# Patient Record
Sex: Female | Born: 1940
Health system: Southern US, Community
[De-identification: ages and names within clinical notes are randomized; demographics above are authoritative.]

## PROBLEM LIST (undated history)

## (undated) DIAGNOSIS — E119 Type 2 diabetes mellitus without complications: Secondary | ICD-10-CM

## (undated) DIAGNOSIS — I10 Essential (primary) hypertension: Secondary | ICD-10-CM

## (undated) HISTORY — PX: CATARACT EXTRACTION, BILATERAL: SHX1313

---

## 1999-07-09 ENCOUNTER — Ambulatory Visit: Admission: RE | Admit: 1999-07-09 | Discharge: 1999-07-09 | Payer: Self-pay | Admitting: Occupational Medicine

## 1999-07-09 ENCOUNTER — Encounter: Payer: Self-pay | Admitting: Occupational Medicine

## 2000-03-05 ENCOUNTER — Other Ambulatory Visit: Admission: RE | Admit: 2000-03-05 | Discharge: 2000-03-05 | Payer: Self-pay | Admitting: Obstetrics & Gynecology

## 2001-04-01 ENCOUNTER — Other Ambulatory Visit: Admission: RE | Admit: 2001-04-01 | Discharge: 2001-04-01 | Payer: Self-pay | Admitting: Obstetrics & Gynecology

## 2004-04-18 ENCOUNTER — Other Ambulatory Visit: Admission: RE | Admit: 2004-04-18 | Discharge: 2004-04-18 | Payer: Self-pay | Admitting: Obstetrics & Gynecology

## 2008-12-14 ENCOUNTER — Encounter: Admission: RE | Admit: 2008-12-14 | Discharge: 2008-12-14 | Payer: Self-pay | Admitting: Orthopedic Surgery

## 2009-01-11 ENCOUNTER — Ambulatory Visit (HOSPITAL_COMMUNITY): Admission: RE | Admit: 2009-01-11 | Discharge: 2009-01-11 | Payer: Self-pay | Admitting: Obstetrics and Gynecology

## 2009-01-11 ENCOUNTER — Encounter (INDEPENDENT_AMBULATORY_CARE_PROVIDER_SITE_OTHER): Payer: Self-pay | Admitting: Obstetrics and Gynecology

## 2010-04-19 ENCOUNTER — Encounter (INDEPENDENT_AMBULATORY_CARE_PROVIDER_SITE_OTHER): Payer: Self-pay | Admitting: *Deleted

## 2010-04-23 ENCOUNTER — Ambulatory Visit: Payer: Self-pay | Admitting: Internal Medicine

## 2010-04-23 ENCOUNTER — Encounter (INDEPENDENT_AMBULATORY_CARE_PROVIDER_SITE_OTHER): Payer: Self-pay | Admitting: *Deleted

## 2010-05-07 ENCOUNTER — Ambulatory Visit: Payer: Self-pay | Admitting: Internal Medicine

## 2010-05-08 ENCOUNTER — Encounter: Payer: Self-pay | Admitting: Internal Medicine

## 2010-08-20 NOTE — Letter (Signed)
Summary: Diabetic Instructions  [facility]  [address] [place], [place] [address]   Phone: [phone]  Fax: [phone]    Anna Cuevas 20-Feb-1941 MRN: [id number]   _  _   ORAL DIABETIC MEDICATION INSTRUCTIONS  The day before your procedure:   Take your diabetic pill as you do normally  The day of your procedure:   Do not take your diabetic pill    We will check your blood sugar levels during the admission process and again in Recovery before discharging you home  ________________________________________________________________________

## 2010-08-20 NOTE — Procedures (Signed)
Summary: Colonoscopy  Patient: [name] Note: All result statuses are Final unless otherwise noted.  Tests: (1) Colonoscopy (COL)   COL Colonoscopy           DONE     [facility]     [address]     [place], [place]  [address]           COLONOSCOPY PROCEDURE REPORT           PATIENT:  Anna, Cuevas  MR#:  [id number]     BIRTHDATE:  1941/01/19, 68 yrs. old  GENDER:  female     ENDOSCOPIST:  Hedwig Morton. Juanda Chance, MD     REF. BY:  Varney Baas, M.D.     PROCEDURE DATE:  05/07/2010     PROCEDURE:  Colonoscopy 29562     ASA CLASS:  Class I     INDICATIONS:  Routine Risk Screening     MEDICATIONS:   Versed 10 mg, Fentanyl 100 mcg           DESCRIPTION OF PROCEDURE:   After the risks benefits and     alternatives of the procedure were thoroughly explained, informed     consent was obtained.  Digital rectal exam was performed and     revealed no rectal masses.   The LB PCF-H180AL [id number] endoscope     was introduced through the anus and advanced to the cecum, which     was identified by both the appendix and ileocecal valve, without     limitations.  The quality of the prep was good, using MiraLax.     The instrument was then slowly withdrawn as the colon was fully     examined.     <<PROCEDUREIMAGES>>           FINDINGS:  A sessile polyp was found. 8 mm polyp at 30 cm Polyp     was snared without cautery. Retrieval was successful (see image3).     snare polyp  This was otherwise a normal examination of the colon     (see image4, image2, and image1).   Retroflexed views in the     rectum revealed no abnormalities.    The scope was then withdrawn     from the patient and the procedure completed.           COMPLICATIONS:  None     ENDOSCOPIC IMPRESSION:     1) Sessile polyp     2) Otherwise normal examination     s/p cold snare polypectomy     RECOMMENDATIONS:     1) Await pathology results     2) High fiber diet.     REPEAT EXAM:  No        ______________________________     Hedwig Morton. Juanda Chance, MD           CC:           n.     eSIGNED:   Hedwig Morton. Brodie at 05/07/2010 11:00 AM           [place], [id number]  Note: An exclamation mark (!) indicates a result that was not dispersed into the flowsheet. Document Creation Date: 05/07/2010 11:00 AM _______________________________________________________________________  (1) Order result status: Final Collection or observation date-time: 05/07/2010 10:51 Requested date-time:  Receipt date-time:  Reported date-time:  Referring Physician:   Ordering Physician: Lina Sar [phone]) Specimen Source:  Source: [name] Order Number: [id number] Lab site:   Appended Document: Colonoscopy  Correction: Recall colonoscopy in 5 years based on findings  of the adenomatous polyp.  Appended Document: Colonoscopy     Procedures Next Due Date:    Colonoscopy: 04/2015

## 2010-08-20 NOTE — Miscellaneous (Signed)
Summary: [facility]  Clinical Lists Changes  Medications: Added new medication of MIRALAX   POWD (POLYETHYLENE GLYCOL 3350) As per prep  instructions. - Signed Added new medication of DULCOLAX 5 MG  TBEC (BISACODYL) Day before procedure take 2 at 3pm and 2 at 8pm. - Signed Added new medication of REGLAN 10 MG  TABS (METOCLOPRAMIDE HCL) As per prep instructions. - Signed Rx of MIRALAX   POWD (POLYETHYLENE GLYCOL 3350) As per prep  instructions.;  #255gm x 0;  Signed;  Entered by: Ezra Sites RN;  Authorized by: Hart Carwin MD;  Method used: Electronically to [facility] [phone] [place], [place], [place]  [address], Ph: [phone], Fax: [phone] Rx of DULCOLAX 5 MG  TBEC (BISACODYL) Day before procedure take 2 at 3pm and 2 at 8pm.;  #4 x 0;  Signed;  Entered by: Ezra Sites RN;  Authorized by: Hart Carwin MD;  Method used: Electronically to [facility] [phone] [place], [place], [place]  [address], Ph: [phone], Fax: [phone] Rx of REGLAN 10 MG  TABS (METOCLOPRAMIDE HCL) As per prep instructions.;  #2 x 0;  Signed;  Entered by: Ezra Sites RN;  Authorized by: Hart Carwin MD;  Method used: Electronically to [facility] [phone] [place], [place], [place]  [address], Ph: [phone], Fax: [phone] Observations: Added new observation of NKA: T (04/23/2010 8:41)    Prescriptions: REGLAN 10 MG  TABS (METOCLOPRAMIDE HCL) As per prep instructions.  #2 x 0   Entered by:   Ezra Sites RN   Authorized by:   Hart Carwin MD   Signed by:   Ezra Sites RN on 04/23/2010   Method used:   Electronically to        [facility] [address] (retail)       [phone]       [place], [place]  [address]       Ph: [phone]       Fax: [phone]   RxID:   [id number] DULCOLAX 5 MG  TBEC (BISACODYL) Day before procedure take 2 at 3pm and 2 at 8pm.   #4 x 0   Entered by:   Ezra Sites RN   Authorized by:   Hart Carwin MD   Signed by:   Ezra Sites RN on 04/23/2010   Method used:   Electronically to        [facility] [address] (retail)       [phone]       [place], [place]  [address]       Ph: [phone]       Fax: [phone]   RxID:   [id number] MIRALAX   POWD (POLYETHYLENE GLYCOL 3350) As per prep  instructions.  #255gm x 0   Entered by:   Ezra Sites RN   Authorized by:   Hart Carwin MD   Signed by:   Ezra Sites RN on 04/23/2010   Method used:   Electronically to        [facility] [address] (retail)       [phone]       [place], [place]  [address]       Ph: [phone]       Fax: [phone]  RxID:   [id number]

## 2010-08-20 NOTE — Letter (Signed)
Summary: [facility] Instructions  [facility]  [address] [place], [place] [address]   Phone: [phone]  Fax: [phone]       Reannah HACKER    11-10-1940    MRN: [id number]       Procedure Day /Date:  Tuesday 05/07/2010     Arrival Time: 9:00 am     Procedure Time: 10:00 am     Location of Procedure:                    _x _  [facility] (4th Floor)    PREPARATION FOR COLONOSCOPY WITH MIRALAX  Starting 5 days prior to your procedure Thursday 10/13 do not eat nuts, seeds, popcorn, corn, beans, peas,  salads, or any raw vegetables.  Do not take any fiber supplements (e.g. Metamucil, Citrucel, and Benefiber). ____________________________________________________________________________________________________   THE DAY BEFORE YOUR PROCEDURE         DATE: Monday 10/17  1   Drink clear liquids the entire day-NO SOLID FOOD  2   Do not drink anything colored red or purple.  Avoid juices with pulp.  No orange juice.  3   Drink at least 64 oz. (8 glasses) of fluid/clear liquids during the day to prevent dehydration and help the prep work efficiently.  CLEAR LIQUIDS INCLUDE: Water Jello Ice Popsicles Tea (sugar ok, no milk/cream) Powdered fruit flavored drinks Coffee (sugar ok, no milk/cream) Gatorade Juice: apple, white grape, white cranberry  Lemonade Clear bullion, consomm, broth Carbonated beverages (any kind) Strained chicken noodle soup Hard Candy  4   Mix the entire bottle of Miralax with 64 oz. of Gatorade/Powerade in the morning and put in the refrigerator to chill.  5   At 3:00 pm take 2 Dulcolax/Bisacodyl tablets.  6   At 4:30 pm take one Reglan/Metoclopramide tablet.  7  Starting at 5:00 pm drink one 8 oz glass of the Miralax mixture every 15-20 minutes until you have finished drinking the entire 64 oz.  You should finish drinking prep around 7:30 or 8:00 pm.  8   If you are nauseated, you may take the 2nd Reglan/Metoclopramide  tablet at 6:30 pm.        9    At 8:00 pm take 2 more DULCOLAX/Bisacodyl tablets.     THE DAY OF YOUR PROCEDURE      DATE:  Tuesday 10/18  You may drink clear liquids until 8:00 am   (2 HOURS BEFORE PROCEDURE).   MEDICATION INSTRUCTIONS  Unless otherwise instructed, you should take regular prescription medications with a small sip of water as early as possible the morning of your procedure.  Diabetic patients - see separate instructions.   Additional medication instructions: Do not take Quinoretic/HCT day of procedure.         OTHER INSTRUCTIONS  You will need a responsible adult at least 71 years of age to accompany you and drive you home.   This person must remain in the waiting room during your procedure.  Wear loose fitting clothing that is easily removed.  Leave jewelry and other valuables at home.  However, you may wish to bring a book to read or an iPod/MP3 player to listen to music as you wait for your procedure to start.  Remove all body piercing jewelry and leave at home.  Total time from sign-in until discharge is approximately 2-3 hours.  You should go home directly after your procedure and rest.  You can resume normal activities  the day after your procedure.  The day of your procedure you should not:   Drive   Make legal decisions   Operate machinery   Drink alcohol   Return to work  You will receive specific instructions about eating, activities and medications before you leave.   The above instructions have been reviewed and explained to me by   Ezra Sites RN  April 23, 2010 9:31 AM    I fully understand and can verbalize these instructions _____________________________ Date _______

## 2010-08-20 NOTE — Letter (Signed)
Summary: Patient Notice- Polyp Results  [facility]  [address] [place], [place] [address]   Phone: [phone]  Fax: [phone]        May 08, 2010 MRN: [id number]    Anna Cuevas [facility] [address] [place], [place]  [address]    Dear [name],  I am pleased to inform you that the colon polyp(s) removed during your recent colonoscopy was (were) found to be benign (no cancer detected) upon pathologic examination.Your polyps was adenomatous ( precancerous).  I recommend you have a repeat colonoscopy examination in 5_ years to look for recurrent polyps, as having colon polyps increases your risk for having recurrent polyps or even colon cancer in the future.  Should you develop new or worsening symptoms of abdominal pain, bowel habit changes or bleeding from the rectum or bowels, please schedule an evaluation with either your primary care physician or with me.  Additional information/recommendations:  _x_ No further action with gastroenterology is needed at this time. Please      follow-up with your primary care physician for your other healthcare      needs.  __ Please call [phone] to schedule a return visit to review your      situation.  __ Please keep your follow-up visit as already scheduled.  __ Continue treatment plan as outlined the day of your exam.  Please call us if you are having persistent problems or have questions about your condition that have not been fully answered at this time.  Sincerely,  Hart Carwin MD  This letter has been electronically signed by your physician.  Appended Document: Patient Notice- Polyp Results letter mailed

## 2010-10-02 LAB — GLUCOSE, CAPILLARY
Glucose-Capillary: 123 mg/dL — ABNORMAL HIGH (ref 70–99)
Glucose-Capillary: 149 mg/dL — ABNORMAL HIGH (ref 70–99)

## 2014-03-03 ENCOUNTER — Encounter: Payer: Self-pay | Admitting: Internal Medicine

## 2015-04-23 ENCOUNTER — Other Ambulatory Visit: Payer: Self-pay | Admitting: Obstetrics and Gynecology

## 2015-04-24 LAB — CYTOLOGY - PAP

## 2015-05-09 ENCOUNTER — Encounter: Payer: Self-pay | Admitting: Gastroenterology

## 2015-08-17 DIAGNOSIS — M859 Disorder of bone density and structure, unspecified: Secondary | ICD-10-CM | POA: Diagnosis not present

## 2015-08-17 DIAGNOSIS — N183 Chronic kidney disease, stage 3 (moderate): Secondary | ICD-10-CM | POA: Diagnosis not present

## 2015-08-17 DIAGNOSIS — Z7984 Long term (current) use of oral hypoglycemic drugs: Secondary | ICD-10-CM | POA: Diagnosis not present

## 2015-08-17 DIAGNOSIS — E782 Mixed hyperlipidemia: Secondary | ICD-10-CM | POA: Diagnosis not present

## 2015-08-17 DIAGNOSIS — I10 Essential (primary) hypertension: Secondary | ICD-10-CM | POA: Diagnosis not present

## 2015-08-17 DIAGNOSIS — E6609 Other obesity due to excess calories: Secondary | ICD-10-CM | POA: Diagnosis not present

## 2015-08-17 DIAGNOSIS — E119 Type 2 diabetes mellitus without complications: Secondary | ICD-10-CM | POA: Diagnosis not present

## 2015-08-17 DIAGNOSIS — F3342 Major depressive disorder, recurrent, in full remission: Secondary | ICD-10-CM | POA: Diagnosis not present

## 2016-02-18 DIAGNOSIS — E782 Mixed hyperlipidemia: Secondary | ICD-10-CM | POA: Diagnosis not present

## 2016-02-18 DIAGNOSIS — N183 Chronic kidney disease, stage 3 (moderate): Secondary | ICD-10-CM | POA: Diagnosis not present

## 2016-02-18 DIAGNOSIS — Z86010 Personal history of colonic polyps: Secondary | ICD-10-CM | POA: Diagnosis not present

## 2016-02-18 DIAGNOSIS — Z6836 Body mass index (BMI) 36.0-36.9, adult: Secondary | ICD-10-CM | POA: Diagnosis not present

## 2016-02-18 DIAGNOSIS — F3342 Major depressive disorder, recurrent, in full remission: Secondary | ICD-10-CM | POA: Diagnosis not present

## 2016-02-18 DIAGNOSIS — I10 Essential (primary) hypertension: Secondary | ICD-10-CM | POA: Diagnosis not present

## 2016-02-18 DIAGNOSIS — E119 Type 2 diabetes mellitus without complications: Secondary | ICD-10-CM | POA: Diagnosis not present

## 2016-02-18 DIAGNOSIS — E6609 Other obesity due to excess calories: Secondary | ICD-10-CM | POA: Diagnosis not present

## 2016-02-18 DIAGNOSIS — Z7984 Long term (current) use of oral hypoglycemic drugs: Secondary | ICD-10-CM | POA: Diagnosis not present

## 2016-05-09 DIAGNOSIS — R531 Weakness: Secondary | ICD-10-CM | POA: Diagnosis not present

## 2016-05-09 DIAGNOSIS — R42 Dizziness and giddiness: Secondary | ICD-10-CM | POA: Diagnosis not present

## 2016-05-09 DIAGNOSIS — J329 Chronic sinusitis, unspecified: Secondary | ICD-10-CM | POA: Diagnosis not present

## 2016-05-12 DIAGNOSIS — R899 Unspecified abnormal finding in specimens from other organs, systems and tissues: Secondary | ICD-10-CM | POA: Diagnosis not present

## 2016-06-19 DIAGNOSIS — M5441 Lumbago with sciatica, right side: Secondary | ICD-10-CM | POA: Diagnosis not present

## 2016-06-27 DIAGNOSIS — M5441 Lumbago with sciatica, right side: Secondary | ICD-10-CM | POA: Diagnosis not present

## 2016-07-09 DIAGNOSIS — M5441 Lumbago with sciatica, right side: Secondary | ICD-10-CM | POA: Diagnosis not present

## 2016-07-29 DIAGNOSIS — F4320 Adjustment disorder, unspecified: Secondary | ICD-10-CM | POA: Diagnosis not present

## 2016-07-29 DIAGNOSIS — F3342 Major depressive disorder, recurrent, in full remission: Secondary | ICD-10-CM | POA: Diagnosis not present

## 2016-07-30 DIAGNOSIS — E119 Type 2 diabetes mellitus without complications: Secondary | ICD-10-CM | POA: Diagnosis not present

## 2016-07-30 DIAGNOSIS — H26492 Other secondary cataract, left eye: Secondary | ICD-10-CM | POA: Diagnosis not present

## 2016-07-30 DIAGNOSIS — H02831 Dermatochalasis of right upper eyelid: Secondary | ICD-10-CM | POA: Diagnosis not present

## 2016-07-30 DIAGNOSIS — H1851 Endothelial corneal dystrophy: Secondary | ICD-10-CM | POA: Diagnosis not present

## 2016-08-22 DIAGNOSIS — M5441 Lumbago with sciatica, right side: Secondary | ICD-10-CM | POA: Diagnosis not present

## 2016-09-01 DIAGNOSIS — M47816 Spondylosis without myelopathy or radiculopathy, lumbar region: Secondary | ICD-10-CM | POA: Diagnosis not present

## 2016-09-01 DIAGNOSIS — M533 Sacrococcygeal disorders, not elsewhere classified: Secondary | ICD-10-CM | POA: Diagnosis not present

## 2016-09-16 DIAGNOSIS — M533 Sacrococcygeal disorders, not elsewhere classified: Secondary | ICD-10-CM | POA: Diagnosis not present

## 2016-09-16 DIAGNOSIS — M47816 Spondylosis without myelopathy or radiculopathy, lumbar region: Secondary | ICD-10-CM | POA: Diagnosis not present

## 2016-10-06 DIAGNOSIS — Z79899 Other long term (current) drug therapy: Secondary | ICD-10-CM | POA: Diagnosis not present

## 2016-10-06 DIAGNOSIS — E119 Type 2 diabetes mellitus without complications: Secondary | ICD-10-CM | POA: Diagnosis not present

## 2016-10-06 DIAGNOSIS — R899 Unspecified abnormal finding in specimens from other organs, systems and tissues: Secondary | ICD-10-CM | POA: Diagnosis not present

## 2016-10-06 DIAGNOSIS — Z7984 Long term (current) use of oral hypoglycemic drugs: Secondary | ICD-10-CM | POA: Diagnosis not present

## 2016-10-06 DIAGNOSIS — N183 Chronic kidney disease, stage 3 (moderate): Secondary | ICD-10-CM | POA: Diagnosis not present

## 2016-10-06 DIAGNOSIS — E782 Mixed hyperlipidemia: Secondary | ICD-10-CM | POA: Diagnosis not present

## 2016-10-07 DIAGNOSIS — Z79899 Other long term (current) drug therapy: Secondary | ICD-10-CM | POA: Diagnosis not present

## 2016-10-07 DIAGNOSIS — F3342 Major depressive disorder, recurrent, in full remission: Secondary | ICD-10-CM | POA: Diagnosis not present

## 2016-10-07 DIAGNOSIS — E6609 Other obesity due to excess calories: Secondary | ICD-10-CM | POA: Diagnosis not present

## 2016-10-07 DIAGNOSIS — E119 Type 2 diabetes mellitus without complications: Secondary | ICD-10-CM | POA: Diagnosis not present

## 2016-10-07 DIAGNOSIS — N183 Chronic kidney disease, stage 3 (moderate): Secondary | ICD-10-CM | POA: Diagnosis not present

## 2016-10-07 DIAGNOSIS — Z7984 Long term (current) use of oral hypoglycemic drugs: Secondary | ICD-10-CM | POA: Diagnosis not present

## 2016-10-07 DIAGNOSIS — E782 Mixed hyperlipidemia: Secondary | ICD-10-CM | POA: Diagnosis not present

## 2016-10-07 DIAGNOSIS — Z6837 Body mass index (BMI) 37.0-37.9, adult: Secondary | ICD-10-CM | POA: Diagnosis not present

## 2016-10-14 DIAGNOSIS — M533 Sacrococcygeal disorders, not elsewhere classified: Secondary | ICD-10-CM | POA: Diagnosis not present

## 2016-10-14 DIAGNOSIS — M47816 Spondylosis without myelopathy or radiculopathy, lumbar region: Secondary | ICD-10-CM | POA: Diagnosis not present

## 2016-10-29 DIAGNOSIS — M47816 Spondylosis without myelopathy or radiculopathy, lumbar region: Secondary | ICD-10-CM | POA: Diagnosis not present

## 2016-11-11 DIAGNOSIS — M47816 Spondylosis without myelopathy or radiculopathy, lumbar region: Secondary | ICD-10-CM | POA: Diagnosis not present

## 2016-11-11 DIAGNOSIS — M47818 Spondylosis without myelopathy or radiculopathy, sacral and sacrococcygeal region: Secondary | ICD-10-CM | POA: Diagnosis not present

## 2016-12-05 DIAGNOSIS — M47816 Spondylosis without myelopathy or radiculopathy, lumbar region: Secondary | ICD-10-CM | POA: Diagnosis not present

## 2016-12-05 DIAGNOSIS — M47818 Spondylosis without myelopathy or radiculopathy, sacral and sacrococcygeal region: Secondary | ICD-10-CM | POA: Diagnosis not present

## 2017-01-02 DIAGNOSIS — Z7984 Long term (current) use of oral hypoglycemic drugs: Secondary | ICD-10-CM | POA: Diagnosis not present

## 2017-01-02 DIAGNOSIS — H6122 Impacted cerumen, left ear: Secondary | ICD-10-CM | POA: Diagnosis not present

## 2017-01-02 DIAGNOSIS — N183 Chronic kidney disease, stage 3 (moderate): Secondary | ICD-10-CM | POA: Diagnosis not present

## 2017-01-02 DIAGNOSIS — E119 Type 2 diabetes mellitus without complications: Secondary | ICD-10-CM | POA: Diagnosis not present

## 2017-01-02 DIAGNOSIS — H6120 Impacted cerumen, unspecified ear: Secondary | ICD-10-CM | POA: Diagnosis not present

## 2017-01-02 DIAGNOSIS — F3342 Major depressive disorder, recurrent, in full remission: Secondary | ICD-10-CM | POA: Diagnosis not present

## 2017-01-02 DIAGNOSIS — E6609 Other obesity due to excess calories: Secondary | ICD-10-CM | POA: Diagnosis not present

## 2017-01-02 DIAGNOSIS — E782 Mixed hyperlipidemia: Secondary | ICD-10-CM | POA: Diagnosis not present

## 2017-01-02 DIAGNOSIS — Z79899 Other long term (current) drug therapy: Secondary | ICD-10-CM | POA: Diagnosis not present

## 2017-01-12 DIAGNOSIS — R29898 Other symptoms and signs involving the musculoskeletal system: Secondary | ICD-10-CM | POA: Diagnosis not present

## 2017-01-16 DIAGNOSIS — R29898 Other symptoms and signs involving the musculoskeletal system: Secondary | ICD-10-CM | POA: Diagnosis not present

## 2017-01-20 DIAGNOSIS — R29898 Other symptoms and signs involving the musculoskeletal system: Secondary | ICD-10-CM | POA: Diagnosis not present

## 2017-01-23 DIAGNOSIS — R29898 Other symptoms and signs involving the musculoskeletal system: Secondary | ICD-10-CM | POA: Diagnosis not present

## 2017-01-27 DIAGNOSIS — R29898 Other symptoms and signs involving the musculoskeletal system: Secondary | ICD-10-CM | POA: Diagnosis not present

## 2017-01-29 DIAGNOSIS — R29898 Other symptoms and signs involving the musculoskeletal system: Secondary | ICD-10-CM | POA: Diagnosis not present

## 2017-02-10 DIAGNOSIS — R29898 Other symptoms and signs involving the musculoskeletal system: Secondary | ICD-10-CM | POA: Diagnosis not present

## 2017-02-13 DIAGNOSIS — R29898 Other symptoms and signs involving the musculoskeletal system: Secondary | ICD-10-CM | POA: Diagnosis not present

## 2017-02-17 DIAGNOSIS — R29898 Other symptoms and signs involving the musculoskeletal system: Secondary | ICD-10-CM | POA: Diagnosis not present

## 2017-02-20 DIAGNOSIS — R29898 Other symptoms and signs involving the musculoskeletal system: Secondary | ICD-10-CM | POA: Diagnosis not present

## 2017-02-25 DIAGNOSIS — R29898 Other symptoms and signs involving the musculoskeletal system: Secondary | ICD-10-CM | POA: Diagnosis not present

## 2017-02-27 DIAGNOSIS — R29898 Other symptoms and signs involving the musculoskeletal system: Secondary | ICD-10-CM | POA: Diagnosis not present

## 2017-03-02 DIAGNOSIS — R29898 Other symptoms and signs involving the musculoskeletal system: Secondary | ICD-10-CM | POA: Diagnosis not present

## 2017-03-04 DIAGNOSIS — R29898 Other symptoms and signs involving the musculoskeletal system: Secondary | ICD-10-CM | POA: Diagnosis not present

## 2017-04-14 DIAGNOSIS — E6609 Other obesity due to excess calories: Secondary | ICD-10-CM | POA: Diagnosis not present

## 2017-04-14 DIAGNOSIS — N183 Chronic kidney disease, stage 3 (moderate): Secondary | ICD-10-CM | POA: Diagnosis not present

## 2017-04-14 DIAGNOSIS — Z6836 Body mass index (BMI) 36.0-36.9, adult: Secondary | ICD-10-CM | POA: Diagnosis not present

## 2017-04-14 DIAGNOSIS — I10 Essential (primary) hypertension: Secondary | ICD-10-CM | POA: Diagnosis not present

## 2017-04-14 DIAGNOSIS — E119 Type 2 diabetes mellitus without complications: Secondary | ICD-10-CM | POA: Diagnosis not present

## 2017-04-14 DIAGNOSIS — Z7984 Long term (current) use of oral hypoglycemic drugs: Secondary | ICD-10-CM | POA: Diagnosis not present

## 2017-04-14 DIAGNOSIS — F3342 Major depressive disorder, recurrent, in full remission: Secondary | ICD-10-CM | POA: Diagnosis not present

## 2017-04-14 DIAGNOSIS — E782 Mixed hyperlipidemia: Secondary | ICD-10-CM | POA: Diagnosis not present

## 2017-04-14 DIAGNOSIS — Z23 Encounter for immunization: Secondary | ICD-10-CM | POA: Diagnosis not present

## 2017-08-03 DIAGNOSIS — M47816 Spondylosis without myelopathy or radiculopathy, lumbar region: Secondary | ICD-10-CM | POA: Diagnosis not present

## 2017-08-18 DIAGNOSIS — M47818 Spondylosis without myelopathy or radiculopathy, sacral and sacrococcygeal region: Secondary | ICD-10-CM | POA: Diagnosis not present

## 2017-08-18 DIAGNOSIS — M47816 Spondylosis without myelopathy or radiculopathy, lumbar region: Secondary | ICD-10-CM | POA: Diagnosis not present

## 2017-09-15 DIAGNOSIS — M47818 Spondylosis without myelopathy or radiculopathy, sacral and sacrococcygeal region: Secondary | ICD-10-CM | POA: Diagnosis not present

## 2017-09-15 DIAGNOSIS — M47816 Spondylosis without myelopathy or radiculopathy, lumbar region: Secondary | ICD-10-CM | POA: Diagnosis not present

## 2017-10-15 ENCOUNTER — Other Ambulatory Visit: Payer: Self-pay

## 2017-10-15 ENCOUNTER — Inpatient Hospital Stay (HOSPITAL_COMMUNITY): Payer: PPO

## 2017-10-15 ENCOUNTER — Emergency Department (HOSPITAL_COMMUNITY): Payer: PPO

## 2017-10-15 ENCOUNTER — Inpatient Hospital Stay (HOSPITAL_COMMUNITY)
Admission: EM | Admit: 2017-10-15 | Discharge: 2017-10-20 | DRG: 309 | Disposition: A | Discharge status: Home Health | Payer: PPO | Attending: Family Medicine | Admitting: Family Medicine

## 2017-10-15 ENCOUNTER — Other Ambulatory Visit (HOSPITAL_COMMUNITY): Payer: PPO

## 2017-10-15 ENCOUNTER — Encounter (HOSPITAL_COMMUNITY): Payer: Self-pay | Admitting: Emergency Medicine

## 2017-10-15 DIAGNOSIS — R4182 Altered mental status, unspecified: Secondary | ICD-10-CM | POA: Diagnosis not present

## 2017-10-15 DIAGNOSIS — L89151 Pressure ulcer of sacral region, stage 1: Secondary | ICD-10-CM | POA: Diagnosis not present

## 2017-10-15 DIAGNOSIS — R112 Nausea with vomiting, unspecified: Secondary | ICD-10-CM | POA: Diagnosis not present

## 2017-10-15 DIAGNOSIS — Z7982 Long term (current) use of aspirin: Secondary | ICD-10-CM

## 2017-10-15 DIAGNOSIS — R339 Retention of urine, unspecified: Secondary | ICD-10-CM | POA: Diagnosis not present

## 2017-10-15 DIAGNOSIS — L8990 Pressure ulcer of unspecified site, unspecified stage: Secondary | ICD-10-CM

## 2017-10-15 DIAGNOSIS — R531 Weakness: Secondary | ICD-10-CM | POA: Diagnosis not present

## 2017-10-15 DIAGNOSIS — I4891 Unspecified atrial fibrillation: Principal | ICD-10-CM | POA: Diagnosis present

## 2017-10-15 DIAGNOSIS — E119 Type 2 diabetes mellitus without complications: Secondary | ICD-10-CM

## 2017-10-15 DIAGNOSIS — L89322 Pressure ulcer of left buttock, stage 2: Secondary | ICD-10-CM | POA: Diagnosis not present

## 2017-10-15 DIAGNOSIS — E860 Dehydration: Secondary | ICD-10-CM | POA: Diagnosis not present

## 2017-10-15 DIAGNOSIS — N179 Acute kidney failure, unspecified: Secondary | ICD-10-CM | POA: Diagnosis not present

## 2017-10-15 DIAGNOSIS — D649 Anemia, unspecified: Secondary | ICD-10-CM | POA: Diagnosis not present

## 2017-10-15 DIAGNOSIS — I361 Nonrheumatic tricuspid (valve) insufficiency: Secondary | ICD-10-CM | POA: Diagnosis not present

## 2017-10-15 DIAGNOSIS — F329 Major depressive disorder, single episode, unspecified: Secondary | ICD-10-CM | POA: Diagnosis not present

## 2017-10-15 DIAGNOSIS — Z7984 Long term (current) use of oral hypoglycemic drugs: Secondary | ICD-10-CM | POA: Diagnosis not present

## 2017-10-15 DIAGNOSIS — R404 Transient alteration of awareness: Secondary | ICD-10-CM | POA: Diagnosis not present

## 2017-10-15 DIAGNOSIS — E871 Hypo-osmolality and hyponatremia: Secondary | ICD-10-CM | POA: Diagnosis present

## 2017-10-15 DIAGNOSIS — Z79899 Other long term (current) drug therapy: Secondary | ICD-10-CM | POA: Diagnosis not present

## 2017-10-15 DIAGNOSIS — R197 Diarrhea, unspecified: Secondary | ICD-10-CM | POA: Diagnosis not present

## 2017-10-15 DIAGNOSIS — I10 Essential (primary) hypertension: Secondary | ICD-10-CM | POA: Diagnosis not present

## 2017-10-15 HISTORY — DX: Type 2 diabetes mellitus without complications: E11.9

## 2017-10-15 HISTORY — DX: Essential (primary) hypertension: I10

## 2017-10-15 LAB — CBC WITH DIFFERENTIAL/PLATELET
BASOS PCT: 0 %
Basophils Absolute: 0 10*3/uL (ref 0.0–0.1)
Eosinophils Absolute: 0.1 10*3/uL (ref 0.0–0.7)
Eosinophils Relative: 0 %
HEMATOCRIT: 26.6 % — AB (ref 36.0–46.0)
Hemoglobin: 9 g/dL — ABNORMAL LOW (ref 12.0–15.0)
Lymphocytes Relative: 7 %
Lymphs Abs: 1.1 10*3/uL (ref 0.7–4.0)
MCH: 28 pg (ref 26.0–34.0)
MCHC: 33.8 g/dL (ref 30.0–36.0)
MCV: 82.6 fL (ref 78.0–100.0)
MONO ABS: 0.8 10*3/uL (ref 0.1–1.0)
MONOS PCT: 4 %
NEUTROS ABS: 15.4 10*3/uL — AB (ref 1.7–7.7)
Neutrophils Relative %: 89 %
Platelets: 488 10*3/uL — ABNORMAL HIGH (ref 150–400)
RBC: 3.22 MIL/uL — ABNORMAL LOW (ref 3.87–5.11)
RDW: 13.9 % (ref 11.5–15.5)
WBC: 17.3 10*3/uL — ABNORMAL HIGH (ref 4.0–10.5)

## 2017-10-15 LAB — VITAMIN B12: VITAMIN B 12: 997 pg/mL — AB (ref 180–914)

## 2017-10-15 LAB — RETICULOCYTES
RBC.: 3.1 MIL/uL — AB (ref 3.87–5.11)
RETIC COUNT ABSOLUTE: 52.7 10*3/uL (ref 19.0–186.0)
Retic Ct Pct: 1.7 % (ref 0.4–3.1)

## 2017-10-15 LAB — COMPREHENSIVE METABOLIC PANEL
ALK PHOS: 133 U/L — AB (ref 38–126)
ALT: 38 U/L (ref 14–54)
AST: 32 U/L (ref 15–41)
Albumin: 2.9 g/dL — ABNORMAL LOW (ref 3.5–5.0)
Anion gap: 13 (ref 5–15)
BILIRUBIN TOTAL: 0.5 mg/dL (ref 0.3–1.2)
BUN: 92 mg/dL — AB (ref 6–20)
CO2: 27 mmol/L (ref 22–32)
Calcium: 8.3 mg/dL — ABNORMAL LOW (ref 8.9–10.3)
Chloride: 84 mmol/L — ABNORMAL LOW (ref 101–111)
Creatinine, Ser: 1.43 mg/dL — ABNORMAL HIGH (ref 0.44–1.00)
GFR calc Af Amer: 40 mL/min — ABNORMAL LOW (ref >60)
GFR, EST NON AFRICAN AMERICAN: 34 mL/min — AB (ref >60)
Glucose, Bld: 314 mg/dL — ABNORMAL HIGH (ref 65–99)
Potassium: 3.6 mmol/L (ref 3.5–5.1)
Sodium: 124 mmol/L — ABNORMAL LOW (ref 135–145)
Total Protein: 6.1 g/dL — ABNORMAL LOW (ref 6.5–8.1)

## 2017-10-15 LAB — PHOSPHORUS: PHOSPHORUS: 2.9 mg/dL (ref 2.5–4.6)

## 2017-10-15 LAB — BRAIN NATRIURETIC PEPTIDE: B Natriuretic Peptide: 155.3 pg/mL — ABNORMAL HIGH (ref 0.0–100.0)

## 2017-10-15 LAB — URINALYSIS, ROUTINE W REFLEX MICROSCOPIC
Bilirubin Urine: NEGATIVE
GLUCOSE, UA: NEGATIVE mg/dL
HGB URINE DIPSTICK: NEGATIVE
Ketones, ur: NEGATIVE mg/dL
NITRITE: NEGATIVE
PH: 5 (ref 5.0–8.0)
Protein, ur: NEGATIVE mg/dL
Specific Gravity, Urine: 1.015 (ref 1.005–1.030)

## 2017-10-15 LAB — HEMOGLOBIN AND HEMATOCRIT, BLOOD
HEMATOCRIT: 23.7 % — AB (ref 36.0–46.0)
HEMOGLOBIN: 7.9 g/dL — AB (ref 12.0–15.0)

## 2017-10-15 LAB — I-STAT TROPONIN, ED: Troponin i, poc: 0.02 ng/mL (ref 0.00–0.08)

## 2017-10-15 LAB — I-STAT CHEM 8, ED
BUN: 92 mg/dL — ABNORMAL HIGH (ref 6–20)
CALCIUM ION: 1.07 mmol/L — AB (ref 1.15–1.40)
CHLORIDE: 82 mmol/L — AB (ref 101–111)
Creatinine, Ser: 1.4 mg/dL — ABNORMAL HIGH (ref 0.44–1.00)
Glucose, Bld: 306 mg/dL — ABNORMAL HIGH (ref 65–99)
HCT: 27 % — ABNORMAL LOW (ref 36.0–46.0)
Hemoglobin: 9.2 g/dL — ABNORMAL LOW (ref 12.0–15.0)
POTASSIUM: 3.6 mmol/L (ref 3.5–5.1)
SODIUM: 124 mmol/L — AB (ref 135–145)
TCO2: 29 mmol/L (ref 22–32)

## 2017-10-15 LAB — BASIC METABOLIC PANEL
ANION GAP: 13 (ref 5–15)
BUN: 78 mg/dL — ABNORMAL HIGH (ref 6–20)
CALCIUM: 8.5 mg/dL — AB (ref 8.9–10.3)
CO2: 25 mmol/L (ref 22–32)
Chloride: 90 mmol/L — ABNORMAL LOW (ref 101–111)
Creatinine, Ser: 1.12 mg/dL — ABNORMAL HIGH (ref 0.44–1.00)
GFR, EST AFRICAN AMERICAN: 53 mL/min — AB (ref >60)
GFR, EST NON AFRICAN AMERICAN: 46 mL/min — AB (ref >60)
GLUCOSE: 209 mg/dL — AB (ref 65–99)
POTASSIUM: 3.4 mmol/L — AB (ref 3.5–5.1)
Sodium: 128 mmol/L — ABNORMAL LOW (ref 135–145)

## 2017-10-15 LAB — FERRITIN: Ferritin: 216 ng/mL (ref 11–307)

## 2017-10-15 LAB — IRON AND TIBC
IRON: 35 ug/dL (ref 28–170)
Saturation Ratios: 13 % (ref 10.4–31.8)
TIBC: 263 ug/dL (ref 250–450)
UIBC: 228 ug/dL

## 2017-10-15 LAB — FOLATE: Folate: 29 ng/mL (ref >5.9)

## 2017-10-15 LAB — CBG MONITORING, ED
GLUCOSE-CAPILLARY: 152 mg/dL — AB (ref 65–99)
GLUCOSE-CAPILLARY: 200 mg/dL — AB (ref 65–99)

## 2017-10-15 LAB — TYPE AND SCREEN
ABO/RH(D): O POS
Antibody Screen: NEGATIVE

## 2017-10-15 LAB — POC OCCULT BLOOD, ED: FECAL OCCULT BLD: NEGATIVE

## 2017-10-15 LAB — LIPASE, BLOOD: LIPASE: 61 U/L — AB (ref 11–51)

## 2017-10-15 LAB — TROPONIN I: Troponin I: <0.03 ng/mL (ref <0.03)

## 2017-10-15 LAB — I-STAT CG4 LACTIC ACID, ED: Lactic Acid, Venous: 1.18 mmol/L (ref 0.5–1.9)

## 2017-10-15 LAB — MAGNESIUM: Magnesium: 1.7 mg/dL (ref 1.7–2.4)

## 2017-10-15 LAB — ABO/RH: ABO/RH(D): O POS

## 2017-10-15 LAB — PROTIME-INR
INR: 1.04
Prothrombin Time: 13.5 seconds (ref 11.4–15.2)

## 2017-10-15 MED ORDER — SODIUM CHLORIDE 0.9 % IV SOLN
1.0000 g | Freq: Once | INTRAVENOUS | Status: AC
  Administered 2017-10-15: 1 g via INTRAVENOUS
  Filled 2017-10-15: qty 10

## 2017-10-15 MED ORDER — SODIUM CHLORIDE 0.9 % IV SOLN
Freq: Once | INTRAVENOUS | Status: AC
  Administered 2017-10-15: 1000 mL via INTRAVENOUS

## 2017-10-15 MED ORDER — SERTRALINE HCL 100 MG PO TABS
200.0000 mg | ORAL_TABLET | Freq: Every day | ORAL | Status: DC
  Administered 2017-10-16 – 2017-10-20 (×5): 200 mg via ORAL
  Filled 2017-10-15 (×5): qty 2

## 2017-10-15 MED ORDER — ONDANSETRON HCL 4 MG PO TABS: 4.0000 mg | ORAL_TABLET | Freq: Four times a day (QID) | ORAL | Status: DC | PRN

## 2017-10-15 MED ORDER — HYDRALAZINE HCL 20 MG/ML IJ SOLN: 10.0000 mg | Freq: Three times a day (TID) | INTRAMUSCULAR | Status: DC | PRN

## 2017-10-15 MED ORDER — DILTIAZEM HCL 100 MG IV SOLR
5.0000 mg/h | INTRAVENOUS | Status: DC
  Administered 2017-10-15 – 2017-10-16 (×2): 5 mg/h via INTRAVENOUS
  Administered 2017-10-17: 15 mg/h via INTRAVENOUS
  Administered 2017-10-17: 14 mg/h via INTRAVENOUS
  Administered 2017-10-17: 15 mg/h via INTRAVENOUS
  Filled 2017-10-15 (×8): qty 100

## 2017-10-15 MED ORDER — ONDANSETRON HCL 4 MG/2ML IJ SOLN: 4.0000 mg | Freq: Four times a day (QID) | INTRAMUSCULAR | Status: DC | PRN

## 2017-10-15 MED ORDER — INSULIN ASPART 100 UNIT/ML ~~LOC~~ SOLN
0.0000 [IU] | Freq: Three times a day (TID) | SUBCUTANEOUS | Status: DC
  Administered 2017-10-15 – 2017-10-16 (×2): 3 [IU] via SUBCUTANEOUS
  Administered 2017-10-16: 8 [IU] via SUBCUTANEOUS
  Administered 2017-10-17: 3 [IU] via SUBCUTANEOUS
  Administered 2017-10-17 – 2017-10-18 (×3): 5 [IU] via SUBCUTANEOUS
  Administered 2017-10-18: 3 [IU] via SUBCUTANEOUS
  Administered 2017-10-18: 8 [IU] via SUBCUTANEOUS
  Administered 2017-10-19 – 2017-10-20 (×4): 5 [IU] via SUBCUTANEOUS
  Administered 2017-10-20: 8 [IU] via SUBCUTANEOUS
  Administered 2017-10-20: 5 [IU] via SUBCUTANEOUS
  Filled 2017-10-15 (×2): qty 1

## 2017-10-15 MED ORDER — AMLODIPINE BESYLATE 10 MG PO TABS: 10.0000 mg | ORAL_TABLET | Freq: Every day | ORAL | Status: DC

## 2017-10-15 MED ORDER — ACETAMINOPHEN 325 MG PO TABS
650.0000 mg | ORAL_TABLET | Freq: Four times a day (QID) | ORAL | Status: DC | PRN
  Administered 2017-10-19: 650 mg via ORAL
  Filled 2017-10-15: qty 2

## 2017-10-15 MED ORDER — SODIUM CHLORIDE 0.9 % IV BOLUS
500.0000 mL | Freq: Once | INTRAVENOUS | Status: AC
  Administered 2017-10-15: 1000 mL via INTRAVENOUS

## 2017-10-15 MED ORDER — ACETAMINOPHEN 650 MG RE SUPP: 650.0000 mg | Freq: Four times a day (QID) | RECTAL | Status: DC | PRN

## 2017-10-15 MED ORDER — SALINE SPRAY 0.65 % NA SOLN
2.0000 | Freq: Three times a day (TID) | NASAL | Status: DC
  Administered 2017-10-19 – 2017-10-20 (×3): 2 via NASAL
  Filled 2017-10-15 (×2): qty 44

## 2017-10-15 NOTE — ED Triage Notes (Signed)
To ED via [facility] from home, with family stating that pt has been vomiting and having diarrhea since last week- now is very weak- too weak to walk with walker. -  On ems arrival to home, pt was found to be in [place] with rate from 120-160-- IV started- received 1000cc bolus, metoprolol 15mg  IV -- rate on arrival to ED is still 10's-120's-- remains in AFib.

## 2017-10-15 NOTE — ED Notes (Signed)
Patient transported to CT 

## 2017-10-15 NOTE — ED Notes (Signed)
Meal tray ordered 

## 2017-10-15 NOTE — ED Notes (Signed)
Pt given gingerale and crackers 

## 2017-10-15 NOTE — H&P (Signed)
[facility] History and Physical  Anna Cuevas PIR:[id number] DOB: 05-06-1941 DOA: 10/15/2017  Referring physician:  PCP: [name], MD   Chief Complaint: "I was weak this morning."  HPI: Anna Cuevas is a 77 y.o. female history significant for diabetes and hypertension.  Per patient and her daughter she is been feeling off for the last 2 weeks.  Worse over the last week.  Anna Cuevas was sick with a GI bug.  Patient has had at least 2 days worth of nausea vomiting diarrhea.  Patient states that she mainly vomits when she lays on her right side and vomiting is posttussive.  Nonbloody nonbilious.  States she is compliant with all her medications.  States this morning she was weaker than other days.  She may try to keep up with fluids.  She states she felt so weak she had trouble moving her legs.  Daughter helped her to the bathroom.  Patient states she does remember a whole lot after that.  Daughter says the patient remained awake but her responses were not normal.  [facility] no brought patient to the emergency room for evaluation.  ED course: Patient found to be in A. fib with RVR.  In route she received a total of 15 mg of Lopressor and 1 L bolus of normal saline.  In the emergency room she received another 500 cc bolus.  Troponin negative.  Chest x-ray negative for abnormality.  Hospitalist consulted for admission.   Review of Systems:  As per HPI otherwise 10 point review of systems negative.    Past Medical History:  Diagnosis Date  . Diabetes mellitus without complication ([place])   . Hypertension    History reviewed. No pertinent surgical history. Social History:  reports that she has never smoked. She has never used smokeless tobacco. She reports that she does not drink alcohol or use drugs.  Not on File  No family history on file.   Prior to Admission medications   Medication Sig Start Date End Date Taking? Authorizing Provider  amLODipine (NORVASC) 10 MG tablet Take 10  mg by mouth daily. 08/09/17  Yes [provider]  aspirin EC 81 MG tablet Take 81 mg by mouth daily.   Yes [provider]  BISACODYL LAXATIVE PO Take 1 tablet by mouth as needed (constipation).   Yes [provider]  docusate sodium (COLACE) 100 MG capsule Take 200 mg by mouth as needed for mild constipation.   Yes [provider]  lisinopril-hydrochlorothiazide (PRINZIDE,ZESTORETIC) 20-12.5 MG tablet Take 2 tablets by mouth daily. 10/07/17  Yes [provider]  Menthol-Methyl Salicylate (ICY HOT) 63-01 % STCK Apply 1 application topically as needed (Back and Neck pain).   Yes [provider]  metFORMIN (GLUCOPHAGE) 500 MG tablet Take 500 mg by mouth daily. 10/14/17  Yes [provider]  sertraline (ZOLOFT) 100 MG tablet Take 200 mg by mouth daily. 08/10/17  Yes [provider]   Physical Exam: Vitals:   10/15/17 1130 10/15/17 1140 10/15/17 1230 10/15/17 1245  BP: (!) 82/56 100/77 (!) 88/77 (!) 95/55  Pulse: (!) 114 (!) 141 (!) 123 98  Resp: (!) 24 20 10 17   Temp:      TempSrc:      SpO2: 98% 100% 97% 98%  Weight:      Height:        Wt Readings from Last 3 Encounters:  10/15/17 88.5 kg (195 lb)    General:  Appears calm and comfortable; A&Ox3  Eyes:  PERRL, EOMI, normal lids, iris ENT:  grossly normal hearing, lips & tongue Neck:  no LAD, masses or thyromegaly Cardiovascular:  IRR IRR, no m/r/g. No LE edema.  Respiratory:  CTA bilaterally, no w/r/r. Normal respiratory effort. Abdomen:  soft, ntnd Skin:  no rash or induration seen on limited exam Musculoskeletal:  grossly normal tone BUE/BLE Psychiatric:  grossly normal mood and affect, speech fluent and appropriate Neurologic:  CN 2-12 grossly intact, moves all extremities in coordinated fashion.          Labs on Admission:  Basic Metabolic Panel: Recent Labs  Lab 10/15/17 1110 10/15/17 1140  NA 124* 124*  K 3.6 3.6  CL 84* 82*  CO2 27  --   GLUCOSE  314* 306*  BUN 92* 92*  CREATININE 1.43* 1.40*  CALCIUM 8.3*  --    Liver Function Tests: Recent Labs  Lab 10/15/17 1110  AST 32  ALT 38  ALKPHOS 133*  BILITOT 0.5  PROT 6.1*  ALBUMIN 2.9*   Recent Labs  Lab 10/15/17 1110  LIPASE 61*   No results for input(s): AMMONIA in the last 168 hours. CBC: Recent Labs  Lab 10/15/17 1110 10/15/17 1140  WBC 17.3*  --   NEUTROABS 15.4*  --   HGB 9.0* 9.2*  HCT 26.6* 27.0*  MCV 82.6  --   PLT 488*  --    Cardiac Enzymes: No results for input(s): CKTOTAL, CKMB, CKMBINDEX, TROPONINI in the last 168 hours.  BNP (last 3 results) Recent Labs    10/15/17 1110  BNP 155.3*    ProBNP (last 3 results) No results for input(s): PROBNP in the last 8760 hours.   Serum creatinine: 1.4 mg/dL (H) 10/15/17 1140 Estimated creatinine clearance: 35.5 mL/min (A)  CBG: No results for input(s): GLUCAP in the last 168 hours.  Radiological Exams on Admission: Dg Chest Port 1 View  Result Date: 10/15/2017 CLINICAL DATA:  Weakness and atrial fibrillation EXAM: PORTABLE CHEST 1 VIEW COMPARISON:  07/08/2007 FINDINGS: Cardiac shadow is within normal limits. The lungs are well aerated bilaterally. Mild mid lung interstitial changes are noted similar to that seen on prior CT examination. No focal infiltrate or sizable effusion is noted. No bony abnormality is noted. IMPRESSION: No acute abnormality seen. Electronically Signed   By: Inez Catalina M.D.   On: 10/15/2017 10:50    EKG: Independently reviewed. Afib with RVR, no stemi.  Assessment/Plan Principal Problem:   Atrial fibrillation with rapid ventricular response (HCC) Active Problems:   Hyponatremia   N&V (nausea and vomiting)   Diabetes mellitus without complication (HCC)  Afib w/ RVR w/ syncope Dilt infusion if needed Initial trop neg, will recheck Will address dehydration  Hyponatremia No neuro sx, likely due to dehydration from GI bug and low PO intake Serial Na Neuro  checks Checking Mag and Phos  N/V Occur when pt lays on R side Check abd XR Prn zofran  Low Ca Replace and recheck   DM SSI AC Hold metformin  Hypertension When necessary hydralazine 10 mg IV as needed for severe blood pressure Hold norvasc, prinzide  Anemia Fecal occult neg Anemia labs sent Will recheck All type and crossed Denies sx of bleeding Hold asa  Allergies [place] nasal spray for now Start nasonex in AM  Depression Cont zoloft No SI/HI   Code Status: FC  DVT Prophylaxis: SCDs Family Communication: dgtr at bedside Disposition Plan: Pending Improvement  Status: inpt tele  Elwin Mocha, MD Family Medicine [facility]  [email or web address] Password [email or web address]

## 2017-10-15 NOTE — ED Notes (Signed)
Notified pharmacy for missing medications

## 2017-10-15 NOTE — ED Provider Notes (Signed)
[facility] EMERGENCY DEPARTMENT Provider Note   CSN: [id number] Arrival date & time: 10/15/17  0941     History   Chief Complaint Chief Complaint  Patient presents with  . Atrial Fibrillation  . Emesis    HPI Anna Cuevas is a 77 y.o. female.  HPI Patient reports she has been sick for about 2 weeks.  She has been getting increasingly weak.  The first week she reports she had some vomiting.  This last week she reports she has not been able to eat much.  She reports her stool has been dark in appearance.  No fever that she is aware of.  Patient denies palpitations, chest pain or dyspnea.  She reports she has gotten so weak so that she cannot get up to move.  Patient reports medical history of type 2 diabetes, hypertension and hypercholesterolemia.  She reports she is compliant with her medications and continue to take them this week.  Patient is not aware of any prior diagnosis of atrial fibrillation.  EMS administered 15 mg of metoprolol prior to arrival for atrial fibrillation rates of 120s-160s.  Documented blood pressure by EMS was 110s/ 70s. Past Medical History:  Diagnosis Date  . Diabetes mellitus without complication ([place])   . Hypertension     Patient Active Problem List   Diagnosis Date Noted  . Atrial fibrillation with rapid ventricular response ([place]) 10/15/2017    Past Surgical History:  Procedure Laterality Date  . CATARACT EXTRACTION, BILATERAL Bilateral      OB History   None      Home Medications    Prior to Admission medications   Medication Sig Start Date End Date Taking? Authorizing Provider  amLODipine (NORVASC) 10 MG tablet Take 10 mg by mouth daily. 08/09/17  Yes [provider]  aspirin EC 81 MG tablet Take 81 mg by mouth daily.   Yes [provider]  BISACODYL LAXATIVE PO Take 1 tablet by mouth as needed (constipation).   Yes [provider]  docusate sodium (COLACE) 100 MG capsule Take 200 mg by  mouth as needed for mild constipation.   Yes [provider]  lisinopril-hydrochlorothiazide (PRINZIDE,ZESTORETIC) 20-12.5 MG tablet Take 2 tablets by mouth daily. 10/07/17  Yes [provider]  Menthol-Methyl Salicylate (ICY HOT) 73-71 % STCK Apply 1 application topically as needed (Back and Neck pain).   Yes [provider]  metFORMIN (GLUCOPHAGE) 500 MG tablet Take 500 mg by mouth daily. 10/14/17  Yes [provider]  sertraline (ZOLOFT) 100 MG tablet Take 200 mg by mouth daily. 08/10/17  Yes [provider]    Family History Family History  Problem Relation Age of Onset  . Stroke Neg Hx     Social History Social History   Tobacco Use  . Smoking status: Never Smoker  . Smokeless tobacco: Never Used  Substance Use Topics  . Alcohol use: Never    Frequency: Never  . Drug use: Never     Allergies   Patient has no allergy information on record.   Review of Systems Review of Systems 10 Systems reviewed and are negative for acute change except as noted in the HPI.  Physical Exam Updated Vital Signs BP (!) 95/55   Pulse 98   Temp (!) 97.2 F (36.2 C) (Oral)   Resp 17   Ht 5\' 3"  (1.6 m)   Wt 88.5 kg (195 lb)   SpO2 98%   BMI 34.54 kg/m   Physical  Exam  Constitutional: She is oriented to person, place, and time.  Patient is alert with normal mental status.  No respiratory distress at rest.  She does appear pale and fatigued.  HENT:  Left Ear: External ear normal.  Nose: Nose normal.  Mouth/Throat: Oropharynx is clear and moist.  Because membranes pale in appearance.  Eyes: Pupils are equal, round, and reactive to light. EOM are normal.  Neck: Neck supple.  Cardiovascular:  Tachycardia, irregularly irregular.  Pulmonary/Chest:  No respiratory distress at rest.  Lungs are clear.  No gross wheeze rhonchi or rale.  Abdominal: Soft. She exhibits no distension. There is no tenderness. There is no guarding.  Patient has a old  tissue between her skin fold in the groin on the left.  There is a somewhat malodorous.  There is however minimal breakdown.  Patient reports she had put that there because she had a lot of sweating and it was uncomfortable.  She reports she got too weak to continue maintaining the area.  Genitourinary:  Genitourinary Comments: Rectal digital examination trace stool in the vault.  Dark brownish green.  Musculoskeletal:  No peripheral edema.  Skin condition of the lower extremities is very good.  Patient does have minor amount of skin breakdown on the sacral area and the buttock.  The superficial abrasion appearing area approximately 1 cm.  Neurological: She is alert and oriented to person, place, and time. No cranial nerve deficit. She exhibits normal muscle tone. Coordination normal.  No focal neurologic dysfunction.  The patient is generally weak and finds it difficult to roll herself in the stretcher.  Skin: Skin is warm and dry. There is pallor.  Psychiatric: She has a normal mood and affect.     ED Treatments / Results  Labs (all labs ordered are listed, but only abnormal results are displayed) Labs Reviewed  COMPREHENSIVE METABOLIC PANEL - Abnormal; Notable for the following components:      Result Value   Sodium 124 (*)    Chloride 84 (*)    Glucose, Bld 314 (*)    BUN 92 (*)    Creatinine, Ser 1.43 (*)    Calcium 8.3 (*)    Total Protein 6.1 (*)    Albumin 2.9 (*)    Alkaline Phosphatase 133 (*)    GFR calc non Af Amer 34 (*)    GFR calc Af Amer 40 (*)    All other components within normal limits  LIPASE, BLOOD - Abnormal; Notable for the following components:   Lipase 61 (*)    All other components within normal limits  BRAIN NATRIURETIC PEPTIDE - Abnormal; Notable for the following components:   B Natriuretic Peptide 155.3 (*)    All other components within normal limits  CBC WITH DIFFERENTIAL/PLATELET - Abnormal; Notable for the following components:   WBC 17.3 (*)     RBC 3.22 (*)    Hemoglobin 9.0 (*)    HCT 26.6 (*)    Platelets 488 (*)    Neutro Abs 15.4 (*)    All other components within normal limits  I-STAT CHEM 8, ED - Abnormal; Notable for the following components:   Sodium 124 (*)    Chloride 82 (*)    BUN 92 (*)    Creatinine, Ser 1.40 (*)    Glucose, Bld 306 (*)    Calcium, Ion 1.07 (*)    Hemoglobin 9.2 (*)    HCT 27.0 (*)    All other components within normal limits  URINE CULTURE  CULTURE, BLOOD (ROUTINE X 2)  CULTURE, BLOOD (ROUTINE X 2)  PROTIME-INR  URINALYSIS, ROUTINE W REFLEX MICROSCOPIC  MAGNESIUM  PHOSPHORUS  BASIC METABOLIC PANEL  BASIC METABOLIC PANEL  BASIC METABOLIC PANEL  TROPONIN I  I-STAT CG4 LACTIC ACID, ED  POC OCCULT BLOOD, ED  I-STAT TROPONIN, ED  TYPE AND SCREEN  ABO/RH    EKG EKG Interpretation  Date/Time:  Thursday October 15 2017 09:49:35 EDT Ventricular Rate:  114 PR Interval:    QRS Duration: 96 QT Interval:  345 QTC Calculation: 459 R Axis:   49 Text Interpretation:  Atrial fibrillation agree. no acute ischemic changes Confirmed by Charlesetta Shanks [phone]) on 10/15/2017 10:12:48 AM   Radiology Dg Chest Port 1 View  Result Date: 10/15/2017 CLINICAL DATA:  Weakness and atrial fibrillation EXAM: PORTABLE CHEST 1 VIEW COMPARISON:  07/08/2007 FINDINGS: Cardiac shadow is within normal limits. The lungs are well aerated bilaterally. Mild mid lung interstitial changes are noted similar to that seen on prior CT examination. No focal infiltrate or sizable effusion is noted. No bony abnormality is noted. IMPRESSION: No acute abnormality seen. Electronically Signed   By: Inez Catalina M.D.   On: 10/15/2017 10:50    Procedures Procedures (including critical care time) CRITICAL CARE Performed by: Si Gaul   Total critical care time: 30  minutes  Critical care time was exclusive of separately billable procedures and treating other patients.  Critical care was necessary to treat or prevent  imminent or life-threatening deterioration.  Critical care was time spent personally by me on the following activities: development of treatment plan with patient and/or surrogate as well as nursing, discussions with consultants, evaluation of patient's response to treatment, examination of patient, obtaining history from patient or surrogate, ordering and performing treatments and interventions, ordering and review of laboratory studies, ordering and review of radiographic studies, pulse oximetry and re-evaluation of patient's condition. Medications Ordered in ED Medications  0.9 %  sodium chloride infusion (has no administration in time range)  calcium gluconate 1 g in sodium chloride 0.9 % 100 mL IVPB (has no administration in time range)  sodium chloride 0.9 % bolus 500 mL (0 mLs Intravenous Stopped 10/15/17 1230)     Initial Impression / Assessment and Plan / ED Course  I have reviewed the triage vital signs and the nursing notes.  Pertinent labs & imaging results that were available during my care of the patient were reviewed by me and considered in my medical decision making (see chart for details).    : Dr. Aggie Moats for admission.  Patient remains alert and appropriate.  She has improved appearance since rehydration.  No respiratory distress.  Mental status clear.  Heart rate 115.  Blood pressure 114/73.  Final Clinical Impressions(s) / ED Diagnoses   Final diagnoses:  Atrial fibrillation with RVR (HCC)  Nausea vomiting and diarrhea  Hyponatremia   Patient presents as outlined above with approximately a week of vomiting and subsequent week of poor oral intake.  He denies abdominal pain or chest pain.  Patient has become dehydrated and I suspect the combination of dehydration and electrolyte imbalance has triggered atrial fibrillation.  Patient denies prior history of atrial fibrillation. ED Discharge Orders    None       Charlesetta Shanks, MD 10/24/17 1515

## 2017-10-15 NOTE — ED Notes (Signed)
Pt stable, alert and oriented x4. Skin warm and dry. Respirations equal and unlabored. Pt ate 30% of meal. Family sitting with patient at bedside. VSS. HR 110, BP WDL, pt tolerating Cardizem drip (see MAR) well.

## 2017-10-16 ENCOUNTER — Encounter (HOSPITAL_COMMUNITY): Payer: Self-pay

## 2017-10-16 ENCOUNTER — Other Ambulatory Visit (HOSPITAL_COMMUNITY): Payer: PPO

## 2017-10-16 LAB — CBC
HEMATOCRIT: 24.4 % — AB (ref 36.0–46.0)
HEMOGLOBIN: 8.2 g/dL — AB (ref 12.0–15.0)
MCH: 27.9 pg (ref 26.0–34.0)
MCHC: 33.6 g/dL (ref 30.0–36.0)
MCV: 83 fL (ref 78.0–100.0)
Platelets: 382 10*3/uL (ref 150–400)
RBC: 2.94 MIL/uL — ABNORMAL LOW (ref 3.87–5.11)
RDW: 14.1 % (ref 11.5–15.5)
WBC: 10.4 10*3/uL (ref 4.0–10.5)

## 2017-10-16 LAB — MRSA PCR SCREENING: MRSA by PCR: NEGATIVE

## 2017-10-16 LAB — BASIC METABOLIC PANEL
ANION GAP: 12 (ref 5–15)
Anion gap: 13 (ref 5–15)
BUN: 72 mg/dL — ABNORMAL HIGH (ref 6–20)
BUN: 66 mg/dL — ABNORMAL HIGH (ref 6–20)
CALCIUM: 8.1 mg/dL — AB (ref 8.9–10.3)
CHLORIDE: 94 mmol/L — AB (ref 101–111)
CO2: 23 mmol/L (ref 22–32)
CO2: 24 mmol/L (ref 22–32)
CREATININE: 0.99 mg/dL (ref 0.44–1.00)
CREATININE: 0.95 mg/dL (ref 0.44–1.00)
Calcium: 8.4 mg/dL — ABNORMAL LOW (ref 8.9–10.3)
Chloride: 92 mmol/L — ABNORMAL LOW (ref 101–111)
GFR calc Af Amer: >60 mL/min (ref >60)
GFR calc non Af Amer: 56 mL/min — ABNORMAL LOW (ref >60)
GFR, EST NON AFRICAN AMERICAN: 54 mL/min — AB (ref >60)
Glucose, Bld: 167 mg/dL — ABNORMAL HIGH (ref 65–99)
Glucose, Bld: 188 mg/dL — ABNORMAL HIGH (ref 65–99)
POTASSIUM: 3.4 mmol/L — AB (ref 3.5–5.1)
POTASSIUM: 3.3 mmol/L — AB (ref 3.5–5.1)
Sodium: 128 mmol/L — ABNORMAL LOW (ref 135–145)
Sodium: 130 mmol/L — ABNORMAL LOW (ref 135–145)

## 2017-10-16 LAB — GLUCOSE, CAPILLARY
GLUCOSE-CAPILLARY: 196 mg/dL — AB (ref 65–99)
Glucose-Capillary: 252 mg/dL — ABNORMAL HIGH (ref 65–99)
Glucose-Capillary: 234 mg/dL — ABNORMAL HIGH (ref 65–99)

## 2017-10-16 LAB — MAGNESIUM: Magnesium: 1.7 mg/dL (ref 1.7–2.4)

## 2017-10-16 LAB — CBG MONITORING, ED: GLUCOSE-CAPILLARY: 187 mg/dL — AB (ref 65–99)

## 2017-10-16 LAB — PHOSPHORUS: PHOSPHORUS: 2.7 mg/dL (ref 2.5–4.6)

## 2017-10-16 LAB — URINE CULTURE: CULTURE: NO GROWTH

## 2017-10-16 MED ORDER — DILTIAZEM HCL 60 MG PO TABS
60.0000 mg | ORAL_TABLET | Freq: Three times a day (TID) | ORAL | Status: DC
  Administered 2017-10-16: 60 mg via ORAL
  Filled 2017-10-16: qty 1

## 2017-10-16 NOTE — Consult Note (Signed)
[facility] Nurse wound consult note Reason for Consult: sacrum and buttocks Patient with diarrhea and vomiting, less mobile at home, sleeping on couch. Using incontinence brief at home.   Wound type: Stage 2 left buttock; partial thickness;MASD (moisture associated skin damage) central sacrum/with Stage 1 pressure injury; non blanchable Pressure Injury POA: Yes Measurement: Left buttock: 1.0cm x 1.5cm x 0.1cm Sacrum: 3.0cm x 4.0cm x0cm Wound bed: pink, moist, non blanchable redness Drainage (amount, consistency, odor) none Periwound: intact  Dressing procedure/placement/frequency: Silicone foam to the affected area. Change every 3 days and PRN soilage.   Discussed POC with patient and bedside nurse.  Re consult if needed, will not follow at this time. Thanks  Willia Lampert [employer], RN,CWOCN, CNS, [place] [phone])

## 2017-10-16 NOTE — Care Management Note (Signed)
Case Management Note  Patient Details  Name: Anna Cuevas MRN: [id number] Date of Birth: 06/23/1941  Subjective/Objective:    Pt admitted with SOB                Action/Plan:  PTA from home.  SNF recommended however pt and family refusing.  Pts family state they will provide 24/7 supervision at discharge.  CM left HH list at bedside.  CM requested HH/face to face order from attending.   Expected Discharge Date:                  Expected Discharge Plan:  [place]  In-House Referral:     Discharge planning Services  CM Consult  Post Acute Care Choice:    Choice offered to:     DME Arranged:    DME Agency:     HH Arranged:    HH Agency:     Status of Service:     If discussed at [employer], dates discussed:    Additional Comments:  Maryclare Labrador, RN 10/16/2017, 3:45 PM

## 2017-10-16 NOTE — Evaluation (Addendum)
Physical Therapy Evaluation Patient Details Name: Anna Cuevas MRN: [id number] DOB: 1941/05/15 Today's Date: 10/16/2017   History of Present Illness  Pt is a 77 y/o female admitted secondary to nausea/vomiting and progressive wewakness. Upon arrival found to have a fib with RVR. CT of head, chest imaging, and abdominal imaging negative for acute abnormality. PMH includes DM and HTN.   Clinical Impression  Pt admitted secondary to problem above with deficits below. Pt very limited by fatigue this session. Pt presenting with weakness in BLE and elevated HR. HR ranged from upper 110s to mid 140s with brief second up to mid 150s during mobility. Mobility limited to standing and required mod A +2. Feel pt would benefit from short term SNF prior to d/c home, however, pt and family very adamant about going home with HHPT services. Educated about importance of having 24/7 assist at home, and daughter agreeable. Will continue to follow acutely to maximize functional mobility independence and safety.     Follow Up Recommendations Home health PT;Supervision/Assistance - 24 hour(pt and family adamantly refusing SNF)    Equipment Recommendations  None recommended by PT    Recommendations for Other Services OT consult     Precautions / Restrictions Precautions Precautions: Fall Restrictions Weight Bearing Restrictions: No      Mobility  Bed Mobility Overal bed mobility: Needs Assistance Bed Mobility: Supine to Sit;Sit to Supine     Supine to sit: Mod assist Sit to supine: Max assist;+2 for physical assistance   General bed mobility comments: Mod A for LE management and trunk elevation to come to sitting. Also required assist with scooting hips to EOB. Required max A +2 for return to supine for assist with trunk descent and LE lift assist.   Transfers Overall transfer level: Needs assistance Equipment used: 2 person hand held assist Transfers: Sit to/from Stand Sit to Stand: Mod  assist;+2 physical assistance         General transfer comment: Mod A +2 to stand. Pt's daughter asissting with transfer. Pt very weak and unable to tolerate longer periods of standing.   Ambulation/Gait             General Gait Details: Unable   Stairs            Wheelchair Mobility    Modified Rankin (Stroke Patients Only)       Balance Overall balance assessment: Needs assistance Sitting-balance support: Bilateral upper extremity supported;Feet supported Sitting balance-Leahy Scale: Fair     Standing balance support: Bilateral upper extremity supported Standing balance-Leahy Scale: Poor Standing balance comment: Reliant on BUE support and external assist to stand.                              Pertinent Vitals/Pain Pain Assessment: Faces Faces Pain Scale: Hurts even more Pain Location: L hip  Pain Descriptors / Indicators: Aching Pain Intervention(s): Limited activity within patient's tolerance;Repositioned;Monitored during session    Home Living Family/patient expects to be discharged to:: Private residence Living Arrangements: Alone Available Help at Discharge: Family;Available 24 hours/day Type of Home: House Home Access: Stairs to enter Entrance Stairs-Rails: None Entrance Stairs-Number of Steps: 1(threshold step ) Home Layout: One level Home Equipment: Walker - 2 wheels;Bedside commode;Shower seat Additional Comments: Pt's daughter reports they check on her multiple times a day.     Prior Function Level of Independence: Needs assistance   Gait / Transfers Assistance Needed: Reports for the past two  weeks has been having to use a RW secondary to weakness. Initially requires assist to stand in the mornings. Prior was independent.   ADL's / Homemaking Assistance Needed: Reports needing assist with bathing and dressing the past couple of weeks. Prior to sickness, was independent.         Hand Dominance        Extremity/Trunk  Assessment   Upper Extremity Assessment Upper Extremity Assessment: Defer to OT evaluation    Lower Extremity Assessment Lower Extremity Assessment: Generalized weakness;LLE deficits/detail LLE Deficits / Details: L hip bursitis at baseline.     Cervical / Trunk Assessment Cervical / Trunk Assessment: Kyphotic  Communication   Communication: No difficulties  Cognition Arousal/Alertness: Awake/alert Behavior During Therapy: WFL for tasks assessed/performed Overall Cognitive Status: Within Functional Limits for tasks assessed                                        General Comments General comments (skin integrity, edema, etc.): HR ranged from upper 110s to low 140s with brief second of HR up to mid 150s. Discussed recommendations for short term SNF, however, pt's daughter and pt very adamant about pt d/c'ing home and did not want to consider short term SNF at this time. Discussed need for 24/7 assist at home if pt is to d/c home.     Exercises General Exercises - Lower Extremity Ankle Circles/Pumps: AROM;20 reps;Both Quad Sets: AROM;Both;10 reps Heel Slides: AROM;Both;10 reps   Assessment/Plan    PT Assessment Patient needs continued PT services  PT Problem List Decreased strength;Decreased activity tolerance;Decreased balance;Decreased mobility;Decreased knowledge of use of DME;Decreased knowledge of precautions;Decreased safety awareness;Cardiopulmonary status limiting activity       PT Treatment Interventions DME instruction;Therapeutic activities;Gait training;Therapeutic exercise;Stair training;Balance training;Neuromuscular re-education;Functional mobility training;Patient/family education    PT Goals (Current goals can be found in the Care Plan section)  Acute Rehab PT Goals Patient Stated Goal: to go home  PT Goal Formulation: With patient/family Time For Goal Achievement: 10/30/17 Potential to Achieve Goals: Fair    Frequency Min 3X/week    Barriers to discharge        Co-evaluation               AM-PAC PT "6 Clicks" Daily Activity  Outcome Measure Difficulty turning over in bed (including adjusting bedclothes, sheets and blankets)?: Unable Difficulty moving from lying on back to sitting on the side of the bed? : Unable Difficulty sitting down on and standing up from a chair with arms (e.g., wheelchair, bedside commode, etc,.)?: Unable Help needed moving to and from a bed to chair (including a wheelchair)?: Total Help needed walking in hospital room?: Total Help needed climbing 3-5 steps with a railing? : Total 6 Click Score: 6    End of Session Equipment Utilized During Treatment: Gait belt Activity Tolerance: Patient limited by fatigue Patient left: in bed;with call bell/phone within reach;with family/visitor present   PT Visit Diagnosis: Unsteadiness on feet (R26.81);Muscle weakness (generalized) (M62.81)    Time: 1194-1740 PT Time Calculation (min) (ACUTE ONLY): 37 min   Charges:   PT Evaluation $PT Eval Moderate Complexity: 1 Mod PT Treatments $Therapeutic Activity: 8-22 mins   PT G Codes:        Leighton Ruff, PT, DPT  Acute Rehabilitation Services  Pager: [phone]   Rudean Hitt 10/16/2017, 12:09 PM

## 2017-10-16 NOTE — Progress Notes (Signed)
12:00 sugar not checked/insulin coverage not given before arrival to unit so checked sugar on arrival. BG covered at 1606 as pt requested a snack and BG noted in 250s. BG now 234 and dinner has just arrived on floor. Contacted MD about BG remaining at 234 & whether to provide additional coverage. Will continue to monitor.   Anna  Daymeon Fischman, RN

## 2017-10-16 NOTE — Progress Notes (Signed)
PROGRESS NOTE    Anna Cuevas  YCX:[id number] DOB: Jan 28, 1941 DOA: 10/15/2017 PCP: [name], MD  Brief Narrative: 77 year old female admitted with generalized weakness and found to be in A. fib with RVR.  Has been on diltiazem drip since yesterday.  Assessment & Plan:   Principal Problem:   Atrial fibrillation with rapid ventricular response (HCC)-orally load with Cardizem right now.  Wean drip as heart rate tolerates to keep below 100.  Active Problems:   Hyponatremia-improving up to 130.  Continue normal saline IV fluids   N&V (nausea and vomiting)-resolved   Diabetes mellitus without complication (HCC)-continue sliding scale insulin Metformin on hold  Hypertension-Norvasc and Prinzide on hold   DVT prophylaxis: SCDs  code Status: Full  family Communication: Daughter  disposition Plan: When heart rate is better controlled on oral medications   Subjective: Denies shortness of breath chest pain.  Nausea vomiting resolved.  Feels fine.  Still on diltiazem drip with heart rate bouncing from 90-120.  Objective: Vitals:   10/16/17 0630 10/16/17 0645 10/16/17 0927 10/16/17 0952  BP: 113/60  (!) 124/58 137/75  Pulse: 63 83 82 (!) 59  Resp: (!) 24 (!) 25 (!) 25 (!) 28  Temp:      TempSrc:      SpO2: 97% 95% 98% 97%  Weight:      Height:        Intake/Output Summary (Last 24 hours) at 10/16/2017 1036 Last data filed at 10/15/2017 1821 Gross per 24 hour  Intake 1675 ml  Output -  Net 1675 ml   Filed Weights   10/15/17 1003  Weight: 88.5 kg (195 lb)    Examination:  General exam: Appears calm and comfortable  Respiratory system: Clear to auscultation. Respiratory effort normal. Cardiovascular system: S1 & S2 heard, iRRiR. No JVD, murmurs, rubs, gallops or clicks. No pedal edema. Gastrointestinal system: Abdomen is nondistended, soft and nontender. No organomegaly or masses felt. Normal bowel sounds heard. Central nervous system: Alert and oriented. No focal  neurological deficits. Extremities: Symmetric 5 x 5 power. Skin: No rashes, lesions or ulcers Psychiatry: Judgement and insight appear normal. Mood & affect appropriate.     Data Reviewed: I have personally reviewed following labs and imaging studies  CBC: Recent Labs  Lab 10/15/17 1110 10/15/17 1140 10/15/17 2137 10/16/17 0701  WBC 17.3*  --   --  10.4  NEUTROABS 15.4*  --   --   --   HGB 9.0* 9.2* 7.9* 8.2*  HCT 26.6* 27.0* 23.7* 24.4*  MCV 82.6  --   --  83.0  PLT 488*  --   --  497   Basic Metabolic Panel: Recent Labs  Lab 10/15/17 1110 10/15/17 1140 10/15/17 1814 10/15/17 2250 10/16/17 0701  NA 124* 124* 128* 128* 130*  K 3.6 3.6 3.4* 3.4* 3.3*  CL 84* 82* 90* 92* 94*  CO2 27  --  25 23 24   GLUCOSE 314* 306* 209* 167* 188*  BUN 92* 92* 78* 72* 66*  CREATININE 1.43* 1.40* 1.12* 0.99 0.95  CALCIUM 8.3*  --  8.5* 8.1* 8.4*  MG 1.7  --   --   --  1.7  PHOS 2.9  --   --   --  2.7   GFR: Estimated Creatinine Clearance: 52.3 mL/min (by C-G formula based on SCr of 0.95 mg/dL). Liver Function Tests: Recent Labs  Lab 10/15/17 1110  AST 32  ALT 38  ALKPHOS 133*  BILITOT 0.5  PROT 6.1*  ALBUMIN 2.9*   Recent Labs  Lab 10/15/17 1110  LIPASE 61*   No results for input(s): AMMONIA in the last 168 hours. Coagulation Profile: Recent Labs  Lab 10/15/17 1110  INR 1.04   Cardiac Enzymes: Recent Labs  Lab 10/15/17 1814  TROPONINI <0.03   BNP (last 3 results) No results for input(s): PROBNP in the last 8760 hours. HbA1C: No results for input(s): HGBA1C in the last 72 hours. CBG: Recent Labs  Lab 10/15/17 1833 10/15/17 2340 10/16/17 0851  GLUCAP 200* 152* 187*   Lipid Profile: No results for input(s): CHOL, HDL, LDLCALC, TRIG, CHOLHDL, LDLDIRECT in the last 72 hours. Thyroid Function Tests: No results for input(s): TSH, T4TOTAL, FREET4, T3FREE, THYROIDAB in the last 72 hours. Anemia Panel: Recent Labs    10/15/17 1814  VITAMINB12 997*    FOLATE 29.0  FERRITIN 216  TIBC 263  IRON 35  RETICCTPCT 1.7   Sepsis Labs: Recent Labs  Lab 10/15/17 1140  LATICACIDVEN 1.18    No results found for this or any previous visit (from the past 240 hour(s)).       Radiology Studies: Ct Head Wo Contrast  Result Date: 10/15/2017 CLINICAL DATA:  Altered mental status EXAM: CT HEAD WITHOUT CONTRAST TECHNIQUE: Contiguous axial images were obtained from the base of the skull through the vertex without intravenous contrast. COMPARISON:  None. FINDINGS: Brain: No mass lesion, intraparenchymal hemorrhage or extra-axial collection. No evidence of acute cortical infarct. Normal appearance of the brain parenchyma and extra axial spaces for age. Vascular: No hyperdense vessel or unexpected vascular calcification. Skull: Normal visualized skull base, calvarium and extracranial soft tissues. Sinuses/Orbits: No sinus fluid levels or advanced mucosal thickening. No mastoid effusion. Normal orbits. IMPRESSION: Normal brain. Electronically Signed   By: Ulyses Jarred M.D.   On: 10/15/2017 16:55   Dg Chest Port 1 View  Result Date: 10/15/2017 CLINICAL DATA:  Weakness and atrial fibrillation EXAM: PORTABLE CHEST 1 VIEW COMPARISON:  07/08/2007 FINDINGS: Cardiac shadow is within normal limits. The lungs are well aerated bilaterally. Mild mid lung interstitial changes are noted similar to that seen on prior CT examination. No focal infiltrate or sizable effusion is noted. No bony abnormality is noted. IMPRESSION: No acute abnormality seen. Electronically Signed   By: Inez Catalina M.D.   On: 10/15/2017 10:50   Dg Abd Portable 1v  Result Date: 10/15/2017 CLINICAL DATA:  Nausea vomiting.  No abdominal pain. EXAM: PORTABLE ABDOMEN - 1 VIEW COMPARISON:  None. FINDINGS: The bowel gas pattern is normal. 3 mm calcification projecting over the right renal shadow may represent a small calculus. 4.7 cm calcified uterine fibroid in the pelvis. No acute osseous abnormality.  Degenerative changes of the lumbar spine. IMPRESSION: 1. No evidence of bowel obstruction. 2. Possible 3 mm right renal calculus. Electronically Signed   By: Titus Dubin M.D.   On: 10/15/2017 14:25        Scheduled Meds: . insulin aspart  0-15 Units Subcutaneous TID WC  . sertraline  200 mg Oral Daily  . sodium chloride  2 spray Each Nare TID   Continuous Infusions: . diltiazem (CARDIZEM) infusion 5 mg/hr (10/16/17 0948)     LOS: 1 day       Collene Massimino A, MD [facility] Pager [phone]  If 7PM-7AM, please contact night-coverage [email or web address] Password [facility] 10/16/2017, 10:36 AM

## 2017-10-17 ENCOUNTER — Inpatient Hospital Stay (HOSPITAL_COMMUNITY): Payer: PPO

## 2017-10-17 DIAGNOSIS — I361 Nonrheumatic tricuspid (valve) insufficiency: Secondary | ICD-10-CM

## 2017-10-17 DIAGNOSIS — L8990 Pressure ulcer of unspecified site, unspecified stage: Secondary | ICD-10-CM

## 2017-10-17 DIAGNOSIS — I4891 Unspecified atrial fibrillation: Principal | ICD-10-CM

## 2017-10-17 LAB — ECHOCARDIOGRAM COMPLETE
Height: 63 in
Weight: 3061.75 oz

## 2017-10-17 LAB — GLUCOSE, CAPILLARY
GLUCOSE-CAPILLARY: 164 mg/dL — AB (ref 65–99)
GLUCOSE-CAPILLARY: 180 mg/dL — AB (ref 65–99)
Glucose-Capillary: 203 mg/dL — ABNORMAL HIGH (ref 65–99)
Glucose-Capillary: 209 mg/dL — ABNORMAL HIGH (ref 65–99)

## 2017-10-17 LAB — CALCIUM, IONIZED: Calcium, Ionized, Serum: 4.9 mg/dL (ref 4.5–5.6)

## 2017-10-17 LAB — TSH: TSH: 0.778 u[IU]/mL (ref 0.350–4.500)

## 2017-10-17 MED ORDER — DILTIAZEM HCL 60 MG PO TABS: 60.0000 mg | ORAL_TABLET | Freq: Four times a day (QID) | ORAL | Status: DC

## 2017-10-17 MED ORDER — PANTOPRAZOLE SODIUM 40 MG PO TBEC
40.0000 mg | DELAYED_RELEASE_TABLET | Freq: Every day | ORAL | Status: DC
  Administered 2017-10-17 – 2017-10-20 (×4): 40 mg via ORAL
  Filled 2017-10-17 (×4): qty 1

## 2017-10-17 MED ORDER — DILTIAZEM HCL 60 MG PO TABS
60.0000 mg | ORAL_TABLET | Freq: Four times a day (QID) | ORAL | Status: DC
  Administered 2017-10-17 – 2017-10-19 (×8): 60 mg via ORAL
  Filled 2017-10-17 (×8): qty 1

## 2017-10-17 MED ORDER — ASPIRIN 81 MG PO CHEW
81.0000 mg | CHEWABLE_TABLET | Freq: Every day | ORAL | Status: DC
  Administered 2017-10-17 – 2017-10-18 (×2): 81 mg via ORAL
  Filled 2017-10-17 (×2): qty 1

## 2017-10-17 MED ORDER — TAMSULOSIN HCL 0.4 MG PO CAPS
0.4000 mg | ORAL_CAPSULE | Freq: Every day | ORAL | Status: DC
  Administered 2017-10-17 – 2017-10-20 (×4): 0.4 mg via ORAL
  Filled 2017-10-17 (×4): qty 1

## 2017-10-17 NOTE — Progress Notes (Signed)
Md notified. Pt on Cardizem drip and getting po as well.  Will try to wean off Cardizem drip. Will continue to monitor. Anna Cuevas

## 2017-10-17 NOTE — Progress Notes (Signed)
  Echocardiogram 2D Echocardiogram has been performed.  Anna Cuevas F 10/17/2017, 11:15 AM

## 2017-10-17 NOTE — Progress Notes (Signed)
Physical Therapy Treatment Patient Details Name: Anna Cuevas MRN: [id number] DOB: 11-20-1940 Today's Date: 10/17/2017    History of Present Illness Pt is a 77 y/o female admitted secondary to nausea/vomiting and progressive wewakness. Upon arrival found to have a fib with RVR. CT of head, chest imaging, and abdominal imaging negative for acute abnormality. PMH includes DM and HTN.     PT Comments    Pt very fatigued with all activity but was able to SPT to chair with +2 mod A and small pivot steps. HR fluctuated from 100 bpm to 138 bpm. Daughter present and supportive through session. PT will continue to follow.    Follow Up Recommendations  Home health PT;Supervision/Assistance - 24 hour     Equipment Recommendations  Wheelchair (measurements PT)    Recommendations for Other Services OT consult     Precautions / Restrictions Precautions Precautions: Fall Restrictions Weight Bearing Restrictions: No    Mobility  Bed Mobility Overal bed mobility: Needs Assistance Bed Mobility: Supine to Sit     Supine to sit: Mod assist     General bed mobility comments: pt able to bridge knees and initiate rolling, min A to achieve full SL position. Mod A for LE's off bed and elevation of trunk to sitting. Pt's LUE weaker than R and had difficulty pushing up on bed rail  Transfers Overall transfer level: Needs assistance Equipment used: Rolling walker (2 wheeled);2 person hand held assist Transfers: Stand Pivot Transfers;Sit to/from Stand Sit to Stand: Mod assist;+2 physical assistance Stand pivot transfers: Mod assist;+2 physical assistance       General transfer comment: pt stood to RW and maintained standing 30 secs before needing to sit back down. Both feet blocked for stand and cues for fwd wt shift and trunk extension. Mod A +2 for SPT with HHA. Pt able to take small pivot steps with frequent inctructional cues.   Ambulation/Gait             General Gait Details:  unable   Stairs            Wheelchair Mobility    Modified Rankin (Stroke Patients Only)       Balance Overall balance assessment: Needs assistance Sitting-balance support: Bilateral upper extremity supported;Feet supported Sitting balance-Leahy Scale: Fair     Standing balance support: Bilateral upper extremity supported Standing balance-Leahy Scale: Poor Standing balance comment: Reliant on BUE support and external assist to stand.                             Cognition Arousal/Alertness: Awake/alert Behavior During Therapy: WFL for tasks assessed/performed Overall Cognitive Status: Within Functional Limits for tasks assessed                                 General Comments: slightly slow to respond, better once up in chair      Exercises General Exercises - Lower Extremity Ankle Circles/Pumps: AROM;Both;10 reps;Seated Long Arc Quad: AROM;Both;10 reps;Seated    General Comments General comments (skin integrity, edema, etc.): pt limited by extreme fatigue with mobility      Pertinent Vitals/Pain Pain Assessment: Faces Faces Pain Scale: Hurts little more Pain Location: L ribs Pain Descriptors / Indicators: Sore Pain Intervention(s): Limited activity within patient's tolerance;Monitored during session    Home Living  Prior Function            PT Goals (current goals can now be found in the care plan section) Acute Rehab PT Goals Patient Stated Goal: to go home  PT Goal Formulation: With patient/family Time For Goal Achievement: 10/30/17 Potential to Achieve Goals: Fair Progress towards PT goals: Progressing toward goals    Frequency    Min 3X/week      PT Plan Equipment recommendations need to be updated    Co-evaluation              AM-PAC PT "6 Clicks" Daily Activity  Outcome Measure  Difficulty turning over in bed (including adjusting bedclothes, sheets and blankets)?:  Unable Difficulty moving from lying on back to sitting on the side of the bed? : Unable Difficulty sitting down on and standing up from a chair with arms (e.g., wheelchair, bedside commode, etc,.)?: Unable Help needed moving to and from a bed to chair (including a wheelchair)?: A Lot Help needed walking in hospital room?: Total Help needed climbing 3-5 steps with a railing? : Total 6 Click Score: 7    End of Session Equipment Utilized During Treatment: Gait belt Activity Tolerance: Patient limited by fatigue Patient left: in chair;with call bell/phone within reach;with chair alarm set;with family/visitor present Nurse Communication: Mobility status PT Visit Diagnosis: Unsteadiness on feet (R26.81);Muscle weakness (generalized) (M62.81)     Time: 6295-2841 PT Time Calculation (min) (ACUTE ONLY): 37 min  Charges:  $Therapeutic Activity: 23-37 mins                    G Codes:       Leighton Roach, PT  Acute Rehab Services  [place] 10/17/2017, 10:07 AM

## 2017-10-17 NOTE — Progress Notes (Signed)
Patient Demographics:    Anna Cuevas, is a 77 y.o. female, DOB - February 23, 1941, MVH:[id number]  Admit date - 10/15/2017   Admitting Physician Elwin Mocha, MD  Outpatient Primary MD for the patient is [name], MD  LOS - 2   Chief Complaint  Patient presents with  . Atrial Fibrillation  . Emesis        Subjective:    [name] today has no fevers, no emesis,  No chest pain, patient is very weak, having difficulty with transfers, becomes tachycardic with transfers,  Assessment  & Plan :    Principal Problem:   Atrial fibrillation with rapid ventricular response (HCC) Active Problems:   Hyponatremia   N&V (nausea and vomiting)   Diabetes mellitus without complication (HCC)   Pressure injury of skin  Brief summary 77 year old female with past medical history relevant for diabetes, possible CKD stage III, hypertension and anemia admitted on 10/15/2017 with nausea vomiting/diarrhea and resulting dehydration and hyponatremia as well as A. fib with RVR started on IV Cardizem drip,    Assessment & Plan:      1)Atrial Fibrillation with Rapid Ventricular response (HCC)-rate control is better, will try to wean off IV Cardizem drip and transition to p.o. Cardizem, echo with EF over 65%  , magnesium and potassium are normal, TSH is pending. CHA2DS2-Vasc score-is = 5  Which is  equal to = 7.2% risk of stroke or 10% risk of stroke/TIA/systemic embolism, HASBLED score = 1 with risk of major bleeding of 1 in 100 patient years, patient and family members to decide on accepting anticoagulation.   2)Anemia- ??? Baseline, suspect chronic anemia of CKD exacerbated by hemodilution secondary to IV fluids, stool occult blood is negative, iron studies are low normal, B12 and folate are not low, hemoglobin is down to 8.2 from 9.2, no obvious bleeding  3)CKD III Vs AKI-baseline renal function not available,  creatinine is down to 0.9 after hydration from 1.43, hold lisinopril HCTZ due to kidney concerns at this time, avoid nephrotoxic agents  4)DM-hold metformin, allow some permissive Hyperglycemia rather than risk life-threatening hypoglycemia in a patient with unreliable oral intake. Use Novolog/Humalog Sliding scale insulin with Accu-Cheks/Fingersticks as ordered  5)HTN-prior to admission patient was on lisinopril HCT and amlodipine,  may use IV Hydralazine 10 mg  Every 4 hours Prn for systolic blood pressure over 160 mmhg   6)Disposition-patient is very weak, requiring assist of 2 to transfer,, son-in-law apparently is an occupational therapist, physical therapist suggested skilled nursing facility placement but patient and family are leaning to was discharged home with home health  7) buttock and sacral decubiti-wound care consult appreciated   DVT prophylaxis: SCDs , patient and family to decide on anticoagulation for A. fib code Status: Full  family Communication: Daughter  disposition Plan: When heart rate is better controlled on oral medications (Home with home health versus skilled nursing facility)   Lab Results  Component Value Date   PLT 382 10/16/2017    Inpatient Medications  Scheduled Meds: . diltiazem  60 mg Oral Q6H  . insulin aspart  0-15 Units Subcutaneous TID WC  . sertraline  200 mg Oral Daily  . sodium chloride  2 spray Each Nare TID   Continuous  Infusions: . diltiazem (CARDIZEM) infusion 15 mg/hr (10/17/17 1103)   PRN Meds:.acetaminophen **OR** acetaminophen, hydrALAZINE, ondansetron **OR** ondansetron (ZOFRAN) IV  Anti-infectives (From admission, onward)   None        Objective:   Vitals:   10/17/17 0346 10/17/17 0816 10/17/17 1107 10/17/17 1614  BP: 106/62 (!) 110/54 122/64 118/74  Pulse: 85 86 86 75  Resp: 20 19 (!) 26 19  Temp: 98.2 F (36.8 C) 98.5 F (36.9 C) 97.6 F (36.4 C) 98.7 F (37.1 C)  TempSrc: Oral Oral Oral Oral  SpO2: 98% 99%  98% 98%  Weight: 86.8 kg (191 lb 5.8 oz)     Height:        Wt Readings from Last 3 Encounters:  10/17/17 86.8 kg (191 lb 5.8 oz)     Intake/Output Summary (Last 24 hours) at 10/17/2017 1632 Last data filed at 10/17/2017 1200 Gross per 24 hour  Intake 700.84 ml  Output 850 ml  Net -149.16 ml     Physical Exam  Gen:- Awake Alert,  In no apparent distress  HEENT:- [place].AT, No sclera icterus Neck-Supple Neck,No JVD,.  Lungs-  CTAB , fair air movement CV- S1, S2 normal, irregularly irregular heart rate up to the 120s with activity Abd-  +ve B.Sounds, Abd Soft, No tenderness,    Extremity/Skin:- No  edema,   good pulses Psych-affect is appropriate, oriented x3 Neuro-no new focal deficits, no tremors   Data Review:   Micro Results Recent Results (from the past 240 hour(s))  Culture, blood (routine x 2)     Status: None (Preliminary result)   Collection Time: 10/15/17 11:10 AM  Result Value Ref Range Status   Specimen Description BLOOD RIGHT ANTECUBITAL  Final   Special Requests   Final    BOTTLES DRAWN AEROBIC AND ANAEROBIC Blood Culture adequate volume   Culture   Final    NO GROWTH 2 DAYS Performed at [facility], [place] [address], [place], [place] [address]    Report Status PENDING  Incomplete  Culture, blood (routine x 2)     Status: None (Preliminary result)   Collection Time: 10/15/17 11:25 AM  Result Value Ref Range Status   Specimen Description BLOOD RIGHT HAND  Final   Special Requests   Final    BOTTLES DRAWN AEROBIC AND ANAEROBIC Blood Culture adequate volume   Culture   Final    NO GROWTH 2 DAYS Performed at [facility], [place] [address], [place], [place] [address]    Report Status PENDING  Incomplete  Urine culture     Status: None   Collection Time: 10/15/17  2:51 PM  Result Value Ref Range Status   Specimen Description URINE, CATHETERIZED  Final   Special Requests   Final    NONE Performed at [facility], [place] [address],  [place], [place] [address]    Culture NO GROWTH  Final   Report Status 10/16/2017 FINAL  Final  MRSA PCR Screening     Status: None   Collection Time: 10/16/17  2:53 PM  Result Value Ref Range Status   MRSA by PCR NEGATIVE NEGATIVE Final    Comment:        The GeneXpert MRSA Assay (FDA approved for NASAL specimens only), is one component of a comprehensive MRSA colonization surveillance program. It is not intended to diagnose MRSA infection nor to guide or monitor treatment for MRSA infections. Performed at [facility], [place] [address]  [place], [place] [address]     Radiology Reports Ct Head Wo Contrast  Result Date: 10/15/2017 CLINICAL DATA:  Altered mental status EXAM: CT HEAD WITHOUT CONTRAST TECHNIQUE: Contiguous axial images were obtained from the base of the skull through the vertex without intravenous contrast. COMPARISON:  None. FINDINGS: Brain: No mass lesion, intraparenchymal hemorrhage or extra-axial collection. No evidence of acute cortical infarct. Normal appearance of the brain parenchyma and extra axial spaces for age. Vascular: No hyperdense vessel or unexpected vascular calcification. Skull: Normal visualized skull base, calvarium and extracranial soft tissues. Sinuses/Orbits: No sinus fluid levels or advanced mucosal thickening. No mastoid effusion. Normal orbits. IMPRESSION: Normal brain. Electronically Signed   By: Ulyses Jarred M.D.   On: 10/15/2017 16:55   Dg Chest Port 1 View  Result Date: 10/15/2017 CLINICAL DATA:  Weakness and atrial fibrillation EXAM: PORTABLE CHEST 1 VIEW COMPARISON:  07/08/2007 FINDINGS: Cardiac shadow is within normal limits. The lungs are well aerated bilaterally. Mild mid lung interstitial changes are noted similar to that seen on prior CT examination. No focal infiltrate or sizable effusion is noted. No bony abnormality is noted. IMPRESSION: No acute abnormality seen. Electronically Signed   By: Inez Catalina M.D.   On: 10/15/2017 10:50    Dg Abd Portable 1v  Result Date: 10/15/2017 CLINICAL DATA:  Nausea vomiting.  No abdominal pain. EXAM: PORTABLE ABDOMEN - 1 VIEW COMPARISON:  None. FINDINGS: The bowel gas pattern is normal. 3 mm calcification projecting over the right renal shadow may represent a small calculus. 4.7 cm calcified uterine fibroid in the pelvis. No acute osseous abnormality. Degenerative changes of the lumbar spine. IMPRESSION: 1. No evidence of bowel obstruction. 2. Possible 3 mm right renal calculus. Electronically Signed   By: Titus Dubin M.D.   On: 10/15/2017 14:25     CBC Recent Labs  Lab 10/15/17 1110 10/15/17 1140 10/15/17 2137 10/16/17 0701  WBC 17.3*  --   --  10.4  HGB 9.0* 9.2* 7.9* 8.2*  HCT 26.6* 27.0* 23.7* 24.4*  PLT 488*  --   --  382  MCV 82.6  --   --  83.0  MCH 28.0  --   --  27.9  MCHC 33.8  --   --  33.6  RDW 13.9  --   --  14.1  LYMPHSABS 1.1  --   --   --   MONOABS 0.8  --   --   --   EOSABS 0.1  --   --   --   BASOSABS 0.0  --   --   --     Chemistries  Recent Labs  Lab 10/15/17 1110 10/15/17 1140 10/15/17 1814 10/15/17 2250 10/16/17 0701  NA 124* 124* 128* 128* 130*  K 3.6 3.6 3.4* 3.4* 3.3*  CL 84* 82* 90* 92* 94*  CO2 27  --  25 23 24   GLUCOSE 314* 306* 209* 167* 188*  BUN 92* 92* 78* 72* 66*  CREATININE 1.43* 1.40* 1.12* 0.99 0.95  CALCIUM 8.3*  --  8.5* 8.1* 8.4*  MG 1.7  --   --   --  1.7  AST 32  --   --   --   --   ALT 38  --   --   --   --   ALKPHOS 133*  --   --   --   --   BILITOT 0.5  --   --   --   --    ------------------------------------------------------------------------------------------------------------------  No results for input(s): CHOL, HDL, LDLCALC, TRIG, CHOLHDL, LDLDIRECT in the last 72 hours.  No results found for: HGBA1C ------------------------------------------------------------------------------------------------------------------ No results for input(s): TSH, T4TOTAL, T3FREE, THYROIDAB in the last 72 hours.  Invalid  input(s): FREET3 ------------------------------------------------------------------------------------------------------------------ Recent Labs    10/15/17 1814  VITAMINB12 997*  FOLATE 29.0  FERRITIN 216  TIBC 263  IRON 35  RETICCTPCT 1.7    Coagulation profile Recent Labs  Lab 10/15/17 1110  INR 1.04    No results for input(s): DDIMER in the last 72 hours.  Cardiac Enzymes Recent Labs  Lab 10/15/17 1814  TROPONINI <0.03   ------------------------------------------------------------------------------------------------------------------    Component Value Date/Time   BNP 155.3 (H) 10/15/2017 1110     Markisha Meding M.D on 10/17/2017 at 4:32 PM  Between 7am to 7pm - Pager - [phone]  After 7pm go to [email or web address] - password [email or web address]  [facility] -  Office  [phone]   Voice Recognition Viviann Spare dictation system was used to create this note, attempts have been made to correct errors. Please contact the author with questions and/or clarifications.

## 2017-10-18 LAB — BASIC METABOLIC PANEL
Anion gap: 10 (ref 5–15)
BUN: 34 mg/dL — ABNORMAL HIGH (ref 6–20)
CO2: 26 mmol/L (ref 22–32)
CREATININE: 0.79 mg/dL (ref 0.44–1.00)
Calcium: 8.2 mg/dL — ABNORMAL LOW (ref 8.9–10.3)
Chloride: 92 mmol/L — ABNORMAL LOW (ref 101–111)
GFR calc Af Amer: >60 mL/min (ref >60)
GLUCOSE: 224 mg/dL — AB (ref 65–99)
Potassium: 3.8 mmol/L (ref 3.5–5.1)
SODIUM: 128 mmol/L — AB (ref 135–145)

## 2017-10-18 LAB — GLUCOSE, CAPILLARY
GLUCOSE-CAPILLARY: 298 mg/dL — AB (ref 65–99)
GLUCOSE-CAPILLARY: 169 mg/dL — AB (ref 65–99)
Glucose-Capillary: 211 mg/dL — ABNORMAL HIGH (ref 65–99)
Glucose-Capillary: 198 mg/dL — ABNORMAL HIGH (ref 65–99)

## 2017-10-18 LAB — CBC
HEMATOCRIT: 25.3 % — AB (ref 36.0–46.0)
Hemoglobin: 8.1 g/dL — ABNORMAL LOW (ref 12.0–15.0)
MCH: 26.8 pg (ref 26.0–34.0)
MCHC: 32 g/dL (ref 30.0–36.0)
MCV: 83.8 fL (ref 78.0–100.0)
PLATELETS: 352 10*3/uL (ref 150–400)
RBC: 3.02 MIL/uL — ABNORMAL LOW (ref 3.87–5.11)
RDW: 14 % (ref 11.5–15.5)
WBC: 11.1 10*3/uL — AB (ref 4.0–10.5)

## 2017-10-18 MED ORDER — APIXABAN 5 MG PO TABS
5.0000 mg | ORAL_TABLET | Freq: Two times a day (BID) | ORAL | Status: DC
  Administered 2017-10-18 – 2017-10-20 (×5): 5 mg via ORAL
  Filled 2017-10-18 (×5): qty 1

## 2017-10-18 MED ORDER — METOPROLOL TARTRATE 12.5 MG HALF TABLET
12.5000 mg | ORAL_TABLET | Freq: Three times a day (TID) | ORAL | Status: DC
  Administered 2017-10-18 – 2017-10-19 (×3): 12.5 mg via ORAL
  Filled 2017-10-18 (×3): qty 1

## 2017-10-18 NOTE — Progress Notes (Signed)
Notified Md. Pt had no labs ordered today. Last hgb 8.2 and potassium 3.3 on 3/29.  Will continue to monitor. Saunders Revel T

## 2017-10-18 NOTE — Progress Notes (Signed)
Cardizem drip off  HR. 90.  will continue to monitor. Saunders Revel T

## 2017-10-18 NOTE — Progress Notes (Signed)
I&O cath pt with Erlene Quan RN.  Sterile technique used.   1000 urine output.  Will continue to monitor. Saunders Revel T

## 2017-10-18 NOTE — Progress Notes (Signed)
HR- 150's a-fib  after giving the dose of cardizem po,asypmtomatic, HR fluctuating to  110's-150   for few min.. Family at bedside.MD made  aware. Gave order to just monitor pt. for now. Latest hr- 111 , dozing off.

## 2017-10-18 NOTE — Progress Notes (Signed)
ANTICOAGULATION CONSULT NOTE - Initial Consult  Pharmacy Consult for apixaban Indication: atrial fibrillation  Not on File  Patient Measurements: Height: 5\' 3"  (160 cm) Weight: 187 lb 9.8 oz (85.1 kg) IBW/kg (Calculated) : 52.4  Vital Signs: Temp: 98.2 F (36.8 C) (03/31 1134) Temp Source: Oral (03/31 1134) BP: 97/49 (03/31 1400) Pulse Rate: 113 (03/31 1430)  Labs: Recent Labs    10/15/17 1814  10/15/17 2137 10/15/17 2250 10/16/17 0701 10/18/17 0737  HGB  --    < > 7.9*  --  8.2* 8.1*  HCT  --   --  23.7*  --  24.4* 25.3*  PLT  --   --   --   --  382 352  CREATININE 1.12*  --   --  0.99 0.95 0.79  TROPONINI <0.03  --   --   --   --   --    < > = values in this interval not displayed.    Estimated Creatinine Clearance: 60.9 mL/min (by C-G formula based on SCr of 0.79 mg/dL).   Medical History: Past Medical History:  Diagnosis Date  . Diabetes mellitus without complication ([place])   . Hypertension     Medications:  Medications Prior to Admission  Medication Sig Dispense Refill Last Dose  . amLODipine (NORVASC) 10 MG tablet Take 10 mg by mouth daily.   10/14/2017 at Unknown time  . aspirin EC 81 MG tablet Take 81 mg by mouth daily.   10/14/2017 at Unknown time  . BISACODYL LAXATIVE PO Take 1 tablet by mouth as needed (constipation).   10/12/2017  . docusate sodium (COLACE) 100 MG capsule Take 200 mg by mouth as needed for mild constipation.   10/14/2017 at Unknown time  . lisinopril-hydrochlorothiazide (PRINZIDE,ZESTORETIC) 20-12.5 MG tablet Take 2 tablets by mouth daily.   10/14/2017 at Unknown time  . Menthol-Methyl Salicylate (ICY HOT) 28-78 % STCK Apply 1 application topically as needed (Back and Neck pain).   10/13/2017  . metFORMIN (GLUCOPHAGE) 500 MG tablet Take 500 mg by mouth daily.   10/14/2017 at Unknown time  . sertraline (ZOLOFT) 100 MG tablet Take 200 mg by mouth daily.   10/14/2017 at Unknown time    Assessment: 1 YOF with new Afib to start apixaban for  anticoagulation. H/H low stable. Plt wnl. SCr 0.78.   Goal of Therapy:  Monitor platelets by anticoagulation protocol: Yes   Plan:  -Apixaban 5 mg twice daily -Monitor closely for s/s of bleeding -Educate patient on apixaban  Albertina Parr, PharmD., BCPS Clinical Pharmacist Clinical phone for 10/18/17 until 3:30pm: [phone] If after 3:30pm, please call main pharmacy at: [phone]

## 2017-10-18 NOTE — Progress Notes (Signed)
Md made aware of bladder scan 681. Pt had only 175 urine output since 1900. Flomax given early at 1752.  No I&O earlier today.  Will continue to monitor. Saunders Revel T

## 2017-10-18 NOTE — Progress Notes (Signed)
Patient Demographics:    Anna Cuevas, is a 77 y.o. female, DOB - Oct 21, 1940, HWE:[id number]  Admit date - 10/15/2017   Admitting Physician Elwin Mocha, MD  Outpatient Primary MD for the patient is [name], MD  LOS - 3   Chief Complaint  Patient presents with  . Atrial Fibrillation  . Emesis        Subjective:    Anna Cuevas today has no fevers, no emesis,  No chest pain, patient is very weak, having difficulty with transfers, becomes tachycardic with transfers,  Assessment  & Plan :    Principal Problem:   Atrial fibrillation with rapid ventricular response (HCC) Active Problems:   Hyponatremia   N&V (nausea and vomiting)   Diabetes mellitus without complication (HCC)   Pressure injury of skin  Brief summary 77 year old female with past medical history relevant for diabetes, possible CKD stage III, hypertension and anemia admitted on 10/15/2017 with nausea vomiting/diarrhea and resulting dehydration and hyponatremia as well as A. fib with RVR started on IV Cardizem drip, now on po cardizem   Assessment & Plan:      1)Atrial Fibrillation with Rapid Ventricular response ([place])- rate control remains challenging, continue Cardizem 60 mg p.o. every 6 hours, will titrate up p.o. Cardizem as needed for better rate control , give metoprolol 12.5 mg 3 times daily , echo with EF over 65%  , magnesium and potassium are normal, TSH is 0.78. CHA2DS2-Vasc score-is = 5  Which is  equal to = 7.2% risk of stroke or 10% risk of stroke/TIA/systemic embolism, HASBLED score = 1 with risk of major bleeding of 1 in 100 patient years, patient and family members have now decided to do Eliquis, patient has anemia but no obvious bleeding, pharmacy consult for Eliquis therapy for nonvalvular A. Fib.  Risk versus benefit of anticoagulation discussed with patient and family  2)Anemia- ??? Baseline, suspect  chronic anemia of CKD exacerbated by hemodilution secondary to IV fluids, stool occult blood is negative, iron studies are low normal, B12 and folate are not low, hemoglobin is down to 8.1 from 9.2, no obvious bleeding  3)CKD III Vs AKI-baseline renal function not available, creatinine is down to 1.26 after hydration from 1.43, hold lisinopril HCTZ due to kidney concerns at this time, avoid nephrotoxic agents  4)DM-hold metformin, allow some permissive Hyperglycemia rather than risk life-threatening hypoglycemia in a patient with unreliable oral intake. Use Novolog/Humalog Sliding scale insulin with Accu-Cheks, Fingersticks as ordered   5)HTN-we will, currently on p.o. Cardizem as above for rate control , metoprolol 12.5 minutes 3 times daily added on 10/18/2017 , prior to admission patient was on lisinopril HCT and amlodipine,  may use IV Hydralazine 10 mg  Every 4 hours Prn for systolic blood pressure over 160 mmhg   6)Disposition-patient is very weak, requiring assist of 2 to transfer,, son-in-law apparently is an occupational therapist, physical therapist suggested skilled nursing facility placement but patient and family are leaning to was discharged home with home health  7) buttock and sacral decubiti-wound care consult appreciated  8)Urinary retention-in and out catheter done, Flomax started, consider indwelling Foley if urinary retention persist   DVT prophylaxis: SCDs ,Eliquis anticoagulation for A. fib code Status: Full  family Communication:  Daughter  disposition Plan: When heart rate is better controlled on oral medications (Home with home health versus skilled nursing facility)   Lab Results  Component Value Date   PLT 352 10/18/2017    Inpatient Medications  Scheduled Meds: . apixaban  5 mg Oral BID  . aspirin  81 mg Oral Daily  . diltiazem  60 mg Oral Q6H  . insulin aspart  0-15 Units Subcutaneous TID WC  . pantoprazole  40 mg Oral Daily  . sertraline  200 mg Oral  Daily  . sodium chloride  2 spray Each Nare TID  . tamsulosin  0.4 mg Oral QPC supper   Continuous Infusions:  PRN Meds:.acetaminophen **OR** acetaminophen, hydrALAZINE, ondansetron **OR** ondansetron (ZOFRAN) IV  Anti-infectives (From admission, onward)   None        Objective:   Vitals:   10/18/17 0757 10/18/17 1134 10/18/17 1400 10/18/17 1430  BP: 116/71 (!) 115/51 (!) 97/49   Pulse: 79 86 93 (!) 113  Resp: (!) 25 (!) 25 (!) 25 (!) 28  Temp: 98.2 F (36.8 C) 98.2 F (36.8 C)    TempSrc: Oral Oral    SpO2: 100% 100% 100% 100%  Weight:      Height:        Wt Readings from Last 3 Encounters:  10/18/17 85.1 kg (187 lb 9.8 oz)     Intake/Output Summary (Last 24 hours) at 10/18/2017 1534 Last data filed at 10/18/2017 0900 Gross per 24 hour  Intake 519.1 ml  Output 1375 ml  Net -855.9 ml     Physical Exam  Gen:- Awake Alert,  In no apparent distress  HEENT:- [place].AT, No sclera icterus Neck-Supple Neck,No JVD,.  Lungs-  CTAB , fair air movement CV- S1, S2 normal, irregularly irregular  Abd-  +ve B.Sounds, Abd Soft, No tenderness,    Extremity/Skin:- No  edema,   good pulses Psych-affect is appropriate, oriented x3 Neuro-no new focal deficits, no tremors   Data Review:   Micro Results Recent Results (from the past 240 hour(s))  Culture, blood (routine x 2)     Status: None (Preliminary result)   Collection Time: 10/15/17 11:10 AM  Result Value Ref Range Status   Specimen Description BLOOD RIGHT ANTECUBITAL  Final   Special Requests   Final    BOTTLES DRAWN AEROBIC AND ANAEROBIC Blood Culture adequate volume   Culture   Final    NO GROWTH 3 DAYS Performed at [facility], [place] [address], [place], [place] [address]    Report Status PENDING  Incomplete  Culture, blood (routine x 2)     Status: None (Preliminary result)   Collection Time: 10/15/17 11:25 AM  Result Value Ref Range Status   Specimen Description BLOOD RIGHT HAND  Final   Special Requests    Final    BOTTLES DRAWN AEROBIC AND ANAEROBIC Blood Culture adequate volume   Culture   Final    NO GROWTH 3 DAYS Performed at [facility], [place] [address], [place], [place] [address]    Report Status PENDING  Incomplete  Urine culture     Status: None   Collection Time: 10/15/17  2:51 PM  Result Value Ref Range Status   Specimen Description URINE, CATHETERIZED  Final   Special Requests   Final    NONE Performed at [facility], [place] [address], [place], [place] [address]    Culture NO GROWTH  Final   Report Status 10/16/2017 FINAL  Final  MRSA PCR Screening     Status: None   Collection Time: 10/16/17  2:53 PM  Result Value Ref Range Status   MRSA by PCR NEGATIVE NEGATIVE Final    Comment:        The GeneXpert MRSA Assay (FDA approved for NASAL specimens only), is one component of a comprehensive MRSA colonization surveillance program. It is not intended to diagnose MRSA infection nor to guide or monitor treatment for MRSA infections. Performed at [facility], [place] [address] [place], [place] [address]     Radiology Reports Ct Head Wo Contrast  Result Date: 10/15/2017 CLINICAL DATA:  Altered mental status EXAM: CT HEAD WITHOUT CONTRAST TECHNIQUE: Contiguous axial images were obtained from the base of the skull through the vertex without intravenous contrast. COMPARISON:  None. FINDINGS: Brain: No mass lesion, intraparenchymal hemorrhage or extra-axial collection. No evidence of acute cortical infarct. Normal appearance of the brain parenchyma and extra axial spaces for age. Vascular: No hyperdense vessel or unexpected vascular calcification. Skull: Normal visualized skull base, calvarium and extracranial soft tissues. Sinuses/Orbits: No sinus fluid levels or advanced mucosal thickening. No mastoid effusion. Normal orbits. IMPRESSION: Normal brain. Electronically Signed   By: Ulyses Jarred M.D.   On: 10/15/2017 16:55   Dg Chest Port 1 View  Result Date:  10/15/2017 CLINICAL DATA:  Weakness and atrial fibrillation EXAM: PORTABLE CHEST 1 VIEW COMPARISON:  07/08/2007 FINDINGS: Cardiac shadow is within normal limits. The lungs are well aerated bilaterally. Mild mid lung interstitial changes are noted similar to that seen on prior CT examination. No focal infiltrate or sizable effusion is noted. No bony abnormality is noted. IMPRESSION: No acute abnormality seen. Electronically Signed   By: Inez Catalina M.D.   On: 10/15/2017 10:50   Dg Abd Portable 1v  Result Date: 10/15/2017 CLINICAL DATA:  Nausea vomiting.  No abdominal pain. EXAM: PORTABLE ABDOMEN - 1 VIEW COMPARISON:  None. FINDINGS: The bowel gas pattern is normal. 3 mm calcification projecting over the right renal shadow may represent a small calculus. 4.7 cm calcified uterine fibroid in the pelvis. No acute osseous abnormality. Degenerative changes of the lumbar spine. IMPRESSION: 1. No evidence of bowel obstruction. 2. Possible 3 mm right renal calculus. Electronically Signed   By: Titus Dubin M.D.   On: 10/15/2017 14:25     CBC Recent Labs  Lab 10/15/17 1110 10/15/17 1140 10/15/17 2137 10/16/17 0701 10/18/17 0737  WBC 17.3*  --   --  10.4 11.1*  HGB 9.0* 9.2* 7.9* 8.2* 8.1*  HCT 26.6* 27.0* 23.7* 24.4* 25.3*  PLT 488*  --   --  382 352  MCV 82.6  --   --  83.0 83.8  MCH 28.0  --   --  27.9 26.8  MCHC 33.8  --   --  33.6 32.0  RDW 13.9  --   --  14.1 14.0  LYMPHSABS 1.1  --   --   --   --   MONOABS 0.8  --   --   --   --   EOSABS 0.1  --   --   --   --   BASOSABS 0.0  --   --   --   --     Chemistries  Recent Labs  Lab 10/15/17 1110 10/15/17 1140 10/15/17 1814 10/15/17 2250 10/16/17 0701 10/18/17 0737  NA 124* 124* 128* 128* 130* 128*  K 3.6 3.6 3.4* 3.4* 3.3* 3.8  CL 84* 82* 90*  92* 94* 92*  CO2 27  --  25 23 24 26   GLUCOSE 314* 306* 209* 167* 188* 224*  BUN 92* 92* 78* 72* 66* 34*  CREATININE 1.43* 1.40* 1.12* 0.99 0.95 0.79  CALCIUM 8.3*  --  8.5* 8.1* 8.4* 8.2*   MG 1.7  --   --   --  1.7  --   AST 32  --   --   --   --   --   ALT 38  --   --   --   --   --   ALKPHOS 133*  --   --   --   --   --   BILITOT 0.5  --   --   --   --   --    ------------------------------------------------------------------------------------------------------------------ No results for input(s): CHOL, HDL, LDLCALC, TRIG, CHOLHDL, LDLDIRECT in the last 72 hours.  No results found for: HGBA1C ------------------------------------------------------------------------------------------------------------------ Recent Labs    10/17/17 1719  TSH 0.778   ------------------------------------------------------------------------------------------------------------------ Recent Labs    10/15/17 1814  VITAMINB12 997*  FOLATE 29.0  FERRITIN 216  TIBC 263  IRON 35  RETICCTPCT 1.7    Coagulation profile Recent Labs  Lab 10/15/17 1110  INR 1.04    No results for input(s): DDIMER in the last 72 hours.  Cardiac Enzymes Recent Labs  Lab 10/15/17 1814  TROPONINI <0.03   ------------------------------------------------------------------------------------------------------------------    Component Value Date/Time   BNP 155.3 (H) 10/15/2017 1110     Ethon Wymer M.D on 10/18/2017 at 3:34 PM  Between 7am to 7pm - Pager - [phone]  After 7pm go to [email or web address] - password [email or web address]  [facility] -  Office  [phone]   Voice Recognition Viviann Spare dictation system was used to create this note, attempts have been made to correct errors. Please contact the author with questions and/or clarifications.

## 2017-10-19 LAB — GLUCOSE, CAPILLARY
GLUCOSE-CAPILLARY: 229 mg/dL — AB (ref 65–99)
GLUCOSE-CAPILLARY: 247 mg/dL — AB (ref 65–99)
GLUCOSE-CAPILLARY: 191 mg/dL — AB (ref 65–99)
Glucose-Capillary: 228 mg/dL — ABNORMAL HIGH (ref 65–99)

## 2017-10-19 LAB — BASIC METABOLIC PANEL
ANION GAP: 9 (ref 5–15)
BUN: 29 mg/dL — ABNORMAL HIGH (ref 6–20)
CHLORIDE: 92 mmol/L — AB (ref 101–111)
CO2: 27 mmol/L (ref 22–32)
CREATININE: 0.78 mg/dL (ref 0.44–1.00)
Calcium: 8.4 mg/dL — ABNORMAL LOW (ref 8.9–10.3)
GFR calc non Af Amer: >60 mL/min (ref >60)
Glucose, Bld: 192 mg/dL — ABNORMAL HIGH (ref 65–99)
POTASSIUM: 4 mmol/L (ref 3.5–5.1)
SODIUM: 128 mmol/L — AB (ref 135–145)

## 2017-10-19 LAB — PREPARE RBC (CROSSMATCH)

## 2017-10-19 LAB — CBC
HEMATOCRIT: 23.2 % — AB (ref 36.0–46.0)
Hemoglobin: 7.7 g/dL — ABNORMAL LOW (ref 12.0–15.0)
MCH: 28 pg (ref 26.0–34.0)
MCHC: 33.2 g/dL (ref 30.0–36.0)
MCV: 84.4 fL (ref 78.0–100.0)
Platelets: 346 10*3/uL (ref 150–400)
RBC: 2.75 MIL/uL — AB (ref 3.87–5.11)
RDW: 14.5 % (ref 11.5–15.5)
WBC: 9.5 10*3/uL (ref 4.0–10.5)

## 2017-10-19 MED ORDER — FUROSEMIDE 10 MG/ML IJ SOLN: 20.0000 mg | Freq: Once | INTRAMUSCULAR | Status: DC

## 2017-10-19 MED ORDER — METOPROLOL TARTRATE 25 MG PO TABS
25.0000 mg | ORAL_TABLET | Freq: Three times a day (TID) | ORAL | Status: DC
  Administered 2017-10-19 – 2017-10-20 (×4): 25 mg via ORAL
  Filled 2017-10-19 (×4): qty 1

## 2017-10-19 MED ORDER — SODIUM CHLORIDE 0.9 % IV SOLN
Freq: Once | INTRAVENOUS | Status: AC
  Administered 2017-10-19: 10 mL via INTRAVENOUS

## 2017-10-19 MED ORDER — SENNOSIDES-DOCUSATE SODIUM 8.6-50 MG PO TABS
2.0000 | ORAL_TABLET | Freq: Once | ORAL | Status: AC
  Administered 2017-10-19: 2 via ORAL
  Filled 2017-10-19: qty 2

## 2017-10-19 MED ORDER — POLYETHYLENE GLYCOL 3350 17 G PO PACK
17.0000 g | PACK | Freq: Every day | ORAL | Status: DC
  Administered 2017-10-19 – 2017-10-20 (×2): 17 g via ORAL
  Filled 2017-10-19 (×2): qty 1

## 2017-10-19 MED ORDER — DILTIAZEM HCL 60 MG PO TABS
90.0000 mg | ORAL_TABLET | Freq: Four times a day (QID) | ORAL | Status: DC
  Administered 2017-10-19 – 2017-10-20 (×6): 90 mg via ORAL
  Filled 2017-10-19 (×6): qty 2

## 2017-10-19 NOTE — Progress Notes (Signed)
Patient Demographics:    Anna Cuevas, is a 77 y.o. female, DOB - Feb 26, 1941, LYY:[id number]  Admit date - 10/15/2017   Admitting Physician Elwin Mocha, MD  Outpatient Primary MD for the patient is [name], MD  LOS - 4   Chief Complaint  Patient presents with  . Atrial Fibrillation  . Emesis        Subjective:    Anna Cuevas today has no fevers, no emesis,  No chest pain, patient is very weak, tachycardia and dizziness/palpitations with activity persist  Assessment  & Plan :    Principal Problem:   Atrial fibrillation with rapid ventricular response (HCC) Active Problems:   Hyponatremia   N&V (nausea and vomiting)   Diabetes mellitus without complication (HCC)   Pressure injury of skin  Brief summary 77 year old female with past medical history relevant for diabetes, possible CKD stage III, hypertension and anemia admitted on 10/15/2017 with nausea vomiting/diarrhea and resulting dehydration and hyponatremia as well as A. fib with RVR started on IV Cardizem drip, now on po cardizem and metoprolol   Assessment & Plan:      1)Atrial Fibrillation with Rapid Ventricular response ([facility])- rate control remains challenging, will increase metoprolol to 25 mg 3 times daily, also increase Cardizem to 90 mg 3 times daily for better rate control , echo with EF over 65%  , magnesium and potassium are normal, TSH is 0.78. CHA2DS2-Vasc score-is = 5  Which is  equal to = 7.2% risk of stroke or 10% risk of stroke/TIA/systemic embolism, HASBLED score = 1 with risk of major bleeding of 1 in 100 patient years, patient and family members have now decided to do Eliquis, patient has anemia but no obvious bleeding, pharmacy consult for Eliquis therapy for nonvalvular A. Fib.  Risk versus benefit of anticoagulation discussed with patient and family  2)Anemia- ??? Baseline, suspect chronic anemia of CKD  exacerbated by hemodilution secondary to IV fluids, stool occult blood is negative, iron studies are low normal, B12 and folate are not low, hemoglobin is down to 7.7 from 9.2, no obvious bleeding, patient has symptomatic anemia with dizziness palpitations and tachycardia with minimal activity, transfuse 1 unit of packed cells for symptomatic anemia. Risk, benefits and alternatives to transfusion of blood products discussed. Indication for transfusion discussed. Consent obtained  3)CKD III Vs AKI-baseline renal function not available, creatinine is down to 1.26 after hydration from 1.43, hold lisinopril HCTZ due to kidney concerns at this time, avoid nephrotoxic agents  4)DM-hold metformin,  Use Novolog/Humalog Sliding scale insulin with Accu-Cheks, Fingersticks as ordered   5)HTN- c/n metoprolol and Cardizem as above #1, prior to admission patient was on lisinopril HCT and amlodipine,  may use IV Hydralazine 10 mg  Every 4 hours Prn for systolic blood pressure over 160 mmhg   6)Disposition-patient is very weak, requiring assist of 2 to transfer,, son-in-law apparently is an occupational therapist, physical therapist suggested skilled nursing facility placement but patient and family are leaning to was discharged home with home health  7) buttock and sacral decubiti-wound care consult appreciated  8)Urinary retention-in and out catheter done, Flomax started, will remove indwelling Foley and do a voiding trial   DVT prophylaxis: SCDs ,Eliquis anticoagulation for A. fib code  Status: Full  family Communication: Daughter  disposition Plan: When heart rate is better controlled on oral medications (Home with home health versus skilled nursing facility)   Lab Results  Component Value Date   PLT 346 10/19/2017    Inpatient Medications  Scheduled Meds: . apixaban  5 mg Oral BID  . diltiazem  90 mg Oral Q6H  . furosemide  20 mg Intravenous Once  . insulin aspart  0-15 Units Subcutaneous TID WC   . metoprolol tartrate  25 mg Oral TID  . pantoprazole  40 mg Oral Daily  . polyethylene glycol  17 g Oral Daily  . sertraline  200 mg Oral Daily  . sodium chloride  2 spray Each Nare TID  . tamsulosin  0.4 mg Oral QPC supper   Continuous Infusions:  PRN Meds:.acetaminophen **OR** acetaminophen, hydrALAZINE, ondansetron **OR** ondansetron (ZOFRAN) IV  Anti-infectives (From admission, onward)   None        Objective:   Vitals:   10/19/17 1516 10/19/17 1650 10/19/17 1716 10/19/17 1800  BP: 112/64 (!) 94/47 (!) 111/50   Pulse: 94 70 69 76  Resp: 18 18 17 17   Temp: 97.6 F (36.4 C) 98.1 F (36.7 C) 98.3 F (36.8 C)   TempSrc: Axillary Oral Oral   SpO2: 100%  97% 100%  Weight:      Height:        Wt Readings from Last 3 Encounters:  10/18/17 85.1 kg (187 lb 9.8 oz)     Intake/Output Summary (Last 24 hours) at 10/19/2017 1842 Last data filed at 10/19/2017 1716 Gross per 24 hour  Intake 1126 ml  Output 465 ml  Net 661 ml     Physical Exam  Gen:- Awake Alert,  In no apparent distress  HEENT:- [place].AT, No sclera icterus Neck-Supple Neck,No JVD,.  Lungs-  CTAB , fair air movement CV- S1, S2 normal, irregularly irregular  Abd-  +ve B.Sounds, Abd Soft, No tenderness,    Extremity/Skin:- No  edema,   good pulses Psych-affect is appropriate, oriented x3 Neuro-no new focal deficits, no tremors GU-Foley catheter to be removed  Data Review:   Micro Results Recent Results (from the past 240 hour(s))  Culture, blood (routine x 2)     Status: None (Preliminary result)   Collection Time: 10/15/17 11:10 AM  Result Value Ref Range Status   Specimen Description BLOOD RIGHT ANTECUBITAL  Final   Special Requests   Final    BOTTLES DRAWN AEROBIC AND ANAEROBIC Blood Culture adequate volume   Culture   Final    NO GROWTH 4 DAYS Performed at [facility], [place] [address], [place], [place] [address]    Report Status PENDING  Incomplete  Culture, blood (routine x 2)      Status: None (Preliminary result)   Collection Time: 10/15/17 11:25 AM  Result Value Ref Range Status   Specimen Description BLOOD RIGHT HAND  Final   Special Requests   Final    BOTTLES DRAWN AEROBIC AND ANAEROBIC Blood Culture adequate volume   Culture   Final    NO GROWTH 4 DAYS Performed at [facility], [place] [address], [place], [place] [address]    Report Status PENDING  Incomplete  Urine culture     Status: None   Collection Time: 10/15/17  2:51 PM  Result Value Ref Range Status   Specimen Description URINE, CATHETERIZED  Final   Special Requests   Final    NONE Performed at [facility]  [facility], [place] [address] [place], [place] [address]    Culture NO GROWTH  Final   Report Status 10/16/2017 FINAL  Final  MRSA PCR Screening     Status: None   Collection Time: 10/16/17  2:53 PM  Result Value Ref Range Status   MRSA by PCR NEGATIVE NEGATIVE Final    Comment:        The GeneXpert MRSA Assay (FDA approved for NASAL specimens only), is one component of a comprehensive MRSA colonization surveillance program. It is not intended to diagnose MRSA infection nor to guide or monitor treatment for MRSA infections. Performed at [facility], [place] [address] [place], [place] [address]     Radiology Reports Ct Head Wo Contrast  Result Date: 10/15/2017 CLINICAL DATA:  Altered mental status EXAM: CT HEAD WITHOUT CONTRAST TECHNIQUE: Contiguous axial images were obtained from the base of the skull through the vertex without intravenous contrast. COMPARISON:  None. FINDINGS: Brain: No mass lesion, intraparenchymal hemorrhage or extra-axial collection. No evidence of acute cortical infarct. Normal appearance of the brain parenchyma and extra axial spaces for age. Vascular: No hyperdense vessel or unexpected vascular calcification. Skull: Normal visualized skull base, calvarium and extracranial soft tissues. Sinuses/Orbits: No sinus fluid levels or advanced mucosal thickening. No  mastoid effusion. Normal orbits. IMPRESSION: Normal brain. Electronically Signed   By: Ulyses Jarred M.D.   On: 10/15/2017 16:55   Dg Chest Port 1 View  Result Date: 10/15/2017 CLINICAL DATA:  Weakness and atrial fibrillation EXAM: PORTABLE CHEST 1 VIEW COMPARISON:  07/08/2007 FINDINGS: Cardiac shadow is within normal limits. The lungs are well aerated bilaterally. Mild mid lung interstitial changes are noted similar to that seen on prior CT examination. No focal infiltrate or sizable effusion is noted. No bony abnormality is noted. IMPRESSION: No acute abnormality seen. Electronically Signed   By: Inez Catalina M.D.   On: 10/15/2017 10:50   Dg Abd Portable 1v  Result Date: 10/15/2017 CLINICAL DATA:  Nausea vomiting.  No abdominal pain. EXAM: PORTABLE ABDOMEN - 1 VIEW COMPARISON:  None. FINDINGS: The bowel gas pattern is normal. 3 mm calcification projecting over the right renal shadow may represent a small calculus. 4.7 cm calcified uterine fibroid in the pelvis. No acute osseous abnormality. Degenerative changes of the lumbar spine. IMPRESSION: 1. No evidence of bowel obstruction. 2. Possible 3 mm right renal calculus. Electronically Signed   By: Titus Dubin M.D.   On: 10/15/2017 14:25     CBC Recent Labs  Lab 10/15/17 1110 10/15/17 1140 10/15/17 2137 10/16/17 0701 10/18/17 0737 10/19/17 0602  WBC 17.3*  --   --  10.4 11.1* 9.5  HGB 9.0* 9.2* 7.9* 8.2* 8.1* 7.7*  HCT 26.6* 27.0* 23.7* 24.4* 25.3* 23.2*  PLT 488*  --   --  382 352 346  MCV 82.6  --   --  83.0 83.8 84.4  MCH 28.0  --   --  27.9 26.8 28.0  MCHC 33.8  --   --  33.6 32.0 33.2  RDW 13.9  --   --  14.1 14.0 14.5  LYMPHSABS 1.1  --   --   --   --   --   MONOABS 0.8  --   --   --   --   --   EOSABS 0.1  --   --   --   --   --   BASOSABS 0.0  --   --   --   --   --  Chemistries  Recent Labs  Lab 10/15/17 1110  10/15/17 1814 10/15/17 2250 10/16/17 0701 10/18/17 0737 10/19/17 0602  NA 124*   < > 128* 128* 130*  128* 128*  K 3.6   < > 3.4* 3.4* 3.3* 3.8 4.0  CL 84*   < > 90* 92* 94* 92* 92*  CO2 27  --  25 23 24 26 27   GLUCOSE 314*   < > 209* 167* 188* 224* 192*  BUN 92*   < > 78* 72* 66* 34* 29*  CREATININE 1.43*   < > 1.12* 0.99 0.95 0.79 0.78  CALCIUM 8.3*  --  8.5* 8.1* 8.4* 8.2* 8.4*  MG 1.7  --   --   --  1.7  --   --   AST 32  --   --   --   --   --   --   ALT 38  --   --   --   --   --   --   ALKPHOS 133*  --   --   --   --   --   --   BILITOT 0.5  --   --   --   --   --   --    < > = values in this interval not displayed.   ------------------------------------------------------------------------------------------------------------------ No results for input(s): CHOL, HDL, LDLCALC, TRIG, CHOLHDL, LDLDIRECT in the last 72 hours.  No results found for: HGBA1C ------------------------------------------------------------------------------------------------------------------ Recent Labs    10/17/17 1719  TSH 0.778   ------------------------------------------------------------------------------------------------------------------ No results for input(s): VITAMINB12, FOLATE, FERRITIN, TIBC, IRON, RETICCTPCT in the last 72 hours.  Coagulation profile Recent Labs  Lab 10/15/17 1110  INR 1.04    No results for input(s): DDIMER in the last 72 hours.  Cardiac Enzymes Recent Labs  Lab 10/15/17 1814  TROPONINI <0.03   ------------------------------------------------------------------------------------------------------------------    Component Value Date/Time   BNP 155.3 (H) 10/15/2017 1110     Jeffren Dombek M.D on 10/19/2017 at 6:42 PM  Between 7am to 7pm - Pager - [phone]  After 7pm go to [email or web address] - password [email or web address]  [facility] -  Office  [phone]   Voice Recognition Viviann Spare dictation system was used to create this note, attempts have been made to correct errors. Please contact the author with questions and/or clarifications.

## 2017-10-19 NOTE — Progress Notes (Signed)
Physical Therapy Treatment Patient Details Name: Anna Cuevas MRN: [id number] DOB: 1941/06/22 Today's Date: 10/19/2017    History of Present Illness Pt is a 77 y/o female admitted secondary to nausea/vomiting and progressive wewakness. Upon arrival found to have a fib with RVR. CT of head, chest imaging, and abdominal imaging negative for acute abnormality. PMH includes DM and HTN.     PT Comments    Pt making slow but steady progress. Pt's daughter plans to take pt home and provide 24 hour assistance. Daughter feels comfortable providing care. Today pt stood x 2 with walker. After brief period standing pt with decreasing responsiveness and leaning to left. BP taken when returned to sitting EOB and was 91/64. Took BP when standing second time and it was 89/48. Returned pt to supine with BP then 112/64. Pt to get unit of blood today.    Follow Up Recommendations  Home health PT;Supervision/Assistance - 24 hour     Equipment Recommendations  Wheelchair (measurements PT);Hospital bed    Recommendations for Other Services       Precautions / Restrictions Precautions Precautions: Fall Restrictions Weight Bearing Restrictions: No    Mobility  Bed Mobility Overal bed mobility: Needs Assistance Bed Mobility: Rolling;Sidelying to Sit;Sit to Sidelying Rolling: Mod assist Sidelying to sit: Mod assist;HOB elevated     Sit to sidelying: Mod assist General bed mobility comments: Assist to bring hips up when rolling. Assist to elevate trunk into sitting and bring hips to the EOB using bed pad. Assist to bring legs back up into bed and lower trunk returning to sidelying. Pt required verbal cues with all for techniquie. Incr time for all.  Transfers Overall transfer level: Needs assistance Equipment used: Rolling walker (2 wheeled) Transfers: Sit to/from Stand Sit to Stand: Mod assist;+2 physical assistance;Min assist         General transfer comment: On first sit to stand pt  required +2 min assist to bring hips up and for balance and required incr time to rise. On second sit to stand pt required +2 mod assist for the same.  Ambulation/Gait Ambulation/Gait assistance: +2 physical assistance;Mod assist Ambulation Distance (Feet): (2 very, very small steps) Assistive device: Rolling walker (2 wheeled) Gait Pattern/deviations: Step-to pattern;Decreased step length - right;Decreased step length - left;Shuffle;Trunk flexed Gait velocity: decr Gait velocity interpretation: Below normal speed for age/gender General Gait Details: Pt with difficulty advancing either foot more than a few inches. Pt then reported feeling lightheaded and began to lean lt and become slower to respond. Returned pt to sitting and checked BP which was 91/64 sitting EOB.   Stairs            Wheelchair Mobility    Modified Rankin (Stroke Patients Only)       Balance Overall balance assessment: Needs assistance Sitting-balance support: Bilateral upper extremity supported;Feet supported Sitting balance-Leahy Scale: Poor Sitting balance - Comments: Requires UE support and min guard for static sitting.    Standing balance support: Bilateral upper extremity supported Standing balance-Leahy Scale: Poor Standing balance comment: Walker and +2 min to mod with static standing. Pt stood first time for 2 minutes. Stood 2nd time for 1 minute.                            Cognition Arousal/Alertness: Awake/alert Behavior During Therapy: WFL for tasks assessed/performed Overall Cognitive Status: Impaired/Different from baseline Area of Impairment: Problem solving  Problem Solving: Slow processing;Decreased initiation;Difficulty sequencing;Requires verbal cues;Requires tactile cues        Exercises      General Comments        Pertinent Vitals/Pain      Home Living                      Prior Function            PT  Goals (current goals can now be found in the care plan section) Progress towards PT goals: Progressing toward goals    Frequency    Min 3X/week      PT Plan Equipment recommendations need to be updated    Co-evaluation PT/OT/SLP Co-Evaluation/Treatment: Yes Reason for Co-Treatment: Complexity of the patient's impairments (multi-system involvement);For patient/therapist safety PT goals addressed during session: Mobility/safety with mobility;Balance        AM-PAC PT "6 Clicks" Daily Activity  Outcome Measure  Difficulty turning over in bed (including adjusting bedclothes, sheets and blankets)?: Unable Difficulty moving from lying on back to sitting on the side of the bed? : Unable Difficulty sitting down on and standing up from a chair with arms (e.g., wheelchair, bedside commode, etc,.)?: Unable Help needed moving to and from a bed to chair (including a wheelchair)?: A Lot Help needed walking in hospital room?: Total Help needed climbing 3-5 steps with a railing? : Total 6 Click Score: 7    End of Session Equipment Utilized During Treatment: Gait belt Activity Tolerance: Patient limited by fatigue Patient left: with call bell/phone within reach;with family/visitor present;in bed;Other (comment)(bed in chair position) Nurse Communication: Mobility status PT Visit Diagnosis: Unsteadiness on feet (R26.81);Muscle weakness (generalized) (M62.81);Other abnormalities of gait and mobility (R26.89)     Time: 4008-6761 PT Time Calculation (min) (ACUTE ONLY): 31 min  Charges:  $Therapeutic Activity: 8-22 mins                    G Codes:       [facility] PT [place] 10/19/2017, 4:32 PM

## 2017-10-19 NOTE — Progress Notes (Signed)
Occupational Therapy Evaluation Patient Details Name: Anna Cuevas MRN: [id number] DOB: [birth year] Today's Date: 10/19/2017    History of Present Illness Pt is a 77 y/o female admitted secondary to nausea/vomiting and progressive wewakness. Upon arrival found to have a fib with RVR. CT of head, chest imaging, and abdominal imaging negative for acute abnormality. PMH includes DM and HTN.    Clinical Impression   PTA, pt lived at home with her family and was independent with ADL and mobility until 2 weeks PTA. Pt currently requires +2 Mod A for mobilty and Max A with LB ADL. Pt apparently orthostatic during session  - see PT note. Will follow acutely to facilitae safe DC home. Pt's son is an OT and states they will be able to manage pt at home. Pt will need wc and hospital bed for safe DC home. Pt may benefit form TEDs to assist with BP.     Follow Up Recommendations  Supervision/Assistance - 24 hour(son to provide OT)    Equipment Recommendations  Hospital bed;Wheelchair (measurements OT);Wheelchair cushion (measurements OT)    Recommendations for Other Services       Precautions / Restrictions Precautions Precautions: Fall Restrictions Weight Bearing Restrictions: No      Mobility Bed Mobility Overal bed mobility: Needs Assistance Bed Mobility: Rolling;Sidelying to Sit;Sit to Sidelying Rolling: Mod assist Sidelying to sit: Mod assist;HOB elevated     Sit to sidelying: Mod assist General bed mobility comments: Assist to bring hips up when rolling. Assist to elevate trunk into sitting and bring hips to the EOB using bed pad. Assist to bring legs back up into bed and lower trunk returning to sidelying. Pt required verbal cues with all for techniquie. Incr time for all.  Transfers Overall transfer level: Needs assistance Equipment used: Rolling walker (2 wheeled) Transfers: Sit to/from Stand Sit to Stand: Mod assist;+2 physical assistance;Min assist         General  transfer comment: On first sit to stand pt required +2 min assist to bring hips up and for balance and required incr time to rise. On second sit to stand pt required +2 mod assist for the same.    Balance Overall balance assessment: Needs assistance Sitting-balance support: Bilateral upper extremity supported;Feet supported Sitting balance-Leahy Scale: Poor Sitting balance - Comments: Requires UE support and min guard for static sitting.    Standing balance support: Bilateral upper extremity supported Standing balance-Leahy Scale: Poor Standing balance comment: Walker and +2 min to mod with static standing. Pt stood first time for 2 minutes. Stood 2nd time for 1 minute.                           ADL either performed or assessed with clinical judgement   ADL Overall ADL's : Needs assistance/impaired Eating/Feeding: Supervision/ safety;Set up   Grooming: Minimal assistance;Sitting   Upper Body Bathing: Minimal assistance;Sitting   Lower Body Bathing: Sit to/from stand;Moderate assistance   Upper Body Dressing : Minimal assistance;Sitting   Lower Body Dressing: Maximal assistance;Sit to/from stand   Toilet Transfer: Moderate assistance;RW   Toileting- Clothing Manipulation and Hygiene: Moderate assistance;Sit to/from stand       Functional mobility during ADLs: Moderate assistance;+2 for physical assistance;Rolling walker;Cueing for safety;Cueing for sequencing       Vision         Perception     Praxis      Pertinent Vitals/Pain Pain Assessment: Faces Faces Pain Scale: Hurts little  more Pain Location: L ribs; buttocks Pain Descriptors / Indicators: Sore Pain Intervention(s): Limited activity within patient's tolerance     Hand Dominance Right   Extremity/Trunk Assessment Upper Extremity Assessment Upper Extremity Assessment: Generalized weakness   Lower Extremity Assessment Lower Extremity Assessment: Defer to PT evaluation LLE Deficits /  Details: L hip bursitis at baseline.    Cervical / Trunk Assessment Cervical / Trunk Assessment: Kyphotic   Communication Communication Communication: No difficulties   Cognition Arousal/Alertness: Awake/alert Behavior During Therapy: WFL for tasks assessed/performed Overall Cognitive Status: Impaired/Different from baseline Area of Impairment: Problem solving                             Problem Solving: Slow processing;Decreased initiation;Difficulty sequencing;Requires verbal cues;Requires tactile cues     General Comments       Exercises     Shoulder Instructions      Home Living Family/patient expects to be discharged to:: Private residence Living Arrangements: Alone Available Help at Discharge: Family;Available 24 hours/day Type of Home: House Home Access: Stairs to enter [employer] of Steps: 1(threshold step ) Entrance Stairs-Rails: None Home Layout: One level     Bathroom Shower/Tub: Occupational psychologist: Standard Bathroom Accessibility: Yes How Accessible: Accessible via walker Home Equipment: [place] - 2 wheels;Bedside commode;Shower seat   Additional Comments: Pt's daughter reports they check on her multiple times a day.       Prior Functioning/Environment Level of Independence: Needs assistance  Gait / Transfers Assistance Needed: Reports for the past two weeks has been having to use a RW secondary to weakness. Initially requires assist to stand in the mornings. Prior was independent.  ADL's / Homemaking Assistance Needed: Reports needing assist with bathing and dressing the past couple of weeks. Prior to sickness, was independent.    Comments: was independent prior to 2 weeks ago        OT Problem List: Decreased strength;Decreased activity tolerance;Impaired balance (sitting and/or standing);Decreased safety awareness;Decreased knowledge of use of DME or AE;Cardiopulmonary status limiting activity;Obesity;Pain       OT Treatment/Interventions: Self-care/ADL training;Therapeutic exercise;Energy conservation;DME and/or AE instruction;Therapeutic activities;Patient/family education;Balance training    OT Goals(Current goals can be found in the care plan section) Acute Rehab OT Goals Patient Stated Goal: to go home  OT Goal Formulation: With patient Time For Goal Achievement: 11/02/17 Potential to Achieve Goals: Good  OT Frequency: Min 3X/week   Barriers to D/C:            Co-evaluation PT/OT/SLP Co-Evaluation/Treatment: Yes Reason for Co-Treatment: Complexity of the patient's impairments (multi-system involvement);To address functional/ADL transfers PT goals addressed during session: Mobility/safety with mobility;Balance OT goals addressed during session: ADL's and self-care      AM-PAC PT "6 Clicks" Daily Activity     Outcome Measure Help from another person eating meals?: None Help from another person taking care of personal grooming?: A Little Help from another person toileting, which includes using toliet, bedpan, or urinal?: A Lot Help from another person bathing (including washing, rinsing, drying)?: A Lot Help from another person to put on and taking off regular upper body clothing?: A Little Help from another person to put on and taking off regular lower body clothing?: A Lot 6 Click Score: 16   End of Session Equipment Utilized During Treatment: Gait belt;Rolling walker Nurse Communication: Mobility status  Activity Tolerance: Patient tolerated treatment well Patient left: in bed;with call bell/phone within reach;with family/visitor  present  OT Visit Diagnosis: Other abnormalities of gait and mobility (R26.89);Pain;Muscle weakness (generalized) (M62.81) Pain - part of body: (buttocks/hip)                Time: 1749-4496 OT Time Calculation (min): 31 min Charges:  OT General Charges $OT Visit: 1 Visit OT Evaluation $OT Eval Moderate Complexity: 1 Mod G-Codes:     [place], OT/L  [phone] 10/19/2017  Anna Cuevas,Anna Cuevas 10/19/2017, 5:20 PM

## 2017-10-19 NOTE — Care Management Important Message (Signed)
Important Message  Patient Details  Name: Anna Cuevas MRN: [id number] Date of Birth: [birth year]   Medicare Important Message Given:  Yes    Orbie Pyo 10/19/2017, 2:40 PM

## 2017-10-19 NOTE — Care Management Note (Addendum)
Case Management Note  Patient Details  Name: Anna Cuevas MRN: [id number] Date of Birth: November 13, 1940  Subjective/Objective:    Pt admitted with SOB                Action/Plan:  PTA from home.  SNF recommended however pt and family refusing.  Pts family state they will provide 24/7 supervision at discharge.  CM left HH list at bedside.  CM requested HH/face to face order from attending.   Expected Discharge Date:                  Expected Discharge Plan:  [place]  In-House Referral:     Discharge planning Services  CM Consult  Post Acute Care Choice:    Choice offered to:  Adult Children, Patient  DME Arranged:  Hospital bed, Wheelchair manual DME Agency:  [place]:  RN, PT [facility] Agency:  [facility]  Status of Service:  In process, will continue to follow  If discussed at Long Length of Stay Meetings, dates discussed:    Additional Comments: 10/20/17 Pt to discharge home today.  Pt will discharge home via private vehicle driven by son in law.  CM provided free 30day card and checked with pharmacy of choice [facility] [address] and was informed that pharmacy can fill script.  CM informed daughter of ongoing copay for Eliquis.  Equipment scheduled to deliver today between 5 and 9pm. Benefit check . ELIQUIS 2.5 MG BID  COVER- YES  CO-PAY- $ 45.00  TIER- 3 DRUG  PRIOR APPROVAL- NO   2. ELIQUIS 5 MG BID  COVER- YES  CO-PAY- $ 45.00  TIER- 3 DRUG  PRIOR APPROVAL- NO   PREFERRED PHARMACY : [facility] - [facility]   10/19/2017  Pt/daughter chose [facility] - unfortunately agency declined to take pt, additionally [facility] also declined to [facility] pt.  Pt was accepted by [facility] - both pt and daughter in agreement with [facility].  CM provided DME referral to [facility] - referral accepted.   Pt already has following equipment in the home; rolling walker, cane, transfer/tub bench, grab bars, 3:1.    Pt is not appropriate for  [facility] [place] due to insurance constraints Maryclare Labrador, RN 10/19/2017, 2:50 PM

## 2017-10-19 NOTE — Progress Notes (Signed)
Inpatient Diabetes Program Recommendations  AACE/ADA: New Consensus Statement on Inpatient Glycemic Control (2015)  Target Ranges:  Prepandial:   less than 140 mg/dL      Peak postprandial:   less than 180 mg/dL (1-2 hours)      Critically ill patients:  140 - 180 mg/dL   Lab Results  Component Value Date   GLUCAP 228 (H) 10/19/2017    Review of Glycemic Control Results for Anna Cuevas, Anna Cuevas (MRN [id number]) as of 10/19/2017 08:48  Ref. Range 10/18/2017 07:57 10/18/2017 12:27 10/18/2017 17:25 10/18/2017 21:40 10/19/2017 07:56  Glucose-Capillary Latest Ref Range: 65 - 99 mg/dL 211 (H) 298 (H) 169 (H) 198 (H) 228 (H)   Diabetes history: DM2 Outpatient Diabetes medications: Metformin 500 mg qd Current orders for Inpatient glycemic control: Novolog correction moderate tid  Inpatient Diabetes Program Recommendations:   -Lantus 17 units qd ( 0.2 units/kg x 85.1 kg) -A1c to determine preshospital glycemic control  Thank you, Bethena Roys E. Azael Ragain, RN, MSN, CDE  Diabetes Coordinator Inpatient Glycemic Control Team Team Pager [phone] (8am-5pm) 10/19/2017 8:58 AM

## 2017-10-19 NOTE — Progress Notes (Signed)
Bathed and assisted pt up to chair with 2 person assist. Saunders Revel T

## 2017-10-19 NOTE — Progress Notes (Addendum)
Talked with pt and planned care.  Will monitor and give sleep time.  Will do vitals again at 0500 along with bathing pt and attempt to get pt up to chair .  Saunders Revel T

## 2017-10-20 LAB — BPAM RBC
BLOOD PRODUCT EXPIRATION DATE: 201904082359
ISSUE DATE / TIME: 201904011640
Unit Type and Rh: 9500

## 2017-10-20 LAB — BASIC METABOLIC PANEL
Anion gap: 12 (ref 5–15)
BUN: 33 mg/dL — ABNORMAL HIGH (ref 6–20)
CHLORIDE: 91 mmol/L — AB (ref 101–111)
CO2: 25 mmol/L (ref 22–32)
CREATININE: 0.84 mg/dL (ref 0.44–1.00)
Calcium: 8.4 mg/dL — ABNORMAL LOW (ref 8.9–10.3)
GFR calc Af Amer: >60 mL/min (ref >60)
GFR calc non Af Amer: >60 mL/min (ref >60)
Glucose, Bld: 207 mg/dL — ABNORMAL HIGH (ref 65–99)
Potassium: 4.4 mmol/L (ref 3.5–5.1)
SODIUM: 128 mmol/L — AB (ref 135–145)

## 2017-10-20 LAB — CBC
HCT: 25.5 % — ABNORMAL LOW (ref 36.0–46.0)
HEMOGLOBIN: 8.3 g/dL — AB (ref 12.0–15.0)
MCH: 27.3 pg (ref 26.0–34.0)
MCHC: 32.5 g/dL (ref 30.0–36.0)
MCV: 83.9 fL (ref 78.0–100.0)
Platelets: 366 10*3/uL (ref 150–400)
RBC: 3.04 MIL/uL — ABNORMAL LOW (ref 3.87–5.11)
RDW: 14.6 % (ref 11.5–15.5)
WBC: 10.3 10*3/uL (ref 4.0–10.5)

## 2017-10-20 LAB — GLUCOSE, CAPILLARY
GLUCOSE-CAPILLARY: 271 mg/dL — AB (ref 65–99)
Glucose-Capillary: 211 mg/dL — ABNORMAL HIGH (ref 65–99)
Glucose-Capillary: 201 mg/dL — ABNORMAL HIGH (ref 65–99)

## 2017-10-20 LAB — CULTURE, BLOOD (ROUTINE X 2)
Culture: NO GROWTH
Culture: NO GROWTH
Special Requests: ADEQUATE
Special Requests: ADEQUATE

## 2017-10-20 LAB — TYPE AND SCREEN
ABO/RH(D): O POS
Antibody Screen: NEGATIVE
Unit division: 0

## 2017-10-20 LAB — HEMOGLOBIN A1C
HEMOGLOBIN A1C: 6.9 % — AB (ref 4.8–5.6)
MEAN PLASMA GLUCOSE: 151.33 mg/dL

## 2017-10-20 MED ORDER — ONDANSETRON HCL 4 MG PO TABS: 4.0000 mg | ORAL_TABLET | Freq: Four times a day (QID) | ORAL | 0 refills | Status: DC | PRN

## 2017-10-20 MED ORDER — APIXABAN 5 MG PO TABS: 5.0000 mg | ORAL_TABLET | Freq: Two times a day (BID) | ORAL | 1 refills | Status: DC

## 2017-10-20 MED ORDER — SALINE SPRAY 0.65 % NA SOLN: 2.0000 | Freq: Three times a day (TID) | NASAL | 1 refills | Status: DC

## 2017-10-20 MED ORDER — TAMSULOSIN HCL 0.4 MG PO CAPS: 0.4000 mg | ORAL_CAPSULE | Freq: Every day | ORAL | 0 refills | Status: DC

## 2017-10-20 MED ORDER — POLYETHYLENE GLYCOL 3350 17 G PO PACK: 17.0000 g | PACK | Freq: Every day | ORAL | 0 refills | Status: DC | PRN

## 2017-10-20 MED ORDER — PANTOPRAZOLE SODIUM 40 MG PO TBEC: 40.0000 mg | DELAYED_RELEASE_TABLET | Freq: Every day | ORAL | 0 refills | Status: DC

## 2017-10-20 MED ORDER — ACETAMINOPHEN 325 MG PO TABS: 650.0000 mg | ORAL_TABLET | Freq: Four times a day (QID) | ORAL | 11 refills | Status: DC | PRN

## 2017-10-20 MED ORDER — METOPROLOL TARTRATE 25 MG PO TABS: 25.0000 mg | ORAL_TABLET | Freq: Two times a day (BID) | ORAL | 2 refills | Status: DC

## 2017-10-20 MED ORDER — DILTIAZEM HCL ER COATED BEADS 360 MG PO CP24: 360.0000 mg | ORAL_CAPSULE | Freq: Every day | ORAL | 1 refills | Status: DC

## 2017-10-20 MED ORDER — METFORMIN HCL 500 MG PO TABS: 500.0000 mg | ORAL_TABLET | Freq: Every day | ORAL | 1 refills | Status: DC

## 2017-10-20 NOTE — Discharge Instructions (Signed)
1)Follow-up with Dr. Kathyrn Lass  in 1 week for Recheck 2) Repeat Basic Metabolic Profile/BMP in 1 week with Dr. Kathyrn Lass at [facility] ([place] 3) Take Eliquis/Apixaban (blood thinner) for primary Stroke prevention, call if any concerns about bleeding 4)Take metoprolol and Cardizem/diltiazem for heart rate control 5) if your repeat blood work in 1 week with Dr. Sabra Heck shows persistent anemia (low blood count) you would need further workup for your anemia as outpatient with Dr. Sabra Heck with possible referral to gastroenterology and/or hematology  Information on my medicine - ELIQUIS (apixaban)  This medication education was reviewed with me or my healthcare representative as part of my discharge preparation.    Why was Eliquis prescribed for you? Eliquis was prescribed for you to reduce the risk of a blood clot forming that can cause a stroke if you have a medical condition called atrial fibrillation (a type of irregular heartbeat).  What do You need to know about Eliquis ? Take your Eliquis TWICE DAILY - one tablet in the morning and one tablet in the evening with or without food. If you have difficulty swallowing the tablet whole please discuss with your pharmacist how to take the medication safely.  Take Eliquis exactly as prescribed by your doctor and DO NOT stop taking Eliquis without talking to the doctor who prescribed the medication.  Stopping may increase your risk of developing a stroke.  Refill your prescription before you run out.  After discharge, you should have regular check-up appointments with your healthcare provider that is prescribing your Eliquis.  In the future your dose may need to be changed if your kidney function or weight changes by a significant amount or as you get older.  What do you do if you miss a dose? If you miss a dose, take it as soon as you remember on the same day and resume taking twice daily.  Do not take more than one dose of ELIQUIS at  the same time to make up a missed dose.  Important Safety Information A possible side effect of Eliquis is bleeding. You should call your healthcare provider right away if you experience any of the following: ? Bleeding from an injury or your nose that does not stop. ? Unusual colored urine (red or dark brown) or unusual colored stools (red or black). ? Unusual bruising for unknown reasons. ? A serious fall or if you hit your head (even if there is no bleeding).  Some medicines may interact with Eliquis and might increase your risk of bleeding or clotting while on Eliquis. To help avoid this, consult your healthcare provider or pharmacist prior to using any new prescription or non-prescription medications, including herbals, vitamins, non-steroidal anti-inflammatory drugs (NSAIDs) and supplements.  This website has more information on Eliquis (apixaban): http://www.eliquis.com/eliquis/home   1)Follow-up with Dr. Kathyrn Lass  in 1 week for Recheck 2) Repeat Basic Metabolic Profile/BMP in 1 week with Dr. Kathyrn Lass at [facility] ([place]) 3) Take Eliquis/Apixaban (blood thinner) for primary Stroke prevention, call if any concerns about bleeding 4)Take metoprolol and Cardizem/diltiazem for heart rate control 5) if your repeat blood work in 1 week with Dr. Sabra Heck shows persistent anemia (low blood count) you would need further workup for your anemia as outpatient with Dr. Sabra Heck with possible referral to gastroenterology and/or hematology

## 2017-10-20 NOTE — Progress Notes (Signed)
Inpatient Diabetes Program Recommendations  AACE/ADA: New Consensus Statement on Inpatient Glycemic Control (2015)  Target Ranges:  Prepandial:   less than 140 mg/dL      Peak postprandial:   less than 180 mg/dL (1-2 hours)      Critically ill patients:  140 - 180 mg/dL   Lab Results  Component Value Date   GLUCAP 191 (H) 10/19/2017    Review of Glycemic Control Results for Anna Cuevas, Anna Cuevas (MRN [id number]) as of 10/20/2017 07:31  Ref. Range 10/19/2017 07:56 10/19/2017 12:35 10/19/2017 17:31 10/19/2017 21:16  Glucose-Capillary Latest Ref Range: 65 - 99 mg/dL 228 (H) 229 (H) 247 (H) 191 (H)   Diabetes history: DM2 Outpatient Diabetes medications: Metformin 500 mg qd Current orders for Inpatient glycemic control: Novolog correction moderate tid  Inpatient Diabetes Program Recommendations:   -Lantus 17 units qd ( 0.2 units/kg x 85.1 kg) -A1c to determine preshospital glycemic control Text page sent to Dr. Denton Brick.  Thank you, Nani Gasser. Harsha Yusko, RN, MSN, CDE  Diabetes Coordinator Inpatient Glycemic Control Team Team Pager [phone] (8am-5pm) 10/20/2017 7:32 AM

## 2017-10-20 NOTE — Progress Notes (Addendum)
Physical Therapy Treatment Patient Details Name: Anna Cuevas MRN: [id number] DOB: [birth year] Today's Date: 10/20/2017    History of Present Illness Pt is a 77 y/o female admitted secondary to nausea/vomiting and progressive weakness. Upon arrival found to have a fib with RVR. CT of head, chest imaging, and abdominal imaging negative for acute abnormality. PMH includes DM and HTN.    PT Comments    Pt progressing with mobility. Able to amb short distance in room with RW and minA for balance; very easily fatigued with activity, also with BP drop (see values below). Pt and daughter hopeful for d/c home today. With hospital bed and wheelchair, both feel they will have all necessary DME. If pt remains admitted, will continue to follow acutely.  Sitting BP 113/73 Post-amb BP 88/54 Supine BP 103/64   Follow Up Recommendations  Home health PT;Supervision/Assistance - 24 hour     Equipment Recommendations  Wheelchair (measurements PT);Hospital bed    Recommendations for Other Services       Precautions / Restrictions Precautions Precautions: Fall Precaution Comments: Orthostatic BP Restrictions Weight Bearing Restrictions: No    Mobility  Bed Mobility Overal bed mobility: Needs Assistance Bed Mobility: Sit to Sidelying;Rolling Rolling: Min guard       Sit to sidelying: Mod assist General bed mobility comments: Cues for log roll as pt asking "what now? how?" when ready to lay down. ModA to assist BLEs back into bed. Max cues for scooting hips/LEs to center of bed; increased time, but no physical assist required  Transfers Overall transfer level: Needs assistance Equipment used: Rolling walker (2 wheeled) Transfers: Sit to/from Stand Sit to Stand: Min assist         General transfer comment: Increased time to scoot hips forward. MinA to stand with RW; cues for technique throughout  Ambulation/Gait Ambulation/Gait assistance: Min assist Ambulation Distance (Feet): 5  Feet Assistive device: Rolling walker (2 wheeled) Gait Pattern/deviations: Step-to pattern;Trunk flexed Gait velocity: Decreased Gait velocity interpretation: Below normal speed for age/gender General Gait Details: Backed recliner away from bed. Pt able to take short, shuffling steps with RW and minA for balance; 3x standing rest break with SOB and to correct upright posture. Cues throughout   Stairs            Wheelchair Mobility    Modified Rankin (Stroke Patients Only)       Balance Overall balance assessment: Needs assistance Sitting-balance support: Bilateral upper extremity supported;Feet supported Sitting balance-Leahy Scale: Poor Sitting balance - Comments: Reliant on UE support   Standing balance support: Bilateral upper extremity supported Standing balance-Leahy Scale: Poor                              Cognition Arousal/Alertness: Awake/alert Behavior During Therapy: WFL for tasks assessed/performed Overall Cognitive Status: Impaired/Different from baseline Area of Impairment: Problem solving                             Problem Solving: Slow processing;Decreased initiation;Difficulty sequencing;Requires verbal cues;Requires tactile cues General Comments: Required cues throughout transfers and mobility      Exercises      General Comments General comments (skin integrity, edema, etc.): Daughter present throughout session; had to encourage daughter to let pt attempt various movements vs helping with everything      Pertinent Vitals/Pain Pain Assessment: Faces Faces Pain Scale: No hurt Pain Intervention(s): Monitored during session  Home Living                      Prior Function            PT Goals (current goals can now be found in the care plan section) Acute Rehab PT Goals Patient Stated Goal: to go home  PT Goal Formulation: With patient/family Time For Goal Achievement: 10/30/17 Potential to Achieve  Goals: Good Progress towards PT goals: Progressing toward goals    Frequency    Min 3X/week      PT Plan Current plan remains appropriate    Co-evaluation              AM-PAC PT "6 Clicks" Daily Activity  Outcome Measure  Difficulty turning over in bed (including adjusting bedclothes, sheets and blankets)?: A Little Difficulty moving from lying on back to sitting on the side of the bed? : Unable Difficulty sitting down on and standing up from a chair with arms (e.g., wheelchair, bedside commode, etc,.)?: Unable Help needed moving to and from a bed to chair (including a wheelchair)?: A Little Help needed walking in hospital room?: A Lot Help needed climbing 3-5 steps with a railing? : Total 6 Click Score: 11    End of Session Equipment Utilized During Treatment: Gait belt Activity Tolerance: Patient tolerated treatment well;Patient limited by fatigue Patient left: in bed;with call bell/phone within reach;with family/visitor present Nurse Communication: Mobility status PT Visit Diagnosis: Unsteadiness on feet (R26.81);Muscle weakness (generalized) (M62.81);Other abnormalities of gait and mobility (R26.89)     Time: 8841-6606 PT Time Calculation (min) (ACUTE ONLY): 20 min  Charges:  $Therapeutic Activity: 8-22 mins                    G Codes:      Mabeline Caras, PT, DPT Acute Rehab Services  Pager: [place] 10/20/2017, 12:01 PM

## 2017-10-20 NOTE — Progress Notes (Signed)
MD made aware of patient's no void during this shift. Bladder scan says 65ml. Patient has no c/o urgency. Will continue to monitor.

## 2017-10-20 NOTE — Discharge Summary (Signed)
Anna Cuevas, is a 77 y.o. female  DOB 1941-04-28  MRN [id number].  Admission date:  10/15/2017  Admitting Physician  Elwin Mocha, MD  Discharge Date:  10/20/2017   Primary MD  [name], MD  Recommendations for primary care physician for things to follow:   1)Follow-up with Dr. Kathyrn Lass  in 1 week for Recheck 2) Repeat Basic Metabolic Profile/BMP in 1 week with Dr. Kathyrn Lass at [facility] ([place]) 3) Take Eliquis/Apixaban (blood thinner) for primary Stroke prevention, call if any concerns about bleeding 4)Take metoprolol and Cardizem/diltiazem for heart rate control 5) if your repeat blood work in 1 week with Dr. Sabra Heck shows persistent anemia (low blood count) you would need further workup for your anemia as outpatient with Dr. Sabra Heck with possible referral to gastroenterology and/or hematology  Admission Diagnosis  Diabetes mellitus without complication ([place]) [D78.2] Hyponatremia [E87.1] N&V (nausea and vomiting) [R11.2] Atrial fibrillation with RVR ([place]) [I48.91] Nausea vomiting and diarrhea [R11.2, R19.7] Atrial fibrillation with rapid ventricular response ([place]) [I48.91]  Discharge Diagnosis  Diabetes mellitus without complication ([place]) [U23.5] Hyponatremia [E87.1] N&V (nausea and vomiting) [R11.2] Atrial fibrillation with RVR ([place]) [I48.91] Nausea vomiting and diarrhea [R11.2, R19.7] Atrial fibrillation with rapid ventricular response ([place]) [I48.91]   Principal Problem:   Atrial fibrillation with rapid ventricular response ([place]) Active Problems:   Hyponatremia   N&V (nausea and vomiting)   Diabetes mellitus without complication ([place])   Pressure injury of skin     Past Medical History:  Diagnosis Date  . Diabetes mellitus without complication ([place])   . Hypertension     Past Surgical History:  Procedure Laterality Date  . CATARACT EXTRACTION, BILATERAL Bilateral         HPI  from the history and physical done on the day of admission:    HPI: Anna Cuevas is a 77 y.o. female history significant for diabetes and hypertension.  Per patient and her daughter she is been feeling off for the last 2 weeks.  Worse over the last week.  Anna Cuevas was sick with a GI bug.  Patient has had at least 2 days worth of nausea vomiting diarrhea.  Patient states that she mainly vomits when she lays on her right side and vomiting is posttussive.  Nonbloody nonbilious.  States she is compliant with all her medications.  States this morning she was weaker than other days.  She may try to keep up with fluids.  She states she felt so weak she had trouble moving her legs.  Daughter helped her to the bathroom.  Patient states she does remember a whole lot after that.  Daughter says the patient remained awake but her responses were not normal.  [facility] no brought patient to the emergency room for evaluation.  ED course: Patient found to be in A. fib with RVR.  In route she received a total of 15 mg of Lopressor and 1 L bolus of normal saline.  In the emergency room she received another 500 cc bolus.  Troponin negative.  Chest x-ray negative for abnormality.  Hospitalist consulted for admission.       Hospital Course:    Brief summary 77 year old female with past medical history relevant for diabetes, possible CKD stage III, hypertension and anemia admitted on 10/15/2017 with nausea vomiting/diarrhea and resulting dehydration and hyponatremia as well as A. fib with RVR started on IV Cardizem drip, now on po cardizem and metoprolol   Assessment & Plan:   1)Atrial Fibrillation with Rapid Ventricular response (HCC)-much improved overall, rate control is much better, discharged home on Cardizem 360 mg CD daily and metoprolol  25 mg bid, echo with EF over 65%  , magnesium and potassium are normal, TSH is 0.78. CHA2DS2-Vasc score-is = 5  Which is  equal to = 7.2% risk of stroke or  10% risk of stroke/TIA/systemic embolism, HASBLED score = 1 with risk of major bleeding of 1 in 100 patient years, patient and family members have now decided to do Eliquis, patient has anemia but no obvious bleeding, pharmacy consult for Eliquis therapy for nonvalvular A. Fib.  Risk versus benefit of anticoagulation discussed with patient and family  2)Anemia- ??? Baseline, records from [place] [place] suggested that anemia may be recent, stool occult blood is negative, iron studies are low normal, B12 and folate are not low, hemoglobin is 8.3, no obvious bleeding,  patient did receive 1 unit of packed cells.  Repeat CBC with PCP within a week.  Outpatient GI workup for anemia  3)AKI-acute kidney injury, baseline renal function as per outside records appear to have been normal previously, creatinine is down to 0.84 after hydration from 1.43,  avoid nephrotoxic agents, continue to hold lisinopril hydrochlorothiazide, repeat BMP with PCP within a week  4)DM-restart metformin,    5)HTN- c/n metoprolol and Cardizem as above #1, prior to admission patient was on lisinopril HCT and amlodipine,  may use IV Hydralazine 10 mg  Every 4 hours Prn for systolic blood pressure over 160 mmhg   6)Disposition-patient is very weak, requiring assist of 2 to transfer,, son-in-law apparently is an occupational therapist, physical therapist suggested skilled nursing facility placement but patient and family have declined skilled nursing facility placement, at this time they want to be discharged home with home health services  7) buttock and sacral decubiti-wound care consult appreciated  8)Urinary retention-in and out catheter done, resolved urinary retention,    Discharge Condition: Stable  Follow UP  [facility].   Specialty:  Home Health Services Why:  Registered Nurse and Physical therapy Contact information: [place] [address] [address] Follow up.   Why:  Hosptial Bed and wheelchair Contact information: [place] [address] [phone]           Diet and Activity recommendation:  As advised  Discharge Instructions     Discharge Instructions    Call MD for:  difficulty breathing, headache or visual disturbances   Complete by:  As directed    Call MD for:  persistant dizziness or light-headedness   Complete by:  As directed    Call MD for:  persistant nausea and vomiting   Complete by:  As directed    Call MD for:  severe uncontrolled pain   Complete by:  As directed    Call MD for:  temperature >100.4   Complete  by:  As directed    Diet - low sodium heart healthy   Complete by:  As directed    Diet Carb Modified   Complete by:  As directed    Discharge instructions   Complete by:  As directed    1)Follow-up with Dr. Kathyrn Lass  in 1 week for Recheck 2) Repeat Basic Metabolic Profile/BMP in 1 week with Dr. Kathyrn Lass at [facility] ([place]) 3) Take Eliquis/Apixaban (blood thinner) for primary Stroke prevention, call if any concerns about bleeding 4)Take metoprolol and Cardizem/diltiazem for heart rate control 5) if your repeat blood work in 1 week with Dr. Sabra Heck shows persistent anemia (low blood count) you would need further workup for your anemia as outpatient with Dr. Sabra Heck with possible referral to gastroenterology and/or hematology   Walk with assistance   Complete by:  As directed         Discharge Medications     Allergies as of 10/20/2017   Not on File     Medication List    STOP taking these medications   amLODipine 10 MG tablet Commonly known as:  NORVASC   aspirin EC 81 MG tablet   lisinopril-hydrochlorothiazide 20-12.5 MG tablet Commonly known as:  PRINZIDE,ZESTORETIC     TAKE these medications   acetaminophen 325 MG tablet Commonly known as:  TYLENOL Take 2 tablets  (650 mg total) by mouth every 6 (six) hours as needed for mild pain (or Fever >/= 101).   apixaban 5 MG Tabs tablet Commonly known as:  ELIQUIS Take 1 tablet (5 mg total) by mouth 2 (two) times daily.   BISACODYL LAXATIVE PO Take 1 tablet by mouth as needed (constipation).   diltiazem 360 MG 24 hr capsule Commonly known as:  CARDIZEM CD Take 1 capsule (360 mg total) by mouth daily.   docusate sodium 100 MG capsule Commonly known as:  COLACE Take 200 mg by mouth as needed for mild constipation.   ICY HOT 10-30 % Stck Apply 1 application topically as needed (Back and Neck pain).   metFORMIN 500 MG tablet Commonly known as:  GLUCOPHAGE Take 1 tablet (500 mg total) by mouth daily.   metoprolol tartrate 25 MG tablet Commonly known as:  LOPRESSOR Take 1 tablet (25 mg total) by mouth 2 (two) times daily.   ondansetron 4 MG tablet Commonly known as:  ZOFRAN Take 1 tablet (4 mg total) by mouth every 6 (six) hours as needed for nausea.   pantoprazole 40 MG tablet Commonly known as:  PROTONIX Take 1 tablet (40 mg total) by mouth daily. Start taking on:  10/21/2017   polyethylene glycol packet Commonly known as:  MIRALAX / GLYCOLAX Take 17 g by mouth daily as needed for moderate constipation.   sertraline 100 MG tablet Commonly known as:  ZOLOFT Take 200 mg by mouth daily.   sodium chloride 0.65 % Soln nasal spray Commonly known as:  OCEAN Place 2 sprays into both nostrils 3 (three) times daily.   tamsulosin 0.4 MG Caps capsule Commonly known as:  FLOMAX Take 1 capsule (0.4 mg total) by mouth daily after supper. For Bladder            Durable Medical Equipment  (From admission, onward)        Start     Ordered   10/19/17 1405  For home use only DME lightweight manual wheelchair with seat cushion  Once    Comments:  Patient suffers from dyspnea  which impairs their  ability to perform daily activities like bathing and toileting in the home.  A cane or walker will not  resolve  issue with performing activities of daily living. A wheelchair will allow patient to safely perform daily activities. Patient is not able to propel themselves in the home using a standard weight wheelchair due to endurance and general weakness. Patient can self propel in the lightweight wheelchair.  Accessories: elevating leg rests (ELRs), wheel locks, extensions and anti-tippers.   10/19/17 1405   10/19/17 1404  For home use only DME Hospital bed  Once    Question Answer Comment  Patient has (list medical condition): dyspnea   The above medical condition requires: Patient requires the ability to reposition frequently   Head must be elevated greater than: 30 degrees   Bed type Semi-electric      10/19/17 1405      Major procedures and Radiology Reports - PLEASE review detailed and final reports for all details, in brief -    Ct Head Wo Contrast  Result Date: 10/15/2017 CLINICAL DATA:  Altered mental status EXAM: CT HEAD WITHOUT CONTRAST TECHNIQUE: Contiguous axial images were obtained from the base of the skull through the vertex without intravenous contrast. COMPARISON:  None. FINDINGS: Brain: No mass lesion, intraparenchymal hemorrhage or extra-axial collection. No evidence of acute cortical infarct. Normal appearance of the brain parenchyma and extra axial spaces for age. Vascular: No hyperdense vessel or unexpected vascular calcification. Skull: Normal visualized skull base, calvarium and extracranial soft tissues. Sinuses/Orbits: No sinus fluid levels or advanced mucosal thickening. No mastoid effusion. Normal orbits. IMPRESSION: Normal brain. Electronically Signed   By: Ulyses Jarred M.D.   On: 10/15/2017 16:55   Dg Chest Port 1 View  Result Date: 10/15/2017 CLINICAL DATA:  Weakness and atrial fibrillation EXAM: PORTABLE CHEST 1 VIEW COMPARISON:  07/08/2007 FINDINGS: Cardiac shadow is within normal limits. The lungs are well aerated bilaterally. Mild mid lung interstitial  changes are noted similar to that seen on prior CT examination. No focal infiltrate or sizable effusion is noted. No bony abnormality is noted. IMPRESSION: No acute abnormality seen. Electronically Signed   By: Inez Catalina M.D.   On: 10/15/2017 10:50   Dg Abd Portable 1v  Result Date: 10/15/2017 CLINICAL DATA:  Nausea vomiting.  No abdominal pain. EXAM: PORTABLE ABDOMEN - 1 VIEW COMPARISON:  None. FINDINGS: The bowel gas pattern is normal. 3 mm calcification projecting over the right renal shadow may represent a small calculus. 4.7 cm calcified uterine fibroid in the pelvis. No acute osseous abnormality. Degenerative changes of the lumbar spine. IMPRESSION: 1. No evidence of bowel obstruction. 2. Possible 3 mm right renal calculus. Electronically Signed   By: Titus Dubin M.D.   On: 10/15/2017 14:25    Micro Results    Recent Results (from the past 240 hour(s))  Culture, blood (routine x 2)     Status: None   Collection Time: 10/15/17 11:10 AM  Result Value Ref Range Status   Specimen Description BLOOD RIGHT ANTECUBITAL  Final   Special Requests   Final    BOTTLES DRAWN AEROBIC AND ANAEROBIC Blood Culture adequate volume   Culture   Final    NO GROWTH 5 DAYS Performed at [facility], [address] [place], [place] [address]    Report Status 10/20/2017 FINAL  Final  Culture, blood (routine x 2)     Status: None   Collection Time: 10/15/17 11:25 AM  Result Value Ref Range Status  Specimen Description BLOOD RIGHT HAND  Final   Special Requests   Final    BOTTLES DRAWN AEROBIC AND ANAEROBIC Blood Culture adequate volume   Culture   Final    NO GROWTH 5 DAYS Performed at [facility], [address] [place], [place] [address]    Report Status 10/20/2017 FINAL  Final  Urine culture     Status: None   Collection Time: 10/15/17  2:51 PM  Result Value Ref Range Status   Specimen Description URINE, CATHETERIZED  Final   Special Requests   Final    NONE Performed at  [facility], [place] [address], [place], [place] [address]    Culture NO GROWTH  Final   Report Status 10/16/2017 FINAL  Final  MRSA PCR Screening     Status: None   Collection Time: 10/16/17  2:53 PM  Result Value Ref Range Status   MRSA by PCR NEGATIVE NEGATIVE Final    Comment:        The GeneXpert MRSA Assay (FDA approved for NASAL specimens only), is one component of a comprehensive MRSA colonization surveillance program. It is not intended to diagnose MRSA infection nor to guide or monitor treatment for MRSA infections. Performed at [facility], [place] [address], [place], [place] [address]        Today   Subjective    Anna Cuevas today has no no new complaints,, she is eager to go home          Patient has been seen and examined prior to discharge   Objective   Blood pressure 123/79, pulse 97, temperature 98 F (36.7 C), temperature source Oral, resp. rate (!) 22, height 5\' 3"  (1.6 m), weight 85.1 kg (187 lb 9.8 oz), SpO2 96 %.   Intake/Output Summary (Last 24 hours) at 10/20/2017 1647 Last data filed at 10/20/2017 1509 Gross per 24 hour  Intake 1454 ml  Output 1100 ml  Net 354 ml    Exam Gen:- Awake Alert,  In no apparent distress  HEENT:- [place].AT, No sclera icterus Neck-Supple Neck,No JVD,.  Lungs-  CTAB , good air movement CV- S1, S2 normal , irregularly irregular Abd-  +ve B.Sounds, Abd Soft, No tenderness,    Extremity/Skin:- No  edema,   good pulses Psych-affect is appropriate, oriented x3 Neuro-no new focal deficits, no tremors   Data Review   CBC w Diff:  Lab Results  Component Value Date   WBC 10.3 10/20/2017   HGB 8.3 (L) 10/20/2017   HCT 25.5 (L) 10/20/2017   PLT 366 10/20/2017   LYMPHOPCT 7 10/15/2017   MONOPCT 4 10/15/2017   EOSPCT 0 10/15/2017   BASOPCT 0 10/15/2017    CMP:  Lab Results  Component Value Date   NA 128 (L) 10/20/2017   K 4.4 10/20/2017   CL 91 (L) 10/20/2017   CO2 25 10/20/2017   BUN 33 (H)  10/20/2017   CREATININE 0.84 10/20/2017   PROT 6.1 (L) 10/15/2017   ALBUMIN 2.9 (L) 10/15/2017   BILITOT 0.5 10/15/2017   ALKPHOS 133 (H) 10/15/2017   AST 32 10/15/2017   ALT 38 10/15/2017  .   Total Discharge time is about 33 minutes  Roxan Hockey M.D on 10/20/2017 at 4:47 PM  [facility]   Office  [phone]  Voice Recognition Viviann Spare dictation system was used to create this note, attempts have been made to correct errors. Please contact the author with questions and/or clarifications.

## 2017-10-20 NOTE — Progress Notes (Signed)
    Durable Medical Equipment  (From admission, onward)        Start     Ordered   10/19/17 1405  For home use only DME lightweight manual wheelchair with seat cushion  Once    Comments:  Patient suffers from dyspnea  which impairs their ability to perform daily activities like bathing and toileting in the home.  A cane or walker will not resolve  issue with performing activities of daily living. A wheelchair will allow patient to safely perform daily activities. Patient is not able to propel themselves in the home using a standard weight wheelchair due to endurance and general weakness. Patient can self propel in the lightweight wheelchair.  Accessories: elevating leg rests (ELRs), wheel locks, extensions and anti-tippers.   10/19/17 1405   10/19/17 1404  For home use only DME Hospital bed  Once    Question Answer Comment  Patient has (list medical condition): dyspnea   The above medical condition requires: Patient requires the ability to reposition frequently   Head must be elevated greater than: 30 degrees   Bed type Semi-electric      10/19/17 [place], PT, DPT Acute Rehab Services  Pager: [phone]

## 2017-10-21 NOTE — Consult Note (Signed)
            [facility] [address] CM Primary Care Navigator  10/21/2017  Anna Cuevas [birth year] [id number]  Attempt to seepatient at the bedside to identify possible discharge needsbutshe was already dischargedhome. Patient went home yesterday with home health services ([facility]).  Per MD note, patient was admitted with nausea, vomiting, diarrhea with resulting dehydration and hyponatremia as well as atrial fibrillation with rapid ventricular response.   Primary care provider's office(Dr. Kathyrn Lass at [facility]) is listed as providingtransition of care (TOC)follow-up.   Patient has discharge instruction to follow-up with primary care provider in 1 week.   For additional questions please contact:  Edwena Felty A. Glorie Dowlen, BSN, RN-BC [facility] PRIMARY CARE Navigator Cell: [phone]

## 2017-10-23 DIAGNOSIS — Z993 Dependence on wheelchair: Secondary | ICD-10-CM | POA: Diagnosis not present

## 2017-10-23 DIAGNOSIS — E119 Type 2 diabetes mellitus without complications: Secondary | ICD-10-CM | POA: Diagnosis not present

## 2017-10-23 DIAGNOSIS — D649 Anemia, unspecified: Secondary | ICD-10-CM | POA: Diagnosis not present

## 2017-10-23 DIAGNOSIS — I480 Paroxysmal atrial fibrillation: Secondary | ICD-10-CM | POA: Diagnosis not present

## 2017-10-23 DIAGNOSIS — Z7984 Long term (current) use of oral hypoglycemic drugs: Secondary | ICD-10-CM | POA: Diagnosis not present

## 2017-10-23 DIAGNOSIS — F329 Major depressive disorder, single episode, unspecified: Secondary | ICD-10-CM | POA: Diagnosis not present

## 2017-10-23 DIAGNOSIS — Z9181 History of falling: Secondary | ICD-10-CM | POA: Diagnosis not present

## 2017-10-23 DIAGNOSIS — I10 Essential (primary) hypertension: Secondary | ICD-10-CM | POA: Diagnosis not present

## 2017-10-23 DIAGNOSIS — L89312 Pressure ulcer of right buttock, stage 2: Secondary | ICD-10-CM | POA: Diagnosis not present

## 2017-10-23 DIAGNOSIS — Z7901 Long term (current) use of anticoagulants: Secondary | ICD-10-CM | POA: Diagnosis not present

## 2017-10-30 DIAGNOSIS — L89302 Pressure ulcer of unspecified buttock, stage 2: Secondary | ICD-10-CM | POA: Diagnosis not present

## 2017-11-12 DIAGNOSIS — M533 Sacrococcygeal disorders, not elsewhere classified: Secondary | ICD-10-CM | POA: Diagnosis not present

## 2017-11-13 DIAGNOSIS — R600 Localized edema: Secondary | ICD-10-CM | POA: Diagnosis not present

## 2017-11-13 DIAGNOSIS — E782 Mixed hyperlipidemia: Secondary | ICD-10-CM | POA: Diagnosis not present

## 2017-11-13 DIAGNOSIS — D649 Anemia, unspecified: Secondary | ICD-10-CM | POA: Diagnosis not present

## 2017-11-13 DIAGNOSIS — R5381 Other malaise: Secondary | ICD-10-CM | POA: Diagnosis not present

## 2017-11-13 DIAGNOSIS — I10 Essential (primary) hypertension: Secondary | ICD-10-CM | POA: Diagnosis not present

## 2017-11-13 DIAGNOSIS — E119 Type 2 diabetes mellitus without complications: Secondary | ICD-10-CM | POA: Diagnosis not present

## 2017-11-13 DIAGNOSIS — E860 Dehydration: Secondary | ICD-10-CM | POA: Diagnosis not present

## 2017-11-13 DIAGNOSIS — K219 Gastro-esophageal reflux disease without esophagitis: Secondary | ICD-10-CM | POA: Diagnosis not present

## 2017-11-13 DIAGNOSIS — F3342 Major depressive disorder, recurrent, in full remission: Secondary | ICD-10-CM | POA: Diagnosis not present

## 2017-11-13 DIAGNOSIS — I480 Paroxysmal atrial fibrillation: Secondary | ICD-10-CM | POA: Diagnosis not present

## 2017-11-15 DIAGNOSIS — J209 Acute bronchitis, unspecified: Secondary | ICD-10-CM | POA: Diagnosis not present

## 2017-11-19 DIAGNOSIS — L89322 Pressure ulcer of left buttock, stage 2: Secondary | ICD-10-CM | POA: Diagnosis not present

## 2017-11-19 DIAGNOSIS — L8990 Pressure ulcer of unspecified site, unspecified stage: Secondary | ICD-10-CM | POA: Diagnosis not present

## 2017-11-20 DIAGNOSIS — R112 Nausea with vomiting, unspecified: Secondary | ICD-10-CM | POA: Diagnosis not present

## 2017-11-20 DIAGNOSIS — D649 Anemia, unspecified: Secondary | ICD-10-CM | POA: Diagnosis not present

## 2017-11-20 DIAGNOSIS — K5900 Constipation, unspecified: Secondary | ICD-10-CM | POA: Diagnosis not present

## 2017-11-20 DIAGNOSIS — D539 Nutritional anemia, unspecified: Secondary | ICD-10-CM | POA: Diagnosis not present

## 2017-11-21 ENCOUNTER — Other Ambulatory Visit: Payer: Self-pay

## 2017-11-21 ENCOUNTER — Emergency Department (HOSPITAL_COMMUNITY): Payer: PPO

## 2017-11-21 ENCOUNTER — Inpatient Hospital Stay (HOSPITAL_COMMUNITY)
Admission: EM | Admit: 2017-11-21 | Discharge: 2017-12-02 | DRG: 436 | Disposition: A | Discharge status: Home / Self Care | Payer: PPO | Attending: Internal Medicine | Admitting: Internal Medicine

## 2017-11-21 ENCOUNTER — Encounter (HOSPITAL_COMMUNITY): Payer: Self-pay

## 2017-11-21 DIAGNOSIS — E871 Hypo-osmolality and hyponatremia: Secondary | ICD-10-CM | POA: Diagnosis not present

## 2017-11-21 DIAGNOSIS — K7689 Other specified diseases of liver: Secondary | ICD-10-CM | POA: Diagnosis not present

## 2017-11-21 DIAGNOSIS — K219 Gastro-esophageal reflux disease without esophagitis: Secondary | ICD-10-CM | POA: Diagnosis present

## 2017-11-21 DIAGNOSIS — I10 Essential (primary) hypertension: Secondary | ICD-10-CM | POA: Diagnosis not present

## 2017-11-21 DIAGNOSIS — Z6831 Body mass index (BMI) 31.0-31.9, adult: Secondary | ICD-10-CM | POA: Diagnosis not present

## 2017-11-21 DIAGNOSIS — K921 Melena: Secondary | ICD-10-CM | POA: Diagnosis not present

## 2017-11-21 DIAGNOSIS — N179 Acute kidney failure, unspecified: Secondary | ICD-10-CM | POA: Diagnosis not present

## 2017-11-21 DIAGNOSIS — C23 Malignant neoplasm of gallbladder: Principal | ICD-10-CM | POA: Diagnosis present

## 2017-11-21 DIAGNOSIS — I4891 Unspecified atrial fibrillation: Secondary | ICD-10-CM | POA: Diagnosis present

## 2017-11-21 DIAGNOSIS — E119 Type 2 diabetes mellitus without complications: Secondary | ICD-10-CM | POA: Diagnosis not present

## 2017-11-21 DIAGNOSIS — Z978 Presence of other specified devices: Secondary | ICD-10-CM | POA: Diagnosis not present

## 2017-11-21 DIAGNOSIS — K315 Obstruction of duodenum: Secondary | ICD-10-CM | POA: Diagnosis not present

## 2017-11-21 DIAGNOSIS — K828 Other specified diseases of gallbladder: Secondary | ICD-10-CM | POA: Diagnosis not present

## 2017-11-21 DIAGNOSIS — Z9841 Cataract extraction status, right eye: Secondary | ICD-10-CM | POA: Diagnosis not present

## 2017-11-21 DIAGNOSIS — L89302 Pressure ulcer of unspecified buttock, stage 2: Secondary | ICD-10-CM | POA: Diagnosis not present

## 2017-11-21 DIAGNOSIS — K8050 Calculus of bile duct without cholangitis or cholecystitis without obstruction: Secondary | ICD-10-CM | POA: Diagnosis not present

## 2017-11-21 DIAGNOSIS — F329 Major depressive disorder, single episode, unspecified: Secondary | ICD-10-CM | POA: Diagnosis not present

## 2017-11-21 DIAGNOSIS — C784 Secondary malignant neoplasm of small intestine: Secondary | ICD-10-CM | POA: Diagnosis not present

## 2017-11-21 DIAGNOSIS — Z4682 Encounter for fitting and adjustment of non-vascular catheter: Secondary | ICD-10-CM | POA: Diagnosis not present

## 2017-11-21 DIAGNOSIS — C170 Malignant neoplasm of duodenum: Secondary | ICD-10-CM | POA: Diagnosis not present

## 2017-11-21 DIAGNOSIS — D62 Acute posthemorrhagic anemia: Secondary | ICD-10-CM | POA: Diagnosis present

## 2017-11-21 DIAGNOSIS — Z789 Other specified health status: Secondary | ICD-10-CM

## 2017-11-21 DIAGNOSIS — E876 Hypokalemia: Secondary | ICD-10-CM | POA: Diagnosis not present

## 2017-11-21 DIAGNOSIS — L8990 Pressure ulcer of unspecified site, unspecified stage: Secondary | ICD-10-CM | POA: Diagnosis present

## 2017-11-21 DIAGNOSIS — Z452 Encounter for adjustment and management of vascular access device: Secondary | ICD-10-CM | POA: Diagnosis not present

## 2017-11-21 DIAGNOSIS — E669 Obesity, unspecified: Secondary | ICD-10-CM | POA: Diagnosis not present

## 2017-11-21 DIAGNOSIS — C229 Malignant neoplasm of liver, not specified as primary or secondary: Secondary | ICD-10-CM | POA: Diagnosis not present

## 2017-11-21 DIAGNOSIS — D649 Anemia, unspecified: Secondary | ICD-10-CM | POA: Diagnosis not present

## 2017-11-21 DIAGNOSIS — C787 Secondary malignant neoplasm of liver and intrahepatic bile duct: Secondary | ICD-10-CM | POA: Diagnosis present

## 2017-11-21 DIAGNOSIS — Z66 Do not resuscitate: Secondary | ICD-10-CM | POA: Diagnosis not present

## 2017-11-21 DIAGNOSIS — K920 Hematemesis: Secondary | ICD-10-CM | POA: Diagnosis present

## 2017-11-21 DIAGNOSIS — Z7189 Other specified counseling: Secondary | ICD-10-CM

## 2017-11-21 DIAGNOSIS — K8070 Calculus of gallbladder and bile duct without cholecystitis without obstruction: Secondary | ICD-10-CM | POA: Diagnosis not present

## 2017-11-21 DIAGNOSIS — K311 Adult hypertrophic pyloric stenosis: Secondary | ICD-10-CM | POA: Diagnosis not present

## 2017-11-21 DIAGNOSIS — L8992 Pressure ulcer of unspecified site, stage 2: Secondary | ICD-10-CM | POA: Diagnosis not present

## 2017-11-21 DIAGNOSIS — Z9842 Cataract extraction status, left eye: Secondary | ICD-10-CM

## 2017-11-21 DIAGNOSIS — J90 Pleural effusion, not elsewhere classified: Secondary | ICD-10-CM | POA: Diagnosis not present

## 2017-11-21 DIAGNOSIS — Z515 Encounter for palliative care: Secondary | ICD-10-CM

## 2017-11-21 DIAGNOSIS — R112 Nausea with vomiting, unspecified: Secondary | ICD-10-CM | POA: Diagnosis present

## 2017-11-21 DIAGNOSIS — Z7901 Long term (current) use of anticoagulants: Secondary | ICD-10-CM

## 2017-11-21 DIAGNOSIS — R160 Hepatomegaly, not elsewhere classified: Secondary | ICD-10-CM

## 2017-11-21 DIAGNOSIS — D500 Iron deficiency anemia secondary to blood loss (chronic): Secondary | ICD-10-CM | POA: Diagnosis present

## 2017-11-21 DIAGNOSIS — K922 Gastrointestinal hemorrhage, unspecified: Secondary | ICD-10-CM | POA: Diagnosis present

## 2017-11-21 DIAGNOSIS — R933 Abnormal findings on diagnostic imaging of other parts of digestive tract: Secondary | ICD-10-CM | POA: Diagnosis not present

## 2017-11-21 DIAGNOSIS — F32A Depression, unspecified: Secondary | ICD-10-CM | POA: Diagnosis present

## 2017-11-21 DIAGNOSIS — R000 Tachycardia, unspecified: Secondary | ICD-10-CM | POA: Diagnosis not present

## 2017-11-21 LAB — COMPREHENSIVE METABOLIC PANEL
ALBUMIN: 3.3 g/dL — AB (ref 3.5–5.0)
ALK PHOS: 132 U/L — AB (ref 38–126)
ALT: 22 U/L (ref 14–54)
AST: 20 U/L (ref 15–41)
Anion gap: 14 (ref 5–15)
BUN: 32 mg/dL — AB (ref 6–20)
CALCIUM: 8.6 mg/dL — AB (ref 8.9–10.3)
CHLORIDE: 94 mmol/L — AB (ref 101–111)
CO2: 24 mmol/L (ref 22–32)
CREATININE: 1.17 mg/dL — AB (ref 0.44–1.00)
GFR calc Af Amer: 51 mL/min — ABNORMAL LOW (ref >60)
GFR calc non Af Amer: 44 mL/min — ABNORMAL LOW (ref >60)
GLUCOSE: 240 mg/dL — AB (ref 65–99)
Potassium: 3.8 mmol/L (ref 3.5–5.1)
SODIUM: 132 mmol/L — AB (ref 135–145)
Total Bilirubin: 0.7 mg/dL (ref 0.3–1.2)
Total Protein: 6.1 g/dL — ABNORMAL LOW (ref 6.5–8.1)

## 2017-11-21 LAB — CBC WITH DIFFERENTIAL/PLATELET
BASOS ABS: 0 10*3/uL (ref 0.0–0.1)
Basophils Relative: 0 %
EOS ABS: 0 10*3/uL (ref 0.0–0.7)
EOS PCT: 0 %
HCT: 23.9 % — ABNORMAL LOW (ref 36.0–46.0)
HEMOGLOBIN: 7.5 g/dL — AB (ref 12.0–15.0)
LYMPHS PCT: 7 %
Lymphs Abs: 0.8 10*3/uL (ref 0.7–4.0)
MCH: 25.2 pg — ABNORMAL LOW (ref 26.0–34.0)
MCHC: 31.4 g/dL (ref 30.0–36.0)
MCV: 80.2 fL (ref 78.0–100.0)
Monocytes Absolute: 0.6 10*3/uL (ref 0.1–1.0)
Monocytes Relative: 5 %
NEUTROS PCT: 88 %
Neutro Abs: 10.6 10*3/uL — ABNORMAL HIGH (ref 1.7–7.7)
PLATELETS: 467 10*3/uL — AB (ref 150–400)
RBC: 2.98 MIL/uL — AB (ref 3.87–5.11)
RDW: 15.4 % (ref 11.5–15.5)
WBC: 12 10*3/uL — AB (ref 4.0–10.5)

## 2017-11-21 LAB — I-STAT CHEM 8, ED
BUN: 26 mg/dL — ABNORMAL HIGH (ref 6–20)
BUN: 30 mg/dL — AB (ref 6–20)
CHLORIDE: 94 mmol/L — AB (ref 101–111)
CREATININE: 0.9 mg/dL (ref 0.44–1.00)
Calcium, Ion: 0.85 mmol/L — CL (ref 1.15–1.40)
Calcium, Ion: 1.13 mmol/L — ABNORMAL LOW (ref 1.15–1.40)
Chloride: 105 mmol/L (ref 101–111)
Creatinine, Ser: 0.7 mg/dL (ref 0.44–1.00)
Glucose, Bld: 195 mg/dL — ABNORMAL HIGH (ref 65–99)
Glucose, Bld: 227 mg/dL — ABNORMAL HIGH (ref 65–99)
HCT: 20 % — ABNORMAL LOW (ref 36.0–46.0)
HCT: 23 % — ABNORMAL LOW (ref 36.0–46.0)
Hemoglobin: 6.8 g/dL — CL (ref 12.0–15.0)
Hemoglobin: 7.8 g/dL — ABNORMAL LOW (ref 12.0–15.0)
POTASSIUM: 4.1 mmol/L (ref 3.5–5.1)
Potassium: 2.9 mmol/L — ABNORMAL LOW (ref 3.5–5.1)
Sodium: 135 mmol/L (ref 135–145)
Sodium: 132 mmol/L — ABNORMAL LOW (ref 135–145)
TCO2: 18 mmol/L — ABNORMAL LOW (ref 22–32)
TCO2: 23 mmol/L (ref 22–32)

## 2017-11-21 LAB — PROTIME-INR
INR: 1.33
Prothrombin Time: 16.3 s — ABNORMAL HIGH (ref 11.4–15.2)

## 2017-11-21 LAB — OCCULT BLOOD GASTRIC / DUODENUM (SPECIMEN CUP): Occult Blood, Gastric: POSITIVE — AB

## 2017-11-21 LAB — I-STAT TROPONIN, ED: Troponin i, poc: 0 ng/mL (ref 0.00–0.08)

## 2017-11-21 LAB — I-STAT CG4 LACTIC ACID, ED: Lactic Acid, Venous: 1.15 mmol/L (ref 0.5–1.9)

## 2017-11-21 LAB — LIPASE, BLOOD: Lipase: 52 U/L — ABNORMAL HIGH (ref 11–51)

## 2017-11-21 LAB — PREPARE RBC (CROSSMATCH)

## 2017-11-21 MED ORDER — ONDANSETRON HCL 4 MG/2ML IJ SOLN
4.0000 mg | Freq: Four times a day (QID) | INTRAMUSCULAR | Status: DC | PRN
  Administered 2017-11-27 – 2017-11-29 (×3): 4 mg via INTRAVENOUS
  Filled 2017-11-21 (×3): qty 2

## 2017-11-21 MED ORDER — SODIUM CHLORIDE 0.9 % IV BOLUS
1000.0000 mL | Freq: Once | INTRAVENOUS | Status: AC
Start: 2017-11-21 — End: 2017-11-21
  Administered 2017-11-21: 1000 mL via INTRAVENOUS

## 2017-11-21 MED ORDER — ONDANSETRON HCL 4 MG/2ML IJ SOLN
4.0000 mg | Freq: Once | INTRAMUSCULAR | Status: AC
  Administered 2017-11-22: 4 mg via INTRAVENOUS
  Filled 2017-11-21: qty 2

## 2017-11-21 MED ORDER — IOPAMIDOL (ISOVUE-300) INJECTION 61%
80.0000 mL | Freq: Once | INTRAVENOUS | Status: AC | PRN
  Administered 2017-11-21: 80 mL via INTRAVENOUS

## 2017-11-21 MED ORDER — ONDANSETRON HCL 4 MG PO TABS: 4.0000 mg | ORAL_TABLET | Freq: Four times a day (QID) | ORAL | Status: DC | PRN

## 2017-11-21 MED ORDER — HYDRALAZINE HCL 20 MG/ML IJ SOLN
5.0000 mg | INTRAMUSCULAR | Status: DC | PRN
  Administered 2017-11-29: 5 mg via INTRAVENOUS
  Filled 2017-11-21: qty 1

## 2017-11-21 MED ORDER — VITAMIN K1 10 MG/ML IJ SOLN
10.0000 mg | Freq: Once | INTRAVENOUS | Status: AC
  Administered 2017-11-21: 10 mg via INTRAVENOUS
  Filled 2017-11-21: qty 1

## 2017-11-21 MED ORDER — ACETAMINOPHEN 325 MG PO TABS
650.0000 mg | ORAL_TABLET | Freq: Four times a day (QID) | ORAL | Status: DC | PRN
Start: 2017-11-21 — End: 2017-12-02
  Administered 2017-11-28 – 2017-11-30 (×2): 650 mg via ORAL
  Filled 2017-11-21 (×3): qty 2

## 2017-11-21 MED ORDER — INSULIN ASPART 100 UNIT/ML ~~LOC~~ SOLN
0.0000 [IU] | Freq: Every day | SUBCUTANEOUS | Status: DC
  Administered 2017-11-22: 2 [IU] via SUBCUTANEOUS
  Filled 2017-11-21: qty 1

## 2017-11-21 MED ORDER — INSULIN ASPART 100 UNIT/ML ~~LOC~~ SOLN
0.0000 [IU] | Freq: Three times a day (TID) | SUBCUTANEOUS | Status: DC
  Administered 2017-11-22: 1 [IU] via SUBCUTANEOUS
  Administered 2017-11-22: 2 [IU] via SUBCUTANEOUS
  Administered 2017-11-23: 1 [IU] via SUBCUTANEOUS
  Filled 2017-11-21: qty 1

## 2017-11-21 MED ORDER — ONDANSETRON HCL 4 MG/2ML IJ SOLN
4.0000 mg | Freq: Once | INTRAMUSCULAR | Status: AC
  Administered 2017-11-21: 4 mg via INTRAVENOUS

## 2017-11-21 MED ORDER — SODIUM CHLORIDE 0.9 % IV SOLN
8.0000 mg/h | INTRAVENOUS | Status: AC
  Administered 2017-11-21 – 2017-11-24 (×6): 8 mg/h via INTRAVENOUS
  Filled 2017-11-21 (×11): qty 80

## 2017-11-21 MED ORDER — PROTHROMBIN COMPLEX CONC HUMAN 500 UNITS IV KIT
1600.0000 [IU] | PACK | Freq: Once | Status: AC
  Administered 2017-11-21: 1600 [IU] via INTRAVENOUS
  Filled 2017-11-21: qty 1600

## 2017-11-21 MED ORDER — ONDANSETRON HCL 4 MG/2ML IJ SOLN
4.0000 mg | Freq: Once | INTRAMUSCULAR | Status: DC
  Filled 2017-11-21: qty 2

## 2017-11-21 MED ORDER — METOPROLOL TARTRATE 5 MG/5ML IV SOLN
5.0000 mg | Freq: Three times a day (TID) | INTRAVENOUS | Status: DC
  Administered 2017-11-22 – 2017-11-23 (×5): 5 mg via INTRAVENOUS
  Filled 2017-11-21 (×5): qty 5

## 2017-11-21 MED ORDER — SODIUM CHLORIDE 0.9 % IV SOLN
INTRAVENOUS | Status: AC
  Administered 2017-11-21 – 2017-11-25 (×6): via INTRAVENOUS

## 2017-11-21 MED ORDER — SODIUM CHLORIDE 0.9 % IV SOLN
80.0000 mg | Freq: Once | INTRAVENOUS | Status: AC
  Administered 2017-11-21: 80 mg via INTRAVENOUS
  Filled 2017-11-21 (×2): qty 80

## 2017-11-21 MED ORDER — SODIUM CHLORIDE 0.9 % IV SOLN
Freq: Once | INTRAVENOUS | Status: AC
  Administered 2017-11-21: 22:00:00 via INTRAVENOUS

## 2017-11-21 MED ORDER — ACETAMINOPHEN 650 MG RE SUPP
650.0000 mg | Freq: Four times a day (QID) | RECTAL | Status: DC | PRN
  Administered 2017-11-29: 650 mg via RECTAL
  Filled 2017-11-21: qty 1

## 2017-11-21 NOTE — ED Provider Notes (Signed)
[facility]ERG[facility]Y DE[facility]MENT Provider Note   CSN: [id number] Arrival date & time: 11/21/17  1652     History   Chief Complaint Chief Complaint  Patient presents with  . Emesis    HPI Anna Cuevas is a 77 y.o. female to the emergency department from urgent care for hematemesis.  The patient was discharged from the hospital after admission in early April for new onset A. fib.  She was found to be anemic however no source was identified at this time and she had a negative FOBT.  Patient was given 1 unit for blood transfusion.  Patient has had 3 days of active and severe vomiting.  She noticed darkness in her vomitus yesterday and over the past 24 hours has been having coffee-ground emesis.  She feels weak but denies shortness of breath.  HPI  Past Medical History:  Diagnosis Date  . Diabetes mellitus without complication ([place])   . Hypertension     Patient Active Problem List   Diagnosis Date Noted  . Pressure injury of skin 10/17/2017  . Atrial fibrillation with rapid ventricular response ([place]) 10/15/2017  . Hyponatremia 10/15/2017  . N&V (nausea and vomiting) 10/15/2017  . Diabetes mellitus without complication [facility])     Past Surgical History:  Procedure Laterality Date  . CATARACT EXTRACTION, BILATERAL Bilateral      OB History   None      Home Medications    Prior to Admission medications   Medication Sig Start Date End Date Taking? Authorizing Provider  acetaminophen (TYLENOL) 325 MG tablet Take 2 tablets (650 mg total) by mouth every 6 (six) hours as needed for mild pain (or Fever >/= 101). 10/20/17   Emokpae, Courage, MD  apixaban (ELIQUIS) 5 MG TABS tablet Take 1 tablet (5 mg total) by mouth 2 (two) times daily. 10/20/17   Roxan Hockey, MD  BISACODYL LAXATIVE PO Take 1 tablet by mouth as needed (constipation).    [provider]  diltiazem (CARDIZEM CD) 360 MG 24 hr capsule Take 1 capsule (360 mg total) by mouth daily. 10/20/17  10/20/18  Roxan Hockey, MD  docusate sodium (COLACE) 100 MG capsule Take 200 mg by mouth as needed for mild constipation.    [provider]  Menthol-Methyl Salicylate (ICY HOT) 85-27 % STCK Apply 1 application topically as needed (Back and Neck pain).    [provider]  metFORMIN (GLUCOPHAGE) 500 MG tablet Take 1 tablet (500 mg total) by mouth daily. 10/20/17   Roxan Hockey, MD  metoprolol tartrate (LOPRESSOR) 25 MG tablet Take 1 tablet (25 mg total) by mouth 2 (two) times daily. 10/20/17   Roxan Hockey, MD  ondansetron (ZOFRAN) 4 MG tablet Take 1 tablet (4 mg total) by mouth every 6 (six) hours as needed for nausea. 10/20/17   Roxan Hockey, MD  pantoprazole (PROTONIX) 40 MG tablet Take 1 tablet (40 mg total) by mouth daily. 10/21/17   Roxan Hockey, MD  polyethylene glycol (MIRALAX / GLYCOLAX) packet Take 17 g by mouth daily as needed for moderate constipation. 10/20/17   Roxan Hockey, MD  sertraline (ZOLOFT) 100 MG tablet Take 200 mg by mouth daily. 08/10/17   [provider]  sodium chloride (OCEAN) 0.65 % SOLN nasal spray Place 2 sprays into both nostrils 3 (three) times daily. 10/20/17   Roxan Hockey, MD  tamsulosin (FLOMAX) 0.4 MG CAPS capsule Take 1 capsule (0.4 mg total) by mouth daily after supper. For Bladder 10/20/17   Roxan Hockey,  MD    Family History Family History  Problem Relation Age of Onset  . Stroke Neg Hx     Social History Social History   Tobacco Use  . Smoking status: Never Smoker  . Smokeless tobacco: Never Used  Substance Use Topics  . Alcohol use: Never    Frequency: Never  . Drug use: Never     Allergies   Patient has no known allergies.   Review of Systems Review of Systems  Ten systems reviewed and are negative for acute change, except as noted in the HPI.   Physical Exam Updated Vital Signs BP (!) 148/71 (BP Location: Left Arm)   Pulse (!) 109   Temp 98.1 F (36.7 C) (Oral)   Resp 20   SpO2 98%     Physical Exam  Constitutional: She is oriented to person, place, and time. She appears well-developed and well-nourished. No distress.  pale  HENT:  Head: Normocephalic and atraumatic.  Eyes: Conjunctivae are normal. No scleral icterus.  conjunctival pallor  Neck: Normal range of motion.  Cardiovascular: Normal rate, regular rhythm and normal heart sounds. Exam reveals no gallop and no friction rub.  No murmur heard. Pulmonary/Chest: Effort normal and breath sounds normal. No respiratory distress.  Abdominal: Soft. Bowel sounds are normal. She exhibits no distension and no mass. There is no tenderness. There is no guarding.  Neurological: She is alert and oriented to person, place, and time.  Skin: Skin is warm and dry. She is not diaphoretic.  Psychiatric: Her behavior is normal.  Nursing note and vitals reviewed.    ED Treatments / Results  Labs (all labs ordered are listed, but only abnormal results are displayed) Labs Reviewed  CBC WITH DIFFERENTIAL/PLATELET - Abnormal; Notable for the following components:      Result Value   WBC 12.0 (*)    RBC 2.98 (*)    Hemoglobin 7.5 (*)    HCT 23.9 (*)    MCH 25.2 (*)    Platelets 467 (*)    Neutro Abs 10.6 (*)    All other components within normal limits  COMPREHENSIVE METABOLIC PANEL - Abnormal; Notable for the following components:   Sodium 132 (*)    Chloride 94 (*)    Glucose, Bld 240 (*)    BUN 32 (*)    Creatinine, Ser 1.17 (*)    Calcium 8.6 (*)    Total Protein 6.1 (*)    Albumin 3.3 (*)    Alkaline Phosphatase 132 (*)    GFR calc non Af Amer 44 (*)    GFR calc Af Amer 51 (*)    All other components within normal limits  LIPASE, BLOOD - Abnormal; Notable for the following components:   Lipase 52 (*)    All other components within normal limits  PROTIME-INR - Abnormal; Notable for the following components:   Prothrombin Time 16.3 (*)    All other components within normal limits  OCCULT BLOOD GASTRIC /  DUODENUM (SPECIMEN CUP) - Abnormal; Notable for the following components:   Occult Blood, Gastric POSITIVE (*)    All other components within normal limits  PROTIME-INR - Abnormal; Notable for the following components:   Prothrombin Time 16.1 (*)    All other components within normal limits  I-STAT CHEM 8, ED - Abnormal; Notable for the following components:   Potassium 2.9 (*)    BUN 26 (*)    Glucose, Bld 195 (*)    Calcium, Ion 0.85 (*)  TCO2 18 (*)    Hemoglobin 6.8 (*)    HCT 20.0 (*)    All other components within normal limits  I-STAT CHEM 8, ED - Abnormal; Notable for the following components:   Sodium 132 (*)    Chloride 94 (*)    BUN 30 (*)    Glucose, Bld 227 (*)    Calcium, Ion 1.13 (*)    Hemoglobin 7.8 (*)    HCT 23.0 (*)    All other components within normal limits  CULTURE, BLOOD (ROUTINE X 2)  CULTURE, BLOOD (ROUTINE X 2)  APTT  URINALYSIS, ROUTINE W REFLEX MICROSCOPIC  BASIC METABOLIC PANEL  CBC  CBC  CBC  APTT  CEA  CANCER ANTIGEN 19-9  CBC  I-STAT CG4 LACTIC ACID, ED  I-STAT TROPONIN, ED  POCT GASTRIC OCCULT BLOOD (1-CARD TO LAB)  TYPE AND SCREEN  PREPARE RBC (CROSSMATCH)    EKG None  Radiology No results found.  Procedures Procedures (including critical care time) CRITICAL CARE Performed by: Margarita Mail Total critical care time: 60 minutes Critical care time was exclusive of separately billable procedures and treating other patients. Critical care was necessary to treat or prevent imminent or life-threatening deterioration. Critical care was time spent personally by me on the following activities: development of treatment plan with patient and/or surrogate as well as nursing, discussions with consultants, evaluation of patient's response to treatment, examination of patient, obtaining history from patient or surrogate, ordering and performing treatments and interventions, ordering and review of laboratory studies, ordering and review  of radiographic studies, pulse oximetry and re-evaluation of patient's condition.\ Medications Ordered in ED Medications  ondansetron (ZOFRAN) injection 4 mg (has no administration in time range)  ondansetron (ZOFRAN) injection 4 mg (has no administration in time range)  pantoprazole (PROTONIX) 80 mg in sodium chloride 0.9 % 100 mL IVPB (has no administration in time range)  pantoprazole (PROTONIX) 80 mg in sodium chloride 0.9 % 250 mL (0.32 mg/mL) infusion (has no administration in time range)     Initial Impression / Assessment and Plan / ED Course  I have reviewed the triage vital signs and the nursing notes.  Pertinent labs & imaging results that were available during my care of the patient were reviewed by me and considered in my medical decision making (see chart for details).  Clinical Course as of Nov 23 22  Sat Nov 21, 2017  1723 Calcium Ionized(!!): 0.85 [AH]  1724 Hemoglobin(!!): 6.8 [AH]  1724 Potassium(!): 2.9 [AH]  1724 BUN(!): 26 [AH]  1745 I stat redrawn New values . Noted    [AH]  1748 Hemoglobin(!): 7.8 [AH]  1824 Lactic Acid, Venous: 1.15 [AH]  1824 BUN(!): 32 [AH]  1824 Anion gap: 14 [AH]    Clinical Course User Index [AH] Margarita Mail, PA-C   Patient with invasive gallbladder mass, liver metastases, active intractable hematemesis, persistent tachycardia, and mass invading the gastric outlet causing obstruction.  Patient required NG tube placement and blood transfusion.  She is dropped her hemoglobin by 1 g.  I have consulted with Dr. Alessandra Bevels of gastroenterology will see the patient in the morning.  She is also been seen by Dr. Redmond Pulling of general surgery and will be admitted by the hospitalist service.  I discussed the CT findings with the patient.  Her daughter was at bedside.   Final Clinical Impressions(s) / ED Diagnoses   Final diagnoses:  Upper GI bleed  Gallbladder mass  Gastric outlet obstruction  Choledocholithiasis  Intractable vomiting  with nausea, unspecified vomiting type    ED Discharge Orders    None       Margarita Mail, PA-C 11/22/17 [id number]    Gareth Morgan, MD 11/22/17 1134

## 2017-11-21 NOTE — Consult Note (Addendum)
Reason for Consult:gallbladder mass; gastric outlet obstruction Referring Physician: Dr Angelique Blonder Anna Cuevas is an 77 y.o. female.  HPI: 77 yo obese WF with DM, HTN, recent dx of Afib and started on Eliquis in April came to ED for progressive n/v. She states she has been having intermittent n/v for a few months now. It is mainly at night when laying down but now is more progressive and now brown to red tinged. Never hospitalized until late March except for childbirth so her decline over the past 2 months has been hard on pt.  When admitted in late March with n/v/mild dehyrdation and new afib thought GI component was due to GI bug since several family members had GI bug.  Some weight loss recently bc of n/v. Stools are little more irregular now. No prior abd surgery.  No jaundice. She had some anemia during March hospitalization with hgb around 8 and did get 1u prbc before starting on eliquis.   Past Medical History:  Diagnosis Date  . Diabetes mellitus without complication ([place])   . Hypertension     Past Surgical History:  Procedure Laterality Date  . CATARACT EXTRACTION, BILATERAL Bilateral     Family History  Problem Relation Age of Onset  . Stroke Neg Hx     Social History:  reports that she has never smoked. She has never used smokeless tobacco. She reports that she does not drink alcohol or use drugs.  Allergies: No Known Allergies  Medications: I have reviewed the patient's current medications.  Results for orders placed or performed during the hospital encounter of 11/21/17 (from the past 48 hour(s))  CBC with Differential     Status: Abnormal   Collection Time: 11/21/17  5:15 PM  Result Value Ref Range   WBC 12.0 (H) 4.0 - 10.5 K/uL   RBC 2.98 (L) 3.87 - 5.11 MIL/uL   Hemoglobin 7.5 (L) 12.0 - 15.0 g/dL   HCT 23.9 (L) 36.0 - 46.0 %   MCV 80.2 78.0 - 100.0 fL   MCH 25.2 (L) 26.0 - 34.0 pg   MCHC 31.4 30.0 - 36.0 g/dL   RDW 15.4 11.5 - 15.5 %   Platelets 467 (H)  150 - 400 K/uL   Neutrophils Relative % 88 %   Neutro Abs 10.6 (H) 1.7 - 7.7 K/uL   Lymphocytes Relative 7 %   Lymphs Abs 0.8 0.7 - 4.0 K/uL   Monocytes Relative 5 %   Monocytes Absolute 0.6 0.1 - 1.0 K/uL   Eosinophils Relative 0 %   Eosinophils Absolute 0.0 0.0 - 0.7 K/uL   Basophils Relative 0 %   Basophils Absolute 0.0 0.0 - 0.1 K/uL    Comment: Performed at [facility], [address] [place], [place] [address]  Comprehensive metabolic panel     Status: Abnormal   Collection Time: 11/21/17  5:15 PM  Result Value Ref Range   Sodium 132 (L) 135 - 145 mmol/L   Potassium 3.8 3.5 - 5.1 mmol/L   Chloride 94 (L) 101 - 111 mmol/L   CO2 24 22 - 32 mmol/L   Glucose, Bld 240 (H) 65 - 99 mg/dL   BUN 32 (H) 6 - 20 mg/dL   Creatinine, Ser 1.17 (H) 0.44 - 1.00 mg/dL   Calcium 8.6 (L) 8.9 - 10.3 mg/dL   Total Protein 6.1 (L) 6.5 - 8.1 g/dL   Albumin 3.3 (L) 3.5 - 5.0 g/dL   AST 20 15 - 41 U/L  ALT 22 14 - 54 U/L   Alkaline Phosphatase 132 (H) 38 - 126 U/L   Total Bilirubin 0.7 0.3 - 1.2 mg/dL   GFR calc non Af Amer 44 (L) >60 mL/min   GFR calc Af Amer 51 (L) >60 mL/min    Comment: (NOTE) The eGFR has been calculated using the CKD EPI equation. This calculation has not been validated in all clinical situations. eGFR's persistently <60 mL/min signify possible Chronic Kidney Disease.    Anion gap 14 5 - 15    Comment: Performed at [facility] [address] [place], [place] [address]  Lipase, blood     Status: Abnormal   Collection Time: 11/21/17  5:15 PM  Result Value Ref Range   Lipase 52 (H) 11 - 51 U/L    Comment: Performed at [facility] [address], [place], [place] [address]  Type and screen     Status: None (Preliminary result)   Collection Time: 11/21/17  5:15 PM  Result Value Ref Range   ABO/RH(D) O POS    Antibody Screen NEG    Sample Expiration      11/24/2017 Performed at [facility], [place] [address] [place], [place] [address]     Unit Number [id number]    Blood Component Type RED CELLS,LR    Unit division 00    Status of Unit ALLOCATED    Transfusion Status OK TO TRANSFUSE    Crossmatch Result Compatible    Unit Number [id number]    Blood Component Type RED CELLS,LR    Unit division 00    Status of Unit ALLOCATED    Transfusion Status OK TO TRANSFUSE    Crossmatch Result Compatible   Protime-INR     Status: Abnormal   Collection Time: 11/21/17  5:15 PM  Result Value Ref Range   Prothrombin Time 16.3 (H) 11.4 - 15.2 seconds   INR 1.33     Comment: Performed at [facility], [place] [address] [place], [place] [address]  I-stat troponin, ED     Status: None   Collection Time: 11/21/17  5:18 PM  Result Value Ref Range   Troponin i, poc 0.00 0.00 - 0.08 ng/mL   Comment 3            Comment: Due to the release kinetics of cTnI, a negative result within the first hours of the onset of symptoms does not rule out myocardial infarction with certainty. If myocardial infarction is still suspected, repeat the test at appropriate intervals.   I-stat Chem 8, ED     Status: Abnormal   Collection Time: 11/21/17  5:19 PM  Result Value Ref Range   Sodium 135 135 - 145 mmol/L   Potassium 2.9 (L) 3.5 - 5.1 mmol/L   Chloride 105 101 - 111 mmol/L   BUN 26 (H) 6 - 20 mg/dL   Creatinine, Ser 0.70 0.44 - 1.00 mg/dL   Glucose, Bld 195 (H) 65 - 99 mg/dL   Calcium, Ion 0.85 (LL) 1.15 - 1.40 mmol/L   TCO2 18 (L) 22 - 32 mmol/L   Hemoglobin 6.8 (LL) 12.0 - 15.0 g/dL   HCT 20.0 (L) 36.0 - 46.0 %   Comment NOTIFIED PHYSICIAN   I-Stat CG4 Lactic Acid, ED     Status: None   Collection Time: 11/21/17  5:42 PM  Result Value Ref Range   Lactic Acid, Venous 1.15 0.5 - 1.9 mmol/L  I-stat chem 8, ed  Status: Abnormal   Collection Time: 11/21/17  5:42 PM  Result Value Ref Range   Sodium 132 (L) 135 - 145 mmol/L   Potassium 4.1 3.5 - 5.1 mmol/L   Chloride 94 (L) 101 - 111 mmol/L   BUN 30 (H) 6 - 20 mg/dL    Creatinine, Ser 0.90 0.44 - 1.00 mg/dL   Glucose, Bld 227 (H) 65 - 99 mg/dL   Calcium, Ion 1.13 (L) 1.15 - 1.40 mmol/L   TCO2 23 22 - 32 mmol/L   Hemoglobin 7.8 (L) 12.0 - 15.0 g/dL   HCT 23.0 (L) 36.0 - 46.0 %  Occult bld gastric/duodenum (cup to lab)     Status: Abnormal   Collection Time: 11/21/17  6:35 PM  Result Value Ref Range   pH, Gastric NOT DONE    Occult Blood, Gastric POSITIVE (A) NEGATIVE    Comment: Performed at [facility], [address] [place], [place] [address]  Prepare RBC     Status: None   Collection Time: 11/21/17  9:45 PM  Result Value Ref Range   Order Confirmation      ORDER PROCESSED BY BLOOD BANK Performed at [facility], [place] [address], [place], [place] [address]     Ct Abdomen Pelvis W Contrast  Result Date: 11/21/2017 CLINICAL DATA:  Hematemesis. EXAM: CT ABDOMEN AND PELVIS WITH CONTRAST TECHNIQUE: Multidetector CT imaging of the abdomen and pelvis was performed using the standard protocol following bolus administration of intravenous contrast. CONTRAST:  42m ISOVUE-300 IOPAMIDOL (ISOVUE-300) INJECTION 61% COMPARISON:  None. FINDINGS: Lower chest: Mild faint peribronchovascular ground-glass densities at the lung bases may reflect microaspiration. Prominent distention of the distal esophagus. Lipoma of the interatrial septum. Hepatobiliary: Heterogeneous, ill-defined gallbladder invading the adjacent liver. There are several hypoenhancing lesions in the right liver measuring up to 2.0 cm. Cholelithiasis. Mild central intrahepatic biliary dilatation. There are 3 mm gallstones in the proximal and distal common bile duct, which is nondilated. Pancreas: Unremarkable. No pancreatic ductal dilatation or surrounding inflammatory changes. Spleen: Normal in size without focal abnormality. Adrenals/Urinary Tract: The adrenal glands are unremarkable. Mild bilateral renal atrophy. No focal renal lesion. No renal or ureteral calculi. No hydronephrosis. The  bladder is unremarkable. Stomach/Bowel: The stomach is markedly distended with focal narrowing of the first portion of the duodenum due to the gallbladder mass. Remaining small bowel is decompressed. The colon is unremarkable. Vascular/Lymphatic: Aortic atherosclerosis. No enlarged abdominal or pelvic lymph nodes. Reproductive: Calcified fibroid.  No adnexal mass. Other: Trace perihepatic and perisplenic free fluid. No pneumoperitoneum. Diastasis of the right lateral abdominal wall. Musculoskeletal: No acute or significant osseous findings. Degenerative changes of the lumbar spine. IMPRESSION: 1. Heterogeneous, ill-defined gallbladder invading the adjacent liver, consistent with gallbladder carcinoma. Several hepatic metastases measuring up to 2.0 cm. 2. Severe distention of the stomach with gastric outlet obstruction secondary to focal narrowing and invasion of the first portion of the duodenum by the gallbladder mass. 3. Mild central intrahepatic biliary dilatation. Choledocholithiasis with small 3 mm gallstones in the nondilated proximal and distal common bile duct. 4.  Aortic atherosclerosis (ICD10-I70.0). Electronically Signed   By: WTitus DubinM.D.   On: 11/21/2017 20:30    Review of Systems  Gastrointestinal: Positive for constipation, nausea and vomiting. Negative for abdominal pain, blood in stool, diarrhea and melena.   Blood pressure 136/68, pulse (!) 101, temperature 98.1 F (36.7 C), temperature source Oral, resp. rate (!) 23, SpO2 98 %. Physical Exam  Vitals reviewed. Constitutional:  She is oriented to person, place, and time. She appears well-developed and well-nourished.  Non-toxic appearance. She has a sickly appearance. No distress.  HENT:  Head: Normocephalic and atraumatic.  Right Ear: External ear normal.  Left Ear: External ear normal.  Eyes: Conjunctivae are normal. No scleral icterus.  Neck: Normal range of motion. Neck supple. No tracheal deviation present. No thyromegaly  present.  Cardiovascular: Normal rate, regular rhythm and intact distal pulses.  Murmur heard.  Systolic murmur is present. ?SEM  Respiratory: Effort normal and breath sounds normal. No stridor. No respiratory distress. She has no wheezes.  Very mild coarse BS on RT  GI: Normal appearance. There is tenderness. There is no rigidity, no rebound and no guarding.  Obese, bloated. Mild TTP; rt lateral abd wall laxity  Musculoskeletal: She exhibits no edema or tenderness.  Lymphadenopathy:    She has no cervical adenopathy.  Neurological: She is alert and oriented to person, place, and time. She exhibits normal muscle tone.  Skin: Skin is warm and dry. No rash noted. She is not diaphoretic. No erythema. There is pallor.  Small superficial sacral decub  Psychiatric: She has a normal mood and affect. Her behavior is normal. Judgment and thought content normal.    Assessment/Plan: Nausea/vomiting  Gastric outlet obstruction  Abnormal appearing gallbladder appearing to invade liver and duodenum Right hepatic liver masses x 3 Acute on chronic anemia Anticoagulated on eliquis DM HTN Choledocholithiasis GI bleed  At first glance she appears to have locally advanced gallbladder cancer causing gastric outlet obstruction.   Admit SDU Place NG tube to LIWS to decompress stomach Hold eliquis Check CEA and CA 19-9 in am PPI Transfuse 1u prbc Repeat cbc in am NPO except ice chips  Went over imaging with pt and daughter. Explained need for NG tube decompression and that her GB is abnormal and with the liver lesions and invasion into D1 this is all concerning for a malignant process and that radical surgery would not improve her long term survival if this is in fact cancer  Explained that evaluation and workup will take several days or longer, different teams, there are difft ways to work up and not one correct way.   2 issues - need to try to confirm cancer dx and address the Gastric outlet  obstruction long term mgmt  Will need GI medicine consult, oncology consult  Will need CT chest to evaluate for lung mets Could consider bx of liver lesion vs upper endo to look at distal stomach/D1 with poss bx  Right now doesn't appear to have biliary obstruction from mass or CBD stones  Ideally like her to be presented at GI tumor board  Total time 65 minutes  Leighton Ruff. Redmond Pulling, MD, FACS General, Bariatric, & Minimally Invasive Surgery [facility]   Greer Pickerel 11/21/2017, 11:01 PM

## 2017-11-21 NOTE — ED Triage Notes (Signed)
Patient from home after having multiple events of coffee brown to red tinged emesis.  Went to PCP who recommended coming to ED.  Patient is pale and required blood transfusions in the past.

## 2017-11-21 NOTE — H&P (Signed)
History and Physical    Anna Cuevas BZJ:[id number] DOB: 29-Jan-1941 DOA: 11/21/2017  Referring MD/NP/PA:   PCP: Kathyrn Lass, MD   Patient coming from:  The patient is coming from home.  At baseline, pt is partially dependent for most of ADL.   Chief Complaint: Hematemesis  HPI: Anna Cuevas is a 77 y.o. female with medical history significant of atrial fibrillation on Eliquis, hypertension, diabetes mellitus, GERD, depression, who presents with hematemesis.  Patient was recently hospitalized 5 weeks ago due to nausea, vomiting and was found to have new onset of atrial fibrillation.  Patient was discharged on Eliquis.  During that admission, patient was found to have anemia and received 1 unit of blood transfusion.  Patient states that she continues to have nausea vomiting after went home. She vomited several times each day.  In the past 3 days, she has intermittently vomited dark-colored blood.  Patient denies abdominal pain.  She is constipated in the past 3 days.  Her PCP treated her with MiraLAX, which seems to have made hematemesis worse.  Per daughter, patient last dose of Eliquis was 24 hours ago.  Patient does not have chest pain, shortness of breath, dizziness, lightheadedness.  She has a generalized weakness, no unilateral weakness, facial droop or slurred speech.  No cough, fever or chills.  Denies symptoms of UTI.  ED Course: pt was found to have hemoglobin 7.5 which was 8.3 on 10/20/2016, WBC 12.0, sodium 132, creatinine normal, vomitus test positive for FOBT, tachycardia, tachypnea, oxygen saturation 98% on room air, temperature normal.  Patient is admitted to stepdown as inpatient.  Dr. Alessandra Bevels of GI and Dr. Redmond Pulling for surgery were consulted.  CT abdomen/pelvis showed: 1. Heterogeneous, ill-defined gallbladder invading the adjacent liver, consistent with gallbladder carcinoma. Several hepatic metastases measuring up to 2.0 cm. 2. Severe distention of the stomach with gastric  outlet obstruction secondary to focal narrowing and invasion of the first portion ofthe duodenum by the gallbladder mass.  3. Mild central intrahepatic biliary dilatation. Choledocholithiasis with small 3 mm gallstones in the nondilated proximal and distal common bile duct. Review of Systems:   General: no fevers, chills, no body weight gain, has poor appetite, has fatigue HEENT: no blurry vision, hearing changes or sore throat Respiratory: no dyspnea, coughing, wheezing CV: no chest pain, no palpitations GI: has nausea, vomiting, hematemesis, constipation, No diarrhea,abdominal pain, GU: no dysuria, burning on urination, increased urinary frequency, hematuria  Ext: no leg edema Neuro: no unilateral weakness, numbness, or tingling, no vision change or hearing loss Skin: has pressure ulcer MSK: No muscle spasm, no deformity, no limitation of range of movement in spin Heme: No easy bruising.  Travel history: No recent long distant travel.  Allergy: No Known Allergies  Past Medical History:  Diagnosis Date  . Diabetes mellitus without complication ([place])   . Hypertension     Past Surgical History:  Procedure Laterality Date  . CATARACT EXTRACTION, BILATERAL Bilateral     Social History:  reports that she has never smoked. She has never used smokeless tobacco. She reports that she does not drink alcohol or use drugs.  Family History:  Family History  Problem Relation Age of Onset  . Stroke Neg Hx      Prior to Admission medications   Medication Sig Start Date End Date Taking? Authorizing Provider  acetaminophen (TYLENOL) 325 MG tablet Take 2 tablets (650 mg total) by mouth every 6 (six) hours as needed for mild pain (or Fever >/= 101).  10/20/17   Roxan Hockey, MD  apixaban (ELIQUIS) 5 MG TABS tablet Take 1 tablet (5 mg total) by mouth 2 (two) times daily. 10/20/17   Roxan Hockey, MD  BISACODYL LAXATIVE PO Take 1 tablet by mouth as needed (constipation).    [provider]  diltiazem (CARDIZEM CD) 360 MG 24 hr capsule Take 1 capsule (360 mg total) by mouth daily. 10/20/17 10/20/18  Roxan Hockey, MD  docusate sodium (COLACE) 100 MG capsule Take 200 mg by mouth as needed for mild constipation.    [provider]  Menthol-Methyl Salicylate (ICY HOT) 85-63 % STCK Apply 1 application topically as needed (Back and Neck pain).    [provider]  metFORMIN (GLUCOPHAGE) 500 MG tablet Take 1 tablet (500 mg total) by mouth daily. 10/20/17   Roxan Hockey, MD  metoprolol tartrate (LOPRESSOR) 25 MG tablet Take 1 tablet (25 mg total) by mouth 2 (two) times daily. 10/20/17   Roxan Hockey, MD  ondansetron (ZOFRAN) 4 MG tablet Take 1 tablet (4 mg total) by mouth every 6 (six) hours as needed for nausea. 10/20/17   Roxan Hockey, MD  pantoprazole (PROTONIX) 40 MG tablet Take 1 tablet (40 mg total) by mouth daily. 10/21/17   Roxan Hockey, MD  polyethylene glycol (MIRALAX / GLYCOLAX) packet Take 17 g by mouth daily as needed for moderate constipation. 10/20/17   Roxan Hockey, MD  sertraline (ZOLOFT) 100 MG tablet Take 200 mg by mouth daily. 08/10/17   [provider]  sodium chloride (OCEAN) 0.65 % SOLN nasal spray Place 2 sprays into both nostrils 3 (three) times daily. 10/20/17   Roxan Hockey, MD  tamsulosin (FLOMAX) 0.4 MG CAPS capsule Take 1 capsule (0.4 mg total) by mouth daily after supper. For Bladder 10/20/17   Roxan Hockey, MD    Physical Exam: Vitals:   11/21/17 2320 11/21/17 2345 11/22/17 0000 11/22/17 0125  BP: (!) 152/87 (!) 142/67 (!) 145/110   Pulse: (!) 104 (!) 112 (!) 107   Resp: (!) 27 20 (!) 26   Temp:    98.2 F (36.8 C)  TempSrc:      SpO2: 98% 98% 97%    General: Not in acute distress. Pale looking. HEENT:       Eyes: PERRL, EOMI, no scleral icterus.       ENT: No discharge from the ears and nose, no pharynx injection, no tonsillar enlargement.        Neck: No JVD, no bruit, no mass felt. Heme: No  neck lymph node enlargement. Cardiac: J4/H7, RRR, 1/6 systolic murmurs, No gallops or rubs. Respiratory: No rales, wheezing, rhonchi or rubs. GI: Soft, nondistended, nontender, no rebound pain, no organomegaly, BS present. GU: No hematuria Ext: No pitting leg edema bilaterally. 2+DP/PT pulse bilaterally. Musculoskeletal: No joint deformities, No joint redness or warmth, no limitation of ROM in spin. Skin: has pressure ulcer Neuro: Alert, oriented X3, cranial nerves II-XII grossly intact, moves all extremities normally.  Psych: Patient is not psychotic, no suicidal or hemocidal ideation.  Labs on Admission: I have personally reviewed following labs and imaging studies  CBC: Recent Labs  Lab 11/21/17 1715 11/21/17 1719 11/21/17 1742  WBC 12.0*  --   --   NEUTROABS 10.6*  --   --   HGB 7.5* 6.8* 7.8*  HCT 23.9* 20.0* 23.0*  MCV 80.2  --   --   PLT 467*  --   --    Basic Metabolic Panel: Recent Labs  Lab 11/21/17  1715 11/21/17 1719 11/21/17 1742  NA 132* 135 132*  K 3.8 2.9* 4.1  CL 94* 105 94*  CO2 24  --   --   GLUCOSE 240* 195* 227*  BUN 32* 26* 30*  CREATININE 1.17* 0.70 0.90  CALCIUM 8.6*  --   --    GFR: CrCl cannot be calculated (Unknown ideal weight.). Liver Function Tests: Recent Labs  Lab 11/21/17 1715  AST 20  ALT 22  ALKPHOS 132*  BILITOT 0.7  PROT 6.1*  ALBUMIN 3.3*   Recent Labs  Lab 11/21/17 1715  LIPASE 52*   No results for input(s): AMMONIA in the last 168 hours. Coagulation Profile: Recent Labs  Lab 11/21/17 1715 11/21/17 2352  INR 1.33 1.30   Cardiac Enzymes: No results for input(s): CKTOTAL, CKMB, CKMBINDEX, TROPONINI in the last 168 hours. BNP (last 3 results) No results for input(s): PROBNP in the last 8760 hours. HbA1C: No results for input(s): HGBA1C in the last 72 hours. CBG: Recent Labs  Lab 11/22/17 0111  GLUCAP 236*   Lipid Profile: No results for input(s): CHOL, HDL, LDLCALC, TRIG, CHOLHDL, LDLDIRECT in the last  72 hours. Thyroid Function Tests: No results for input(s): TSH, T4TOTAL, FREET4, T3FREE, THYROIDAB in the last 72 hours. Anemia Panel: No results for input(s): VITAMINB12, FOLATE, FERRITIN, TIBC, IRON, RETICCTPCT in the last 72 hours. Urine analysis:    Component Value Date/Time   COLORURINE YELLOW 10/15/2017 1032   APPEARANCEUR HAZY (A) 10/15/2017 1032   LABSPEC 1.015 10/15/2017 1032   PHURINE 5.0 10/15/2017 1032   GLUCOSEU NEGATIVE 10/15/2017 1032   HGBUR NEGATIVE 10/15/2017 1032   BILIRUBINUR NEGATIVE 10/15/2017 1032   KETONESUR NEGATIVE 10/15/2017 1032   PROTEINUR NEGATIVE 10/15/2017 1032   NITRITE NEGATIVE 10/15/2017 1032   LEUKOCYTESUR MODERATE (A) 10/15/2017 1032   Sepsis Labs: @LABRCNTIP (procalcitonin:4,lacticidven:4) )No results found for this or any previous visit (from the past 240 hour(s)).   Radiological Exams on Admission: Ct Abdomen Pelvis W Contrast  Result Date: 11/21/2017 CLINICAL DATA:  Hematemesis. EXAM: CT ABDOMEN AND PELVIS WITH CONTRAST TECHNIQUE: Multidetector CT imaging of the abdomen and pelvis was performed using the standard protocol following bolus administration of intravenous contrast. CONTRAST:  34mL ISOVUE-300 IOPAMIDOL (ISOVUE-300) INJECTION 61% COMPARISON:  None. FINDINGS: Lower chest: Mild faint peribronchovascular ground-glass densities at the lung bases may reflect microaspiration. Prominent distention of the distal esophagus. Lipoma of the interatrial septum. Hepatobiliary: Heterogeneous, ill-defined gallbladder invading the adjacent liver. There are several hypoenhancing lesions in the right liver measuring up to 2.0 cm. Cholelithiasis. Mild central intrahepatic biliary dilatation. There are 3 mm gallstones in the proximal and distal common bile duct, which is nondilated. Pancreas: Unremarkable. No pancreatic ductal dilatation or surrounding inflammatory changes. Spleen: Normal in size without focal abnormality. Adrenals/Urinary Tract: The adrenal  glands are unremarkable. Mild bilateral renal atrophy. No focal renal lesion. No renal or ureteral calculi. No hydronephrosis. The bladder is unremarkable. Stomach/Bowel: The stomach is markedly distended with focal narrowing of the first portion of the duodenum due to the gallbladder mass. Remaining small bowel is decompressed. The colon is unremarkable. Vascular/Lymphatic: Aortic atherosclerosis. No enlarged abdominal or pelvic lymph nodes. Reproductive: Calcified fibroid.  No adnexal mass. Other: Trace perihepatic and perisplenic free fluid. No pneumoperitoneum. Diastasis of the right lateral abdominal wall. Musculoskeletal: No acute or significant osseous findings. Degenerative changes of the lumbar spine. IMPRESSION: 1. Heterogeneous, ill-defined gallbladder invading the adjacent liver, consistent with gallbladder carcinoma. Several hepatic metastases measuring up to 2.0 cm. 2. Severe distention  of the stomach with gastric outlet obstruction secondary to focal narrowing and invasion of the first portion of the duodenum by the gallbladder mass. 3. Mild central intrahepatic biliary dilatation. Choledocholithiasis with small 3 mm gallstones in the nondilated proximal and distal common bile duct. 4.  Aortic atherosclerosis (ICD10-I70.0). Electronically Signed   By: Titus Dubin M.D.   On: 11/21/2017 20:30     EKG: Independently reviewed.  Sinus rhythm, QTC 415, tachycardia, low voltage, nonspecific T wave change.  Assessment/Plan Principal Problem:   GIB (gastrointestinal bleeding) Active Problems:   Atrial fibrillation with rapid ventricular response (HCC)   N&V (nausea and vomiting)   Diabetes mellitus without complication (HCC)   Pressure injury of skin   Depression   Essential hypertension   GERD (gastroesophageal reflux disease)   Gallbladder carcinoma (HCC)   Blood loss anemia   GIB (gastrointestinal bleeding) and blood loss anemia: Hgb dropped from 8.3 to 7.5. Pt continues to have  hematemesis in the ED. She has tachycardia. She is currently hemodynamically stable, but at highly risk of deteriorating. Dr. Alessandra Bevels of gastroenterology was consulted by ED physician.  - will admit to SDU as inpt - hold off Eliquis - give Kcentra - transfuse 1 units of blood now - GI consulted by Ed, will follow up recommendations - NPO - IVF: 1L NS bolus, then at 1oo mL/hr - Start IV pantoprazole gtt - Zofran IV for nausea - Avoid NSAIDs and SQ heparin - Maintain IV access (2 large bore IVs if possible). - Monitor closely and follow q6h cbc, transfuse as necessary, if Hgb<7.0 - LaB: INR, PTT and type screen  Gallbladder carcinoma: CT scan showed possible gallbladder carcinoma, with several hepatic metastases and focal narrowing and invasion of the first portion ofthe duodenum, causing gastric outlet obstruction.  Dr. Redmond Pulling of stent surgeon was consulted.  -NG tube per Dr. Redmond Pulling -pt will need CT of chest to evaluate for lung mets per Dr. Redmond Pulling, since pt just received contrast. I will hold CT scan today. -please call oncology in AM -check CEA and CA 19-9 per Dr. Redmond Pulling  Choledocholithiasis: CT showed with small 3 mm gallstones in the nondilated proximal and distal common bile duct. -f/u surgeon's recommendation  Atrial fibrillation with rapid ventricular response ([place]): CHA2DS2-VASc Score is 5, needs oral anticoagulation. Patient is on Eliquis at home. INR is  on admission. Heart rate is 100-110s.  -hold Eliquis -IV metoprolol with holding parameters  N&V (nausea and vomiting): Secondary to metastasized gallbladder carcinoma and gastric outlet obstruction. -NG tube -Symptomatic treatment and IV fluids  Diabetes mellitus without complication ([place]): Last A1c 6.9 on 4/2/9, well controled. Patient is taking metformin at home -SSI  GERD: -Protonix IV  Pressure injury of skin: -wound care consult  Depression: -hold oral med due to NG tube  Essential hypertension: -hold  oral meds -IV hydralazine PRN -On IV metoprolol   DVT ppx: SCD Code Status: FUll code (I discussed with patient in the presence of her daughter, and explained the meaning of [place]. Patient wants to be full code) Family Communication:  Yes, patient's daughter at bed side Disposition Plan:  Anticipate discharge back to previous home environment Consults called:  Dr. Alessandra Bevels of GI and Dr. Redmond Pulling of surgery Admission status: SDU/inpation       Date of Service 11/22/2017    [facility] Pager [phone]  If 7PM-7AM, please contact night-coverage [email or web address] Password [email or web address] 11/22/2017, 1:36 AM

## 2017-11-22 ENCOUNTER — Other Ambulatory Visit: Payer: Self-pay

## 2017-11-22 ENCOUNTER — Encounter (HOSPITAL_COMMUNITY): Payer: Self-pay | Admitting: *Deleted

## 2017-11-22 DIAGNOSIS — D500 Iron deficiency anemia secondary to blood loss (chronic): Secondary | ICD-10-CM | POA: Diagnosis present

## 2017-11-22 DIAGNOSIS — K921 Melena: Secondary | ICD-10-CM

## 2017-11-22 LAB — CBC
HCT: 21.7 % — ABNORMAL LOW (ref 36.0–46.0)
HCT: 20.6 % — ABNORMAL LOW (ref 36.0–46.0)
HEMATOCRIT: 23.3 % — AB (ref 36.0–46.0)
HEMOGLOBIN: 7.4 g/dL — AB (ref 12.0–15.0)
Hemoglobin: 7 g/dL — ABNORMAL LOW (ref 12.0–15.0)
Hemoglobin: 6.6 g/dL — CL (ref 12.0–15.0)
MCH: 25.7 pg — AB (ref 26.0–34.0)
MCH: 25.9 pg — AB (ref 26.0–34.0)
MCH: 25.9 pg — AB (ref 26.0–34.0)
MCHC: 31.8 g/dL (ref 30.0–36.0)
MCHC: 32.3 g/dL (ref 30.0–36.0)
MCHC: 32 g/dL (ref 30.0–36.0)
MCV: 80.9 fL (ref 78.0–100.0)
MCV: 80.4 fL (ref 78.0–100.0)
MCV: 80.8 fL (ref 78.0–100.0)
PLATELETS: 333 10*3/uL (ref 150–400)
Platelets: 341 10*3/uL (ref 150–400)
Platelets: 294 10*3/uL (ref 150–400)
RBC: 2.88 MIL/uL — ABNORMAL LOW (ref 3.87–5.11)
RBC: 2.7 MIL/uL — ABNORMAL LOW (ref 3.87–5.11)
RBC: 2.55 MIL/uL — ABNORMAL LOW (ref 3.87–5.11)
RDW: 14.8 % (ref 11.5–15.5)
RDW: 14.8 % (ref 11.5–15.5)
RDW: 14.9 % (ref 11.5–15.5)
WBC: 10.6 10*3/uL — ABNORMAL HIGH (ref 4.0–10.5)
WBC: 9.3 10*3/uL (ref 4.0–10.5)
WBC: 8.4 10*3/uL (ref 4.0–10.5)

## 2017-11-22 LAB — URINALYSIS, ROUTINE W REFLEX MICROSCOPIC
BILIRUBIN URINE: NEGATIVE
Glucose, UA: NEGATIVE mg/dL
HGB URINE DIPSTICK: NEGATIVE
Ketones, ur: 5 mg/dL — AB
NITRITE: NEGATIVE
Protein, ur: NEGATIVE mg/dL
pH: 5 (ref 5.0–8.0)

## 2017-11-22 LAB — BASIC METABOLIC PANEL
ANION GAP: 12 (ref 5–15)
BUN: 34 mg/dL — ABNORMAL HIGH (ref 6–20)
CO2: 25 mmol/L (ref 22–32)
Calcium: 8.4 mg/dL — ABNORMAL LOW (ref 8.9–10.3)
Chloride: 97 mmol/L — ABNORMAL LOW (ref 101–111)
Creatinine, Ser: 1.02 mg/dL — ABNORMAL HIGH (ref 0.44–1.00)
GFR calc Af Amer: 60 mL/min — ABNORMAL LOW (ref >60)
GFR calc non Af Amer: 52 mL/min — ABNORMAL LOW (ref >60)
GLUCOSE: 193 mg/dL — AB (ref 65–99)
POTASSIUM: 3.9 mmol/L (ref 3.5–5.1)
Sodium: 134 mmol/L — ABNORMAL LOW (ref 135–145)

## 2017-11-22 LAB — PROTIME-INR
INR: 1.3
Prothrombin Time: 16.1 seconds — ABNORMAL HIGH (ref 11.4–15.2)

## 2017-11-22 LAB — MRSA PCR SCREENING: MRSA BY PCR: NEGATIVE

## 2017-11-22 LAB — GLUCOSE, CAPILLARY
GLUCOSE-CAPILLARY: 146 mg/dL — AB (ref 65–99)
Glucose-Capillary: 150 mg/dL — ABNORMAL HIGH (ref 65–99)

## 2017-11-22 LAB — APTT: aPTT: 32 seconds (ref 24–36)

## 2017-11-22 LAB — CBG MONITORING, ED
Glucose-Capillary: 236 mg/dL — ABNORMAL HIGH (ref 65–99)
Glucose-Capillary: 155 mg/dL — ABNORMAL HIGH (ref 65–99)

## 2017-11-22 LAB — PREPARE RBC (CROSSMATCH)

## 2017-11-22 MED ORDER — SODIUM CHLORIDE 0.9 % IV SOLN: INTRAVENOUS | Status: DC

## 2017-11-22 MED ORDER — SODIUM CHLORIDE 0.9 % IV SOLN
Freq: Once | INTRAVENOUS | Status: AC
  Administered 2017-11-22: 20:00:00 via INTRAVENOUS

## 2017-11-22 NOTE — ED Notes (Signed)
No blood draw at this time,   Pt receiving blood transfusion. 

## 2017-11-22 NOTE — ED Notes (Signed)
Wound at right buttock noted with reddened area. Daughter states this has improved over time. mwoun d noted as size of pea.

## 2017-11-22 NOTE — Consult Note (Addendum)
Referring Provider: ER Primary Care Physician:  Kathyrn Lass, MD Primary Gastroenterologist:  unassigned  Reason for Consultation:  Nausea, vomiting, coffee-ground emesis  HPI: Anna Cuevas is a 77 y.o. female with past medical history of hypertension, diabetes, atrial fibrillation was recently started on Eliquis presented to the hospital with nausea, vomiting and coffee-ground emesis.initial evaluation in the emergency room showed hemoglobin of 7.5. CT abdomen pelvis showed multiple abnormalities including finding concerning for gallbladder carcinoma with hepatic metastasis as well as possible gastric outlet obstruction from focal narrowing and invasion of gallbladder into the first portion of the duodenum. It is Also showed choledocholithiasis with 3 mm stone into the common bile duct. GI is consulted for further evaluation.  Patient seen and examined at bedside in the emergency room. Daughter at bedside.Patient has not been doing well since last January 2019. She was feeling weakness and was fatigued. Had intermittent nausea. Was admitted in March and was diagnosed with atrial fibrillation. Started having frequent episodes of nausea and vomiting 5 days ago.also noted coffee-ground emesis Denies any abdominal pain. Denies any diarrhea or constipation. Denies any blood in the stool.Complaining of decreased appetite. Complaining of abdominal distention.   feeling better after NG tube placement.no previous EGD.   Had colonoscopyin 2011  by Dr. Olevia Perches  which showed small tubular adenoma at 30 cm. Repeat colonoscopy was recommended in 5 years.  Past Medical History:  Diagnosis Date  . Diabetes mellitus without complication ([place])   . Hypertension     Past Surgical History:  Procedure Laterality Date  . CATARACT EXTRACTION, BILATERAL Bilateral     Prior to Admission medications   Medication Sig Start Date End Date Taking? Authorizing Provider  acetaminophen (TYLENOL) 325 MG tablet Take 2  tablets (650 mg total) by mouth every 6 (six) hours as needed for mild pain (or Fever >/= 101). 10/20/17  Yes Emokpae, Courage, MD  apixaban (ELIQUIS) 5 MG TABS tablet Take 1 tablet (5 mg total) by mouth 2 (two) times daily. 10/20/17  Yes Emokpae, Courage, MD  bisacodyl (DULCOLAX) 5 MG EC tablet Take 5 mg by mouth daily as needed for moderate constipation.   Yes [provider]  diltiazem (CARDIZEM CD) 360 MG 24 hr capsule Take 1 capsule (360 mg total) by mouth daily. 10/20/17 10/20/18 Yes Emokpae, Courage, MD  docusate sodium (COLACE) 100 MG capsule Take 200 mg by mouth as needed for mild constipation.   Yes [provider]  loratadine (CLARITIN) 10 MG tablet Take 10 mg by mouth at bedtime.   Yes [provider]  metFORMIN (GLUCOPHAGE) 500 MG tablet Take 1 tablet (500 mg total) by mouth daily. 10/20/17  Yes Emokpae, Courage, MD  metoprolol tartrate (LOPRESSOR) 25 MG tablet Take 1 tablet (25 mg total) by mouth 2 (two) times daily. 10/20/17  Yes Emokpae, Courage, MD  montelukast (SINGULAIR) 10 MG tablet Take 10 mg by mouth at bedtime.   Yes [provider]  ondansetron (ZOFRAN) 4 MG tablet Take 1 tablet (4 mg total) by mouth every 6 (six) hours as needed for nausea. 10/20/17  Yes Emokpae, Courage, MD  pantoprazole (PROTONIX) 40 MG tablet Take 1 tablet (40 mg total) by mouth daily. 10/21/17  Yes Emokpae, Courage, MD  polyethylene glycol (MIRALAX / GLYCOLAX) packet Take 17 g by mouth daily as needed for moderate constipation. 10/20/17  Yes Emokpae, Courage, MD  sertraline (ZOLOFT) 100 MG tablet Take 200 mg by mouth daily. 08/10/17  Yes [provider]    Scheduled Meds: .  insulin aspart  0-5 Units Subcutaneous QHS  . insulin aspart  0-9 Units Subcutaneous TID WC  . metoprolol tartrate  5 mg Intravenous Q8H   Continuous Infusions: . sodium chloride 100 mL/hr (11/22/17 0744)  . pantoprozole (PROTONIX) infusion 8 mg/hr (11/22/17 0827)   PRN Meds:.acetaminophen **OR**  acetaminophen, hydrALAZINE, ondansetron **OR** ondansetron (ZOFRAN) IV  Allergies as of 11/21/2017  . (No Known Allergies)    Family History  Problem Relation Age of Onset  . Stroke Neg Hx     Social History   Socioeconomic History  . Marital status: Widowed    Spouse name: Not on file  . Number of children: Not on file  . Years of education: Not on file  . Highest education level: Not on file  Occupational History  . Not on file  Social Needs  . Financial resource strain: Not on file  . Food insecurity:    Worry: Not on file    Inability: Not on file  . Transportation needs:    Medical: Not on file    Non-medical: Not on file  Tobacco Use  . Smoking status: Never Smoker  . Smokeless tobacco: Never Used  Substance and Sexual Activity  . Alcohol use: Never    Frequency: Never  . Drug use: Never  . Sexual activity: Not on file  Lifestyle  . Physical activity:    Days per week: Not on file    Minutes per session: Not on file  . Stress: Not on file  Relationships  . Social connections:    Talks on phone: Not on file    Gets together: Not on file    Attends religious service: Not on file    Active member of club or organization: Not on file    Attends meetings of clubs or organizations: Not on file    Relationship status: Not on file  . Intimate partner violence:    Fear of current or ex partner: Not on file    Emotionally abused: Not on file    Physically abused: Not on file    Forced sexual activity: Not on file  Other Topics Concern  . Not on file  Social History Narrative  . Not on file    Review of Systems: Review of Systems  Constitutional: Positive for malaise/fatigue. Negative for chills and fever.  HENT: Negative for hearing loss and tinnitus.   Eyes: Negative for blurred vision and double vision.  Respiratory: Negative for cough, hemoptysis and sputum production.   Cardiovascular: Negative for chest pain and palpitations.  Gastrointestinal:  Positive for heartburn, nausea and vomiting. Negative for abdominal pain, blood in stool, constipation and melena.  Genitourinary: Negative for dysuria and urgency.  Musculoskeletal: Positive for myalgias. Negative for neck pain.  Skin: Negative for itching and rash.  Neurological: Negative for seizures and loss of consciousness.  Endo/Heme/Allergies: Does not bruise/bleed easily.  Psychiatric/Behavioral: Negative for hallucinations and suicidal ideas.    Physical Exam: Vital signs: Vitals:   11/22/17 0545 11/22/17 0743  BP: 138/78 (!) 152/75  Pulse: 86 90  Resp: (!) 23 (!) 22  Temp:    SpO2: 95% 99%     Physical Exam  Constitutional: She is oriented to person, place, and time. She appears well-developed and well-nourished. No distress.  HENT:  Head: Normocephalic and atraumatic.  Mouth/Throat: Oropharynx is clear and moist. No oropharyngeal exudate.  NG tube in place  Eyes: EOM are normal. No scleral icterus.  Neck: Normal range of motion. Neck  supple.  Cardiovascular:  IRRR  Pulmonary/Chest: Effort normal and breath sounds normal. No respiratory distress.  Abdominal: Soft. Bowel sounds are normal. She exhibits no distension. There is no tenderness. There is no rebound and no guarding.  Musculoskeletal: Normal range of motion. She exhibits no edema.  Neurological: She is alert and oriented to person, place, and time.  Skin: Skin is warm. No erythema.  Psychiatric: She has a normal mood and affect. Judgment normal.    GI:  Lab Results: Recent Labs    11/21/17 1715 11/21/17 1719 11/21/17 1742 11/22/17 0438  WBC 12.0*  --   --  10.6*  HGB 7.5* 6.8* 7.8* 7.4*  HCT 23.9* 20.0* 23.0* 23.3*  PLT 467*  --   --  341   BMET Recent Labs    11/21/17 1715 11/21/17 1719 11/21/17 1742 11/22/17 0438  NA 132* 135 132* 134*  K 3.8 2.9* 4.1 3.9  CL 94* 105 94* 97*  CO2 24  --   --  25  GLUCOSE 240* 195* 227* 193*  BUN 32* 26* 30* 34*  CREATININE 1.17* 0.70 0.90 1.02*   CALCIUM 8.6*  --   --  8.4*   LFT Recent Labs    11/21/17 1715  PROT 6.1*  ALBUMIN 3.3*  AST 20  ALT 22  ALKPHOS 132*  BILITOT 0.7   PT/INR Recent Labs    11/21/17 1715 11/21/17 2352  LABPROT 16.3* 16.1*  INR 1.33 1.30     Studies/Results: Ct Abdomen Pelvis W Contrast  Result Date: 11/21/2017 CLINICAL DATA:  Hematemesis. EXAM: CT ABDOMEN AND PELVIS WITH CONTRAST TECHNIQUE: Multidetector CT imaging of the abdomen and pelvis was performed using the standard protocol following bolus administration of intravenous contrast. CONTRAST:  39mL ISOVUE-300 IOPAMIDOL (ISOVUE-300) INJECTION 61% COMPARISON:  None. FINDINGS: Lower chest: Mild faint peribronchovascular ground-glass densities at the lung bases may reflect microaspiration. Prominent distention of the distal esophagus. Lipoma of the interatrial septum. Hepatobiliary: Heterogeneous, ill-defined gallbladder invading the adjacent liver. There are several hypoenhancing lesions in the right liver measuring up to 2.0 cm. Cholelithiasis. Mild central intrahepatic biliary dilatation. There are 3 mm gallstones in the proximal and distal common bile duct, which is nondilated. Pancreas: Unremarkable. No pancreatic ductal dilatation or surrounding inflammatory changes. Spleen: Normal in size without focal abnormality. Adrenals/Urinary Tract: The adrenal glands are unremarkable. Mild bilateral renal atrophy. No focal renal lesion. No renal or ureteral calculi. No hydronephrosis. The bladder is unremarkable. Stomach/Bowel: The stomach is markedly distended with focal narrowing of the first portion of the duodenum due to the gallbladder mass. Remaining small bowel is decompressed. The colon is unremarkable. Vascular/Lymphatic: Aortic atherosclerosis. No enlarged abdominal or pelvic lymph nodes. Reproductive: Calcified fibroid.  No adnexal mass. Other: Trace perihepatic and perisplenic free fluid. No pneumoperitoneum. Diastasis of the right lateral  abdominal wall. Musculoskeletal: No acute or significant osseous findings. Degenerative changes of the lumbar spine. IMPRESSION: 1. Heterogeneous, ill-defined gallbladder invading the adjacent liver, consistent with gallbladder carcinoma. Several hepatic metastases measuring up to 2.0 cm. 2. Severe distention of the stomach with gastric outlet obstruction secondary to focal narrowing and invasion of the first portion of the duodenum by the gallbladder mass. 3. Mild central intrahepatic biliary dilatation. Choledocholithiasis with small 3 mm gallstones in the nondilated proximal and distal common bile duct. 4.  Aortic atherosclerosis (ICD10-I70.0). Electronically Signed   By: Titus Dubin M.D.   On: 11/21/2017 20:30    Impression/Plan: - nausea and vomiting. CT abdomen pelvis showed multiple abnormalities  including finding concerning for gallbladder carcinoma with hepatic metastasis as well as possible gastric outlet obstruction from focal narrowing and invasion of gallbladder into the first portion of the duodenum.  - coffee-ground emesis - choledocholithiasis with 3 mm stone into the common bile duct.  - Atrial fibrillation. Last dose of Eliquis 05/03 evening  - acute on chronic anemia  Recommendations ------------------------- - Continue NG suction. - EGD tomorrow for further evaluation.risks, benefits and alternatives discussed with the patient and daughter. They verbalized understanding - May need a liver biopsy - 3 mm common bile duct stone may be incidental finding. She does not have any signs of biliary obstruction or cholangitis at this time. - Appreciate surgery input. Recommend oncology consultation. - keep nothing by mouth for now. GI will follow     LOS: 1 day   Otis Brace  MD, FACP 11/22/2017, 9:43 AM  Contact #  [phone]

## 2017-11-22 NOTE — H&P (View-Only) (Signed)
Referring Provider: ER Primary Care Physician:  Kathyrn Lass, MD Primary Gastroenterologist:  unassigned  Reason for Consultation:  Nausea, vomiting, coffee-ground emesis  HPI: Anna Cuevas is a 77 y.o. female with past medical history of hypertension, diabetes, atrial fibrillation was recently started on Eliquis presented to the hospital with nausea, vomiting and coffee-ground emesis.initial evaluation in the emergency room showed hemoglobin of 7.5. CT abdomen pelvis showed multiple abnormalities including finding concerning for gallbladder carcinoma with hepatic metastasis as well as possible gastric outlet obstruction from focal narrowing and invasion of gallbladder into the first portion of the duodenum. It is Also showed choledocholithiasis with 3 mm stone into the common bile duct. GI is consulted for further evaluation.  Patient seen and examined at bedside in the emergency room. Daughter at bedside.Patient has not been doing well since last January 2019. She was feeling weakness and was fatigued. Had intermittent nausea. Was admitted in March and was diagnosed with atrial fibrillation. Started having frequent episodes of nausea and vomiting 5 days ago.also noted coffee-ground emesis Denies any abdominal pain. Denies any diarrhea or constipation. Denies any blood in the stool.Complaining of decreased appetite. Complaining of abdominal distention.   feeling better after NG tube placement.no previous EGD.   Had colonoscopyin 2011  by Dr. Olevia Perches  which showed small tubular adenoma at 30 cm. Repeat colonoscopy was recommended in 5 years.  Past Medical History:  Diagnosis Date  . Diabetes mellitus without complication ([place])   . Hypertension     Past Surgical History:  Procedure Laterality Date  . CATARACT EXTRACTION, BILATERAL Bilateral     Prior to Admission medications   Medication Sig Start Date End Date Taking? Authorizing Provider  acetaminophen (TYLENOL) 325 MG tablet Take 2  tablets (650 mg total) by mouth every 6 (six) hours as needed for mild pain (or Fever >/= 101). 10/20/17  Yes Emokpae, Courage, MD  apixaban (ELIQUIS) 5 MG TABS tablet Take 1 tablet (5 mg total) by mouth 2 (two) times daily. 10/20/17  Yes Emokpae, Courage, MD  bisacodyl (DULCOLAX) 5 MG EC tablet Take 5 mg by mouth daily as needed for moderate constipation.   Yes [provider]  diltiazem (CARDIZEM CD) 360 MG 24 hr capsule Take 1 capsule (360 mg total) by mouth daily. 10/20/17 10/20/18 Yes Emokpae, Courage, MD  docusate sodium (COLACE) 100 MG capsule Take 200 mg by mouth as needed for mild constipation.   Yes [provider]  loratadine (CLARITIN) 10 MG tablet Take 10 mg by mouth at bedtime.   Yes [provider]  metFORMIN (GLUCOPHAGE) 500 MG tablet Take 1 tablet (500 mg total) by mouth daily. 10/20/17  Yes Emokpae, Courage, MD  metoprolol tartrate (LOPRESSOR) 25 MG tablet Take 1 tablet (25 mg total) by mouth 2 (two) times daily. 10/20/17  Yes Emokpae, Courage, MD  montelukast (SINGULAIR) 10 MG tablet Take 10 mg by mouth at bedtime.   Yes [provider]  ondansetron (ZOFRAN) 4 MG tablet Take 1 tablet (4 mg total) by mouth every 6 (six) hours as needed for nausea. 10/20/17  Yes Emokpae, Courage, MD  pantoprazole (PROTONIX) 40 MG tablet Take 1 tablet (40 mg total) by mouth daily. 10/21/17  Yes Emokpae, Courage, MD  polyethylene glycol (MIRALAX / GLYCOLAX) packet Take 17 g by mouth daily as needed for moderate constipation. 10/20/17  Yes Emokpae, Courage, MD  sertraline (ZOLOFT) 100 MG tablet Take 200 mg by mouth daily. 08/10/17  Yes [provider]    Scheduled Meds: .  insulin aspart  0-5 Units Subcutaneous QHS  . insulin aspart  0-9 Units Subcutaneous TID WC  . metoprolol tartrate  5 mg Intravenous Q8H   Continuous Infusions: . sodium chloride 100 mL/hr (11/22/17 0744)  . pantoprozole (PROTONIX) infusion 8 mg/hr (11/22/17 0827)   PRN Meds:.acetaminophen **OR**  acetaminophen, hydrALAZINE, ondansetron **OR** ondansetron (ZOFRAN) IV  Allergies as of 11/21/2017  . (No Known Allergies)    Family History  Problem Relation Age of Onset  . Stroke Neg Hx     Social History   Socioeconomic History  . Marital status: Widowed    Spouse name: Not on file  . Number of children: Not on file  . Years of education: Not on file  . Highest education level: Not on file  Occupational History  . Not on file  Social Needs  . Financial resource strain: Not on file  . Food insecurity:    Worry: Not on file    Inability: Not on file  . Transportation needs:    Medical: Not on file    Non-medical: Not on file  Tobacco Use  . Smoking status: Never Smoker  . Smokeless tobacco: Never Used  Substance and Sexual Activity  . Alcohol use: Never    Frequency: Never  . Drug use: Never  . Sexual activity: Not on file  Lifestyle  . Physical activity:    Days per week: Not on file    Minutes per session: Not on file  . Stress: Not on file  Relationships  . Social connections:    Talks on phone: Not on file    Gets together: Not on file    Attends religious service: Not on file    Active member of club or organization: Not on file    Attends meetings of clubs or organizations: Not on file    Relationship status: Not on file  . Intimate partner violence:    Fear of current or ex partner: Not on file    Emotionally abused: Not on file    Physically abused: Not on file    Forced sexual activity: Not on file  Other Topics Concern  . Not on file  Social History Narrative  . Not on file    Review of Systems: Review of Systems  Constitutional: Positive for malaise/fatigue. Negative for chills and fever.  HENT: Negative for hearing loss and tinnitus.   Eyes: Negative for blurred vision and double vision.  Respiratory: Negative for cough, hemoptysis and sputum production.   Cardiovascular: Negative for chest pain and palpitations.  Gastrointestinal:  Positive for heartburn, nausea and vomiting. Negative for abdominal pain, blood in stool, constipation and melena.  Genitourinary: Negative for dysuria and urgency.  Musculoskeletal: Positive for myalgias. Negative for neck pain.  Skin: Negative for itching and rash.  Neurological: Negative for seizures and loss of consciousness.  Endo/Heme/Allergies: Does not bruise/bleed easily.  Psychiatric/Behavioral: Negative for hallucinations and suicidal ideas.    Physical Exam: Vital signs: Vitals:   11/22/17 0545 11/22/17 0743  BP: 138/78 (!) 152/75  Pulse: 86 90  Resp: (!) 23 (!) 22  Temp:    SpO2: 95% 99%     Physical Exam  Constitutional: She is oriented to person, place, and time. She appears well-developed and well-nourished. No distress.  HENT:  Head: Normocephalic and atraumatic.  Mouth/Throat: Oropharynx is clear and moist. No oropharyngeal exudate.  NG tube in place  Eyes: EOM are normal. No scleral icterus.  Neck: Normal range of motion. Neck  supple.  Cardiovascular:  IRRR  Pulmonary/Chest: Effort normal and breath sounds normal. No respiratory distress.  Abdominal: Soft. Bowel sounds are normal. She exhibits no distension. There is no tenderness. There is no rebound and no guarding.  Musculoskeletal: Normal range of motion. She exhibits no edema.  Neurological: She is alert and oriented to person, place, and time.  Skin: Skin is warm. No erythema.  Psychiatric: She has a normal mood and affect. Judgment normal.    GI:  Lab Results: Recent Labs    11/21/17 1715 11/21/17 1719 11/21/17 1742 11/22/17 0438  WBC 12.0*  --   --  10.6*  HGB 7.5* 6.8* 7.8* 7.4*  HCT 23.9* 20.0* 23.0* 23.3*  PLT 467*  --   --  341   BMET Recent Labs    11/21/17 1715 11/21/17 1719 11/21/17 1742 11/22/17 0438  NA 132* 135 132* 134*  K 3.8 2.9* 4.1 3.9  CL 94* 105 94* 97*  CO2 24  --   --  25  GLUCOSE 240* 195* 227* 193*  BUN 32* 26* 30* 34*  CREATININE 1.17* 0.70 0.90 1.02*   CALCIUM 8.6*  --   --  8.4*   LFT Recent Labs    11/21/17 1715  PROT 6.1*  ALBUMIN 3.3*  AST 20  ALT 22  ALKPHOS 132*  BILITOT 0.7   PT/INR Recent Labs    11/21/17 1715 11/21/17 2352  LABPROT 16.3* 16.1*  INR 1.33 1.30     Studies/Results: Ct Abdomen Pelvis W Contrast  Result Date: 11/21/2017 CLINICAL DATA:  Hematemesis. EXAM: CT ABDOMEN AND PELVIS WITH CONTRAST TECHNIQUE: Multidetector CT imaging of the abdomen and pelvis was performed using the standard protocol following bolus administration of intravenous contrast. CONTRAST:  42mL ISOVUE-300 IOPAMIDOL (ISOVUE-300) INJECTION 61% COMPARISON:  None. FINDINGS: Lower chest: Mild faint peribronchovascular ground-glass densities at the lung bases may reflect microaspiration. Prominent distention of the distal esophagus. Lipoma of the interatrial septum. Hepatobiliary: Heterogeneous, ill-defined gallbladder invading the adjacent liver. There are several hypoenhancing lesions in the right liver measuring up to 2.0 cm. Cholelithiasis. Mild central intrahepatic biliary dilatation. There are 3 mm gallstones in the proximal and distal common bile duct, which is nondilated. Pancreas: Unremarkable. No pancreatic ductal dilatation or surrounding inflammatory changes. Spleen: Normal in size without focal abnormality. Adrenals/Urinary Tract: The adrenal glands are unremarkable. Mild bilateral renal atrophy. No focal renal lesion. No renal or ureteral calculi. No hydronephrosis. The bladder is unremarkable. Stomach/Bowel: The stomach is markedly distended with focal narrowing of the first portion of the duodenum due to the gallbladder mass. Remaining small bowel is decompressed. The colon is unremarkable. Vascular/Lymphatic: Aortic atherosclerosis. No enlarged abdominal or pelvic lymph nodes. Reproductive: Calcified fibroid.  No adnexal mass. Other: Trace perihepatic and perisplenic free fluid. No pneumoperitoneum. Diastasis of the right lateral  abdominal wall. Musculoskeletal: No acute or significant osseous findings. Degenerative changes of the lumbar spine. IMPRESSION: 1. Heterogeneous, ill-defined gallbladder invading the adjacent liver, consistent with gallbladder carcinoma. Several hepatic metastases measuring up to 2.0 cm. 2. Severe distention of the stomach with gastric outlet obstruction secondary to focal narrowing and invasion of the first portion of the duodenum by the gallbladder mass. 3. Mild central intrahepatic biliary dilatation. Choledocholithiasis with small 3 mm gallstones in the nondilated proximal and distal common bile duct. 4.  Aortic atherosclerosis (ICD10-I70.0). Electronically Signed   By: Titus Dubin M.D.   On: 11/21/2017 20:30    Impression/Plan: - nausea and vomiting. CT abdomen pelvis showed multiple abnormalities  including finding concerning for gallbladder carcinoma with hepatic metastasis as well as possible gastric outlet obstruction from focal narrowing and invasion of gallbladder into the first portion of the duodenum.  - coffee-ground emesis - choledocholithiasis with 3 mm stone into the common bile duct.  - Atrial fibrillation. Last dose of Eliquis 05/03 evening  - acute on chronic anemia  Recommendations ------------------------- - Continue NG suction. - EGD tomorrow for further evaluation.risks, benefits and alternatives discussed with the patient and daughter. They verbalized understanding - May need a liver biopsy - 3 mm common bile duct stone may be incidental finding. She does not have any signs of biliary obstruction or cholangitis at this time. - Appreciate surgery input. Recommend oncology consultation. - keep nothing by mouth for now. GI will follow     LOS: 1 day   Otis Brace  MD, FACP 11/22/2017, 9:43 AM  Contact #  [phone]

## 2017-11-22 NOTE — ED Notes (Signed)
Pt changed from linen after leak with pure wick.

## 2017-11-22 NOTE — Progress Notes (Signed)
PROGRESS NOTE  [name]:[id number] DOB: Oct 25, 1940 DOA: 11/21/2017 PCP: Kathyrn Lass, MD  HPI/Recap of past 24 hours:   HPI: Anna Cuevas is a 77 y.o. female with medical history significant of atrial fibrillation on Eliquis, hypertension, diabetes mellitus, GERD, depression, who presents with hematemesis.  Patient was recently hospitalized 5 weeks ago due to nausea, vomiting and was found to have new onset of atrial fibrillation.  Patient was discharged on Eliquis.  During that admission, patient was found to have anemia and received 1 unit of blood transfusion.  Patient states that she continues to have nausea vomiting after went home. She vomited several times each day.  In the past 3 days, she has intermittently vomited dark-colored blood.    Assessment/Plan: Principal Problem:   GIB (gastrointestinal bleeding) Active Problems:   Atrial fibrillation with rapid ventricular response (HCC)   N&V (nausea and vomiting)   Diabetes mellitus without complication (HCC)   Pressure injury of skin   Depression   Essential hypertension   GERD (gastroesophageal reflux disease)   Gallbladder carcinoma (HCC)   Blood loss anemia   GIB (gastrointestinal bleeding) and blood loss anemia: Hgb dropped from 8.3 to 7.5. Pt continues to have hematemesis in the ED. She has tachycardia. She is currently hemodynamically stable, but at highly risk of deteriorating. Dr. Alessandra Bevels of gastroenterology was consulted by ED physician.   -  off Eliquis - give Kcentra - GI consulted , follow up recommendations - NPO - IVF: 1L NS bolus, then at 1oo mL/hr - Start IV pantoprazole gtt - Zofran IV for nausea - Avoid NSAIDs and SQ heparin - Maintain IV access (2 large bore IVs if possible). - Monitor closely and follow q6h cbc, transfuse as necessary, if Hgb<7.0.  Received 1 unit of blood in ED yesterday - [facility]: INR, PTT and type screen  Gallbladder carcinoma: CT scan showed possible  gallbladder carcinoma, with several hepatic metastases and focal narrowing and invasion of the first portion ofthe duodenum, causing gastric outlet obstruction.  Dr. Redmond Pulling of stent surgeon was consulted.  -NG tube per Dr. Redmond Pulling -pt will need CT of chest to evaluate for lung mets per Dr. Redmond Pulling, since pt just received contrast.  CT scan was held today.NEEDS CT SCAN IN AM -please call oncology in AM -check CEA and CA 19-9 per Dr. Redmond Pulling  Choledocholithiasis: CT showed with small 3 mm gallstones in the nondilated proximal and distal common bile duct. -f/u surgeon's recommendation  Atrial fibrillation with rapid ventricular response ([place]): CHA2DS2-VASc Score is 5, needs oral anticoagulation. Patient is on Eliquis at home. INR is  on admission. Heart rate is 100-110s.  -hold Eliquis -IV metoprolol with holding parameters  N&V (nausea and vomiting): Secondary to metastasized gallbladder carcinoma and gastric outlet obstruction. -NG tube -Symptomatic treatment and IV fluids  Diabetes mellitus without complication ([place]): Last A1c 6.9 on 4/2/9, well controled. Patient is taking metformin at home -SSI  GERD: -Protonix IV  Pressure injury of skin: -wound care consult and seen  Depression: -hold oral med due to NG tube  Essential hypertension: -hold oral meds -IV hydralazine PRN -On IV metoprolol    DVT ppx: SCD Code Status: FUll code Family Communication:   No family at bedside at bed side Disposition Plan:  Anticipate discharge back to previous home environment Consults called:  Dr. Alessandra Bevels of GI and Dr. Redmond Pulling of surgery Admission status: SDU/inpation          Procedures:  NG TUBE   Antimicrobials:  NONE     Objective: Vitals:   11/22/17 1100 11/22/17 1130 11/22/17 1200 11/22/17 1632  BP: (!) 120/106 (!) 135/108 (!) 157/63 (!) 151/66  Pulse: 95 92 90 87  Resp: (!) 21 20  (!) 21  Temp:   98.2 F (36.8 C) 97.9 F (36.6 C)  TempSrc:   Oral Oral    SpO2: 93% 97% 96% 93%  Weight:   81.4 kg (179 lb 7.3 oz)   Height:   5\' 3"  (1.6 m)     Intake/Output Summary (Last 24 hours) at 11/22/2017 1852 Last data filed at 11/22/2017 1814 Gross per 24 hour  Intake 4108.33 ml  Output 1550 ml  Net 2558.33 ml   Filed Weights   11/22/17 1200  Weight: 81.4 kg (179 lb 7.3 oz)   Body mass index is 31.79 kg/m.  Exam:   General: Not in acute distress. Pale looking. HEENT:       Eyes: PERRL, EOMI, no scleral icterus.       ENT: No discharge from the ears and nose, no pharynx injection, no tonsillar enlargement.        Neck: No JVD, no bruit, no mass felt. Heme: No neck lymph node enlargement. Cardiac: K0/X3, RRR, 1/6 systolic murmurs, No gallops or rubs. Respiratory: No rales, wheezing, rhonchi or rubs. GI: Soft, nondistended, nontender, no rebound pain, no organomegaly, BS present. GU: No hematuria Ext: No pitting leg edema bilaterally. 2+DP/PT pulse bilaterally. Musculoskeletal: No joint deformities, No joint redness or warmth, no limitation of ROM in spin. Skin: has pressure ulcer Neuro: Alert, oriented X3, cranial nerves II-XII grossly intact, moves all extremities normally.  Psych: Patient is not psychotic, no suicidal or hemocidal ideation.     Data Reviewed: CBC: Recent Labs  Lab 11/21/17 1715 11/21/17 1719 11/21/17 1742 11/22/17 0438 11/22/17 1310  WBC 12.0*  --   --  10.6* 9.3  NEUTROABS 10.6*  --   --   --   --   HGB 7.5* 6.8* 7.8* 7.4* 7.0*  HCT 23.9* 20.0* 23.0* 23.3* 21.7*  MCV 80.2  --   --  80.9 80.4  PLT 467*  --   --  341 818   Basic Metabolic Panel: Recent Labs  Lab 11/21/17 1715 11/21/17 1719 11/21/17 1742 11/22/17 0438  NA 132* 135 132* 134*  K 3.8 2.9* 4.1 3.9  CL 94* 105 94* 97*  CO2 24  --   --  25  GLUCOSE 240* 195* 227* 193*  BUN 32* 26* 30* 34*  CREATININE 1.17* 0.70 0.90 1.02*  CALCIUM 8.6*  --   --  8.4*   GFR: Estimated Creatinine Clearance: 46.7 mL/min (A) (by C-G formula based on  SCr of 1.02 mg/dL (H)). Liver Function Tests: Recent Labs  Lab 11/21/17 1715  AST 20  ALT 22  ALKPHOS 132*  BILITOT 0.7  PROT 6.1*  ALBUMIN 3.3*   Recent Labs  Lab 11/21/17 1715  LIPASE 52*   No results for input(s): AMMONIA in the last 168 hours. Coagulation Profile: Recent Labs  Lab 11/21/17 1715 11/21/17 2352  INR 1.33 1.30   Cardiac Enzymes: No results for input(s): CKTOTAL, CKMB, CKMBINDEX, TROPONINI in the last 168 hours. BNP (last 3 results) No results for input(s): PROBNP in the last 8760 hours. HbA1C: No results for input(s): HGBA1C in the last 72 hours. CBG: Recent Labs  Lab 11/22/17 0111 11/22/17 0818 11/22/17 1645  GLUCAP 236* 155* 146*   Lipid Profile: No results for input(s): CHOL, HDL,  LDLCALC, TRIG, CHOLHDL, LDLDIRECT in the last 72 hours. Thyroid Function Tests: No results for input(s): TSH, T4TOTAL, FREET4, T3FREE, THYROIDAB in the last 72 hours. Anemia Panel: No results for input(s): VITAMINB12, FOLATE, FERRITIN, TIBC, IRON, RETICCTPCT in the last 72 hours. Urine analysis:    Component Value Date/Time   COLORURINE YELLOW 11/22/2017 1130   APPEARANCEUR HAZY (A) 11/22/2017 1130   LABSPEC >1.046 (H) 11/22/2017 1130   PHURINE 5.0 11/22/2017 1130   GLUCOSEU NEGATIVE 11/22/2017 1130   HGBUR NEGATIVE 11/22/2017 1130   BILIRUBINUR NEGATIVE 11/22/2017 1130   KETONESUR 5 (A) 11/22/2017 1130   PROTEINUR NEGATIVE 11/22/2017 1130   NITRITE NEGATIVE 11/22/2017 1130   LEUKOCYTESUR LARGE (A) 11/22/2017 1130   Sepsis Labs: @LABRCNTIP (procalcitonin:4,lacticidven:4)  ) Recent Results (from the past 240 hour(s))  MRSA PCR Screening     Status: None   Collection Time: 11/22/17  1:44 PM  Result Value Ref Range Status   MRSA by PCR NEGATIVE NEGATIVE Final    Comment:        The GeneXpert MRSA Assay (FDA approved for NASAL specimens only), is one component of a comprehensive MRSA colonization surveillance program. It is not intended to diagnose  MRSA infection nor to guide or monitor treatment for MRSA infections. Performed at [facility], [place] [address] [place], [place] [address]       Studies: Ct Abdomen Pelvis W Contrast  Result Date: 11/21/2017 CLINICAL DATA:  Hematemesis. EXAM: CT ABDOMEN AND PELVIS WITH CONTRAST TECHNIQUE: Multidetector CT imaging of the abdomen and pelvis was performed using the standard protocol following bolus administration of intravenous contrast. CONTRAST:  52mL ISOVUE-300 IOPAMIDOL (ISOVUE-300) INJECTION 61% COMPARISON:  None. FINDINGS: Lower chest: Mild faint peribronchovascular ground-glass densities at the lung bases may reflect microaspiration. Prominent distention of the distal esophagus. Lipoma of the interatrial septum. Hepatobiliary: Heterogeneous, ill-defined gallbladder invading the adjacent liver. There are several hypoenhancing lesions in the right liver measuring up to 2.0 cm. Cholelithiasis. Mild central intrahepatic biliary dilatation. There are 3 mm gallstones in the proximal and distal common bile duct, which is nondilated. Pancreas: Unremarkable. No pancreatic ductal dilatation or surrounding inflammatory changes. Spleen: Normal in size without focal abnormality. Adrenals/Urinary Tract: The adrenal glands are unremarkable. Mild bilateral renal atrophy. No focal renal lesion. No renal or ureteral calculi. No hydronephrosis. The bladder is unremarkable. Stomach/Bowel: The stomach is markedly distended with focal narrowing of the first portion of the duodenum due to the gallbladder mass. Remaining small bowel is decompressed. The colon is unremarkable. Vascular/Lymphatic: Aortic atherosclerosis. No enlarged abdominal or pelvic lymph nodes. Reproductive: Calcified fibroid.  No adnexal mass. Other: Trace perihepatic and perisplenic free fluid. No pneumoperitoneum. Diastasis of the right lateral abdominal wall. Musculoskeletal: No acute or significant osseous findings. Degenerative changes of the  lumbar spine. IMPRESSION: 1. Heterogeneous, ill-defined gallbladder invading the adjacent liver, consistent with gallbladder carcinoma. Several hepatic metastases measuring up to 2.0 cm. 2. Severe distention of the stomach with gastric outlet obstruction secondary to focal narrowing and invasion of the first portion of the duodenum by the gallbladder mass. 3. Mild central intrahepatic biliary dilatation. Choledocholithiasis with small 3 mm gallstones in the nondilated proximal and distal common bile duct. 4.  Aortic atherosclerosis (ICD10-I70.0). Electronically Signed   By: Titus Dubin M.D.   On: 11/21/2017 20:30    Scheduled Meds: . insulin aspart  0-5 Units Subcutaneous QHS  . insulin aspart  0-9 Units Subcutaneous TID WC  . metoprolol tartrate  5 mg Intravenous Q8H  Continuous Infusions: . sodium chloride 100 mL/hr at 11/22/17 1521  . pantoprozole (PROTONIX) infusion 8 mg/hr (11/22/17 0827)     LOS: 1 day     Cristal Deer, MD [facility]  To reach me or the doctor on call, go to: [email or web address] Password [facility]  11/22/2017, 6:52 PM

## 2017-11-22 NOTE — ED Notes (Signed)
Lab drawing labs

## 2017-11-23 ENCOUNTER — Encounter (HOSPITAL_COMMUNITY): Payer: Self-pay | Admitting: Certified Registered Nurse Anesthetist

## 2017-11-23 ENCOUNTER — Inpatient Hospital Stay (HOSPITAL_COMMUNITY): Payer: PPO | Admitting: Anesthesiology

## 2017-11-23 ENCOUNTER — Encounter (HOSPITAL_COMMUNITY)
Admission: EM | Disposition: A | Discharge status: Home / Self Care | Payer: Self-pay | Source: Home / Self Care | Attending: Internal Medicine

## 2017-11-23 ENCOUNTER — Inpatient Hospital Stay (HOSPITAL_COMMUNITY): Payer: PPO

## 2017-11-23 DIAGNOSIS — D500 Iron deficiency anemia secondary to blood loss (chronic): Secondary | ICD-10-CM

## 2017-11-23 HISTORY — PX: ESOPHAGOGASTRODUODENOSCOPY (EGD) WITH PROPOFOL: SHX5813

## 2017-11-23 LAB — BASIC METABOLIC PANEL
Anion gap: 11 (ref 5–15)
BUN: 21 mg/dL — ABNORMAL HIGH (ref 6–20)
CO2: 25 mmol/L (ref 22–32)
Calcium: 8 mg/dL — ABNORMAL LOW (ref 8.9–10.3)
Chloride: 103 mmol/L (ref 101–111)
Creatinine, Ser: 0.76 mg/dL (ref 0.44–1.00)
GFR calc Af Amer: >60 mL/min (ref >60)
GFR calc non Af Amer: >60 mL/min (ref >60)
Glucose, Bld: 133 mg/dL — ABNORMAL HIGH (ref 65–99)
Potassium: 3.2 mmol/L — ABNORMAL LOW (ref 3.5–5.1)
Sodium: 139 mmol/L (ref 135–145)

## 2017-11-23 LAB — CBC
HCT: 23.4 % — ABNORMAL LOW (ref 36.0–46.0)
Hemoglobin: 7.5 g/dL — ABNORMAL LOW (ref 12.0–15.0)
MCH: 26.2 pg (ref 26.0–34.0)
MCHC: 32.1 g/dL (ref 30.0–36.0)
MCV: 81.8 fL (ref 78.0–100.0)
Platelets: 245 10*3/uL (ref 150–400)
RBC: 2.86 MIL/uL — ABNORMAL LOW (ref 3.87–5.11)
RDW: 14.6 % (ref 11.5–15.5)
WBC: 7.6 10*3/uL (ref 4.0–10.5)

## 2017-11-23 LAB — TYPE AND SCREEN
ABO/RH(D): O POS
Antibody Screen: NEGATIVE
UNIT DIVISION: 0
UNIT DIVISION: 0

## 2017-11-23 LAB — BPAM RBC
BLOOD PRODUCT EXPIRATION DATE: 201905272359
Blood Product Expiration Date: 201905282359
ISSUE DATE / TIME: 201905050112
ISSUE DATE / TIME: 201905052000
Unit Type and Rh: 5100
Unit Type and Rh: 5100

## 2017-11-23 LAB — GLUCOSE, CAPILLARY
GLUCOSE-CAPILLARY: 129 mg/dL — AB (ref 65–99)
GLUCOSE-CAPILLARY: 145 mg/dL — AB (ref 65–99)
Glucose-Capillary: 114 mg/dL — ABNORMAL HIGH (ref 65–99)
Glucose-Capillary: 111 mg/dL — ABNORMAL HIGH (ref 65–99)

## 2017-11-23 LAB — CEA: CEA: 20.6 ng/mL — ABNORMAL HIGH (ref 0.0–4.7)

## 2017-11-23 LAB — CANCER ANTIGEN 19-9: CAN 19-9: 314 U/mL — AB (ref 0–35)

## 2017-11-23 SURGERY — ESOPHAGOGASTRODUODENOSCOPY (EGD) WITH PROPOFOL
Anesthesia: General

## 2017-11-23 MED ORDER — ORAL CARE MOUTH RINSE
15.0000 mL | Freq: Two times a day (BID) | OROMUCOSAL | Status: DC
  Administered 2017-11-23 – 2017-12-01 (×15): 15 mL via OROMUCOSAL

## 2017-11-23 MED ORDER — PHENYLEPHRINE 40 MCG/ML (10ML) SYRINGE FOR IV PUSH (FOR BLOOD PRESSURE SUPPORT)
PREFILLED_SYRINGE | INTRAVENOUS | Status: DC | PRN
  Administered 2017-11-23: 80 ug via INTRAVENOUS

## 2017-11-23 MED ORDER — SUCCINYLCHOLINE CHLORIDE 200 MG/10ML IV SOSY
PREFILLED_SYRINGE | INTRAVENOUS | Status: DC | PRN
  Administered 2017-11-23: 100 mg via INTRAVENOUS

## 2017-11-23 MED ORDER — DEXAMETHASONE SODIUM PHOSPHATE 10 MG/ML IJ SOLN
INTRAMUSCULAR | Status: DC | PRN
  Administered 2017-11-23: 5 mg via INTRAVENOUS

## 2017-11-23 MED ORDER — ONDANSETRON HCL 4 MG/2ML IJ SOLN
INTRAMUSCULAR | Status: DC | PRN
  Administered 2017-11-23: 4 mg via INTRAVENOUS

## 2017-11-23 MED ORDER — POTASSIUM CHLORIDE 10 MEQ/100ML IV SOLN
10.0000 meq | INTRAVENOUS | Status: AC
  Administered 2017-11-23 (×3): 10 meq via INTRAVENOUS
  Filled 2017-11-23 (×3): qty 100

## 2017-11-23 MED ORDER — METOPROLOL TARTRATE 5 MG/5ML IV SOLN
5.0000 mg | Freq: Four times a day (QID) | INTRAVENOUS | Status: DC
  Administered 2017-11-23 – 2017-12-02 (×35): 5 mg via INTRAVENOUS
  Filled 2017-11-23 (×35): qty 5

## 2017-11-23 MED ORDER — LIDOCAINE HCL (CARDIAC) PF 100 MG/5ML IV SOSY
PREFILLED_SYRINGE | INTRAVENOUS | Status: DC | PRN
  Administered 2017-11-23: 80 mg via INTRAVENOUS

## 2017-11-23 MED ORDER — PROPOFOL 10 MG/ML IV BOLUS
INTRAVENOUS | Status: DC | PRN
  Administered 2017-11-23: 110 mg via INTRAVENOUS

## 2017-11-23 SURGICAL SUPPLY — 15 items

## 2017-11-23 NOTE — Consult Note (Addendum)
[place] Nurse wound consult note Reason for Consult: Consult requested for buttocks.  Pt states they have been present prior to admission and have been improving. Wound type: Buttock with stage 2 pressure injury; .5X.5X.1cm, dry red wound bed, no odor or drainage. Other buttock with dry scabbed area where previous wound has healed. Pt has Purewick in place to attempt to contain urine.  Pressure Injury POA: Yes Dressing procedure/placement/frequency: Foam dressing to promote drying and healing to buttock wound.  Discussed plan of care with patient and family member at the bedside. Please re-consult if further assistance is needed.  Thank-you,  Julien Girt MSN, [place], [place], [place], [place]

## 2017-11-23 NOTE — Op Note (Signed)
[facility] Patient Name: Anna Cuevas Procedure Date : 11/23/2017 MRN: [id number] Attending MD: Otis Brace , MD Date of Birth: [birth year] CSN: [id number] Age: 77 Admit Type: Inpatient Procedure:                Upper GI endoscopy Indications:              Abnormal CT of the GI tract Providers:                Otis Brace, MD, Zenon Mayo, RN, Tinnie Gens, Technician, Dellie Catholic, CRNA Referring MD:              Medicines:                Sedation Administered by an Anesthesia Professional Complications:            No immediate complications. Estimated Blood Loss:     Estimated blood loss: none. Procedure:                Pre-Anesthesia Assessment:                           - Prior to the procedure, a History and Physical                            was performed, and patient medications and                            allergies were reviewed. The patient's tolerance of                            previous anesthesia was also reviewed. The risks                            and benefits of the procedure and the sedation                            options and risks were discussed with the patient.                            All questions were answered, and informed consent                            was obtained. Prior Anticoagulants: The patient has                            taken Eliquis (apixaban), last dose was 4 days                            prior to procedure. ASA Grade Assessment: IV - A                            patient with severe systemic disease that is a  constant threat to life. After reviewing the risks                            and benefits, the patient was deemed in                            satisfactory condition to undergo the procedure.                           After obtaining informed consent, the endoscope was                            passed under direct vision. Throughout the                             procedure, the patient's blood pressure, pulse, and                            oxygen saturations were monitored continuously. The                            [id number] ([id number]) scope was introduced through the                            mouth, and advanced to the duodenal bulb. The upper                            GI endoscopy was technically difficult and complex                            due to stenosis. The patient tolerated the                            procedure well. Scope In: Scope Out: Findings:      visualization of esophagus was difficult because of liquid food residue       coating esophageal mucosa.      A large amount of food (residue) was found in the entire examined       stomach.      An acquired likely malignant severe stenosis was found in the duodenal       bulb and was non-traversed. Biopsies were taken with a cold forceps for       histology.      - NG tube was reinserted after the procedure. Placement in the stomach       was documented by endoscope. Impression:               - A large amount of food (residue) in the stomach.                           - Acquired duodenal stenosis. Biopsied. Recommendation:           - Return patient to hospital ward for ongoing care.                           -  NPO.                           - Continue present medications.                           - Await pathology results.                           - recommend interventional radiology consult for                            liver biopsy. Procedure Code(s):        --- Professional ---                           [phone], Esophagogastroduodenoscopy, flexible,                            transoral; with biopsy, single or multiple Diagnosis Code(s):        --- Professional ---                           K31.5, Obstruction of duodenum                           R93.3, Abnormal findings on diagnostic imaging of                            other parts of digestive  tract CPT copyright 2017 American Medical Association. All rights reserved. The codes documented in this report are preliminary and upon coder review may  be revised to meet current compliance requirements. Otis Brace, MD Otis Brace, MD 11/23/2017 12:25:49 PM Number of Addenda: 0

## 2017-11-23 NOTE — Progress Notes (Addendum)
Patient ID: Anna Cuevas, female   DOB: 08/08/1940, 77 y.o.   MRN: [id number]       Subjective: Pt complains of sore throat.  Denies abdominal pain.  Is nervous about what treatments/procedures she may need.  Objective: Vital signs in last 24 hours: Temp:  [97.9 F (36.6 C)-98.7 F (37.1 C)] 98.2 F (36.8 C) (05/06 0700) Pulse Rate:  [81-98] 82 (05/06 0700) Resp:  [17-24] 18 (05/06 0700) BP: (120-162)/(37-113) 135/113 (05/06 0700) SpO2:  [90 %-98 %] 96 % (05/06 0700) Weight:  [81.4 kg (179 lb 7.3 oz)] 81.4 kg (179 lb 7.3 oz) (05/05 1200) Last BM Date: 11/21/17  Intake/Output from previous day: 05/05 0701 - 05/06 0700 In: 4692.5 [P.O.:120; I.V.:4027.5; Blood:315; NG/GT:30; IV Piggyback:200] Out: 1990 [Urine:400; Emesis/NG NLGXQJ:1941] Intake/Output this shift: No intake/output data recorded.  PE: Heart: regular Lungs: CTAB Abd: soft, NT, ND, obese, +BS, NGT in place with bilious output. 1240cc/24h  Lab Results:  Recent Labs    11/22/17 1754 11/23/17 0230  WBC 8.4 7.6  HGB 6.6* 7.5*  HCT 20.6* 23.4*  PLT 294 245   BMET Recent Labs    11/22/17 0438 11/23/17 0230  NA 134* 139  K 3.9 3.2*  CL 97* 103  CO2 25 25  GLUCOSE 193* 133*  BUN 34* 21*  CREATININE 1.02* 0.76  CALCIUM 8.4* 8.0*   PT/INR Recent Labs    11/21/17 1715 11/21/17 2352  LABPROT 16.3* 16.1*  INR 1.33 1.30   CMP     Component Value Date/Time   NA 139 11/23/2017 0230   K 3.2 (L) 11/23/2017 0230   CL 103 11/23/2017 0230   CO2 25 11/23/2017 0230   GLUCOSE 133 (H) 11/23/2017 0230   BUN 21 (H) 11/23/2017 0230   CREATININE 0.76 11/23/2017 0230   CALCIUM 8.0 (L) 11/23/2017 0230   PROT 6.1 (L) 11/21/2017 1715   ALBUMIN 3.3 (L) 11/21/2017 1715   AST 20 11/21/2017 1715   ALT 22 11/21/2017 1715   ALKPHOS 132 (H) 11/21/2017 1715   BILITOT 0.7 11/21/2017 1715   GFRNONAA >60 11/23/2017 0230   GFRAA >60 11/23/2017 0230   Lipase     Component Value Date/Time   LIPASE 52 (H) 11/21/2017  1715       Studies/Results: Ct Abdomen Pelvis W Contrast  Result Date: 11/21/2017 CLINICAL DATA:  Hematemesis. EXAM: CT ABDOMEN AND PELVIS WITH CONTRAST TECHNIQUE: Multidetector CT imaging of the abdomen and pelvis was performed using the standard protocol following bolus administration of intravenous contrast. CONTRAST:  30mL ISOVUE-300 IOPAMIDOL (ISOVUE-300) INJECTION 61% COMPARISON:  None. FINDINGS: Lower chest: Mild faint peribronchovascular ground-glass densities at the lung bases may reflect microaspiration. Prominent distention of the distal esophagus. Lipoma of the interatrial septum. Hepatobiliary: Heterogeneous, ill-defined gallbladder invading the adjacent liver. There are several hypoenhancing lesions in the right liver measuring up to 2.0 cm. Cholelithiasis. Mild central intrahepatic biliary dilatation. There are 3 mm gallstones in the proximal and distal common bile duct, which is nondilated. Pancreas: Unremarkable. No pancreatic ductal dilatation or surrounding inflammatory changes. Spleen: Normal in size without focal abnormality. Adrenals/Urinary Tract: The adrenal glands are unremarkable. Mild bilateral renal atrophy. No focal renal lesion. No renal or ureteral calculi. No hydronephrosis. The bladder is unremarkable. Stomach/Bowel: The stomach is markedly distended with focal narrowing of the first portion of the duodenum due to the gallbladder mass. Remaining small bowel is decompressed. The colon is unremarkable. Vascular/Lymphatic: Aortic atherosclerosis. No enlarged abdominal or pelvic lymph nodes. Reproductive: Calcified fibroid.  No adnexal mass. Other: Trace perihepatic and perisplenic free fluid. No pneumoperitoneum. Diastasis of the right lateral abdominal wall. Musculoskeletal: No acute or significant osseous findings. Degenerative changes of the lumbar spine. IMPRESSION: 1. Heterogeneous, ill-defined gallbladder invading the adjacent liver, consistent with gallbladder carcinoma.  Several hepatic metastases measuring up to 2.0 cm. 2. Severe distention of the stomach with gastric outlet obstruction secondary to focal narrowing and invasion of the first portion of the duodenum by the gallbladder mass. 3. Mild central intrahepatic biliary dilatation. Choledocholithiasis with small 3 mm gallstones in the nondilated proximal and distal common bile duct. 4.  Aortic atherosclerosis (ICD10-I70.0). Electronically Signed   By: Titus Dubin M.D.   On: 11/21/2017 20:30    Anti-infectives: Anti-infectives (From admission, onward)   None       Assessment/Plan Gastric outlet obstruction, likely secondary to abnormal gallbladder with invasion of liver and duodenum -GI evaluation.  Will pursue EGD today with hopeful biopsy.  If unable to biopsy will likely need IR to biopsy liver lesion to determine type of malignancy.  This will determine what type or if the patient will need surgical intervention, palliative g-tube vs palliative GJ bypass. -cont NGT for decompression -cont to hold eliquis -CEA and CA19-9 are pending -needs CT of chest for full staging working, will order -onc consult -will follow  Right hepatic liver masses x 3 Acute on chronic anemia Anticoagulated on eliquis -on hold DM HTN Choledocholithiasis -no biliary symptoms currently, may be incidental find per GI.  Plans for EGD today. GI bleed  FEN - NPO VTE - SCDs ID - none currently   LOS: 2 days    Henreitta Cea , [facility] 11/23/2017, 8:19 AM Pager: [phone]

## 2017-11-23 NOTE — Anesthesia Postprocedure Evaluation (Signed)
Anesthesia Post Note  Patient: Anna Cuevas  Procedure(s) Performed: ESOPHAGOGASTRODUODENOSCOPY (EGD) WITH PROPOFOL (N/A )     Patient location during evaluation: PACU Anesthesia Type: General Level of consciousness: awake and alert Pain management: pain level controlled Vital Signs Assessment: post-procedure vital signs reviewed and stable Respiratory status: spontaneous breathing, nonlabored ventilation, respiratory function stable and patient connected to nasal cannula oxygen Cardiovascular status: blood pressure returned to baseline and stable Postop Assessment: no apparent nausea or vomiting Anesthetic complications: no    Last Vitals:  Vitals:   11/23/17 1223 11/23/17 1230  BP: (!) 170/59 (!) 176/51  Pulse: 88 88  Resp: (!) 22 17  Temp: 36.8 C   SpO2: 100% 100%    Last Pain:  Vitals:   11/23/17 1230  TempSrc:   PainSc: 0-No pain                 Hershall Benkert S

## 2017-11-23 NOTE — Progress Notes (Signed)
Patient off floor to Endo.   

## 2017-11-23 NOTE — Anesthesia Procedure Notes (Signed)
Procedure Name: Intubation Date/Time: 11/23/2017 11:46 AM Performed by: Candis Shine, CRNA Pre-anesthesia Checklist: Patient identified, Emergency Drugs available, Suction available and Patient being monitored Patient Re-evaluated:Patient Re-evaluated prior to induction Oxygen Delivery Method: Circle System Utilized Preoxygenation: Pre-oxygenation with 100% oxygen Induction Type: IV induction, Rapid sequence and Cricoid Pressure applied Laryngoscope Size: Mac and 3 Grade View: Grade I Tube type: Oral Tube size: 7.0 mm Number of attempts: 1 Airway Equipment and Method: Stylet Placement Confirmation: ETT inserted through vocal cords under direct vision,  positive ETCO2 and breath sounds checked- equal and bilateral Secured at: 22 cm Tube secured with: Tape Dental Injury: Teeth and Oropharynx as per pre-operative assessment

## 2017-11-23 NOTE — Brief Op Note (Addendum)
11/21/2017 - 11/23/2017  12:26 PM  PATIENT:  Anna Cuevas  77 y.o. female  PRE-OPERATIVE DIAGNOSIS:  coffee-ground emesis, gastric outlet obstruction  POST-OPERATIVE DIAGNOSIS:  gastric outlet obstruction; retained food in stomach; NG tube replaced  PROCEDURE:  Procedure(s): ESOPHAGOGASTRODUODENOSCOPY (EGD) WITH PROPOFOL (N/A)  SURGEON:  Surgeon(s) and Role:    * Latanza Pfefferkorn, MD - Primary  Findings ---------- - EGD showed severe stenosis at duodenal bulb with possible infiltrative/malignant lesion. Biopsies performed. - Large amount of retained food in the stomach.  Recommendations ------------------------ - Continue intermittent NG suction. Keep nothing by mouth.  - Follow path results. - Recommend interventional radiology consult for biopsy of the liver lesions - GI will follow  Otis Brace MD, FACP 11/23/2017, 12:27 PM  Contact #  [phone]

## 2017-11-23 NOTE — Consult Note (Signed)
Chief Complaint: Patient was seen in consultation today for liver lesion  Referring Physician(s): Dr. Alessandra Bevels  Supervising Physician: Sandi Mariscal  Patient Status: [facility] - In-pt  History of Present Illness: Anna Cuevas is a 77 y.o. female with past medical history of HTN, DM, and a fib who presented to [facility] ED with nausea, vomiting, and coffee-ground emesis.   CT Abdomen Pelvis showed: 1. Heterogeneous, ill-defined gallbladder invading the adjacent liver, consistent with gallbladder carcinoma. Several hepatic metastases measuring up to 2.0 cm. 2. Severe distention of the stomach with gastric outlet obstruction secondary to focal narrowing and invasion of the first portion of the duodenum by the gallbladder mass. 3. Mild central intrahepatic biliary dilatation. Choledocholithiasis with small 3 mm gallstones in the nondilated proximal and distal common bile duct.  She was evaluated by GI who took her for EGD today.  Patient with severe abdominal distention due to gastric outlet obstruction as well as duodenal bulb stenosis which was non-traversed.  Samples were obtained, however recommendation for image-guided liver lesion biopsy was made.   Case reviewed by Dr. Pascal Lux who has approved patient for procedure.   Past Medical History:  Diagnosis Date  . Diabetes mellitus without complication ([place])   . Hypertension     Past Surgical History:  Procedure Laterality Date  . CATARACT EXTRACTION, BILATERAL Bilateral     Allergies: Patient has no known allergies.  Medications: Prior to Admission medications   Medication Sig Start Date End Date Taking? Authorizing Provider  acetaminophen (TYLENOL) 325 MG tablet Take 2 tablets (650 mg total) by mouth every 6 (six) hours as needed for mild pain (or Fever >/= 101). 10/20/17  Yes Emokpae, Courage, MD  apixaban (ELIQUIS) 5 MG TABS tablet Take 1 tablet (5 mg total) by mouth 2 (two) times daily. 10/20/17  Yes Emokpae, Courage, MD    bisacodyl (DULCOLAX) 5 MG EC tablet Take 5 mg by mouth daily as needed for moderate constipation.   Yes [provider]  diltiazem (CARDIZEM CD) 360 MG 24 hr capsule Take 1 capsule (360 mg total) by mouth daily. 10/20/17 10/20/18 Yes Emokpae, Courage, MD  docusate sodium (COLACE) 100 MG capsule Take 200 mg by mouth as needed for mild constipation.   Yes [provider]  loratadine (CLARITIN) 10 MG tablet Take 10 mg by mouth at bedtime.   Yes [provider]  metFORMIN (GLUCOPHAGE) 500 MG tablet Take 1 tablet (500 mg total) by mouth daily. 10/20/17  Yes Emokpae, Courage, MD  metoprolol tartrate (LOPRESSOR) 25 MG tablet Take 1 tablet (25 mg total) by mouth 2 (two) times daily. 10/20/17  Yes Emokpae, Courage, MD  montelukast (SINGULAIR) 10 MG tablet Take 10 mg by mouth at bedtime.   Yes [provider]  ondansetron (ZOFRAN) 4 MG tablet Take 1 tablet (4 mg total) by mouth every 6 (six) hours as needed for nausea. 10/20/17  Yes Emokpae, Courage, MD  pantoprazole (PROTONIX) 40 MG tablet Take 1 tablet (40 mg total) by mouth daily. 10/21/17  Yes Emokpae, Courage, MD  polyethylene glycol (MIRALAX / GLYCOLAX) packet Take 17 g by mouth daily as needed for moderate constipation. 10/20/17  Yes Emokpae, Courage, MD  sertraline (ZOLOFT) 100 MG tablet Take 200 mg by mouth daily. 08/10/17  Yes [provider]     Family History  Problem Relation Age of Onset  . Stroke Neg Hx     Social History   Socioeconomic History  . Marital status: Widowed    Spouse name:  Not on file  . Number of children: Not on file  . Years of education: Not on file  . Highest education level: Not on file  Occupational History  . Not on file  Social Needs  . Financial resource strain: Not on file  . Food insecurity:    Worry: Not on file    Inability: Not on file  . Transportation needs:    Medical: Not on file    Non-medical: Not on file  Tobacco Use  . Smoking status: Never Smoker  .  Smokeless tobacco: Never Used  Substance and Sexual Activity  . Alcohol use: Never    Frequency: Never  . Drug use: Never  . Sexual activity: Not on file  Lifestyle  . Physical activity:    Days per week: Not on file    Minutes per session: Not on file  . Stress: Not on file  Relationships  . Social connections:    Talks on phone: Not on file    Gets together: Not on file    Attends religious service: Not on file    Active member of club or organization: Not on file    Attends meetings of clubs or organizations: Not on file    Relationship status: Not on file  Other Topics Concern  . Not on file  Social History Narrative  . Not on file     Review of Systems: A 12 point ROS discussed and pertinent positives are indicated in the HPI above.  All other systems are negative.  Review of Systems  Constitutional: Positive for fatigue. Negative for fever.  Respiratory: Negative for cough and shortness of breath.   Cardiovascular: Negative for chest pain.  Gastrointestinal: Positive for abdominal pain, nausea and vomiting.  Psychiatric/Behavioral: Negative for behavioral problems and confusion.    Vital Signs: BP (!) 156/58 (BP Location: Left Arm)   Pulse 81   Temp 98 F (36.7 C) (Oral)   Resp (!) 21   Ht 5\' 3"  (1.6 m)   Wt 179 lb (81.2 kg)   SpO2 98%   BMI 31.71 kg/m   Physical Exam  Constitutional: She appears well-developed.  Cardiovascular: Normal rate, regular rhythm and normal heart sounds.  Pulmonary/Chest: Effort normal and breath sounds normal. No respiratory distress.  Abdominal: Soft. She exhibits no distension. There is no tenderness.  Skin: Skin is warm and dry.  Psychiatric: She has a normal mood and affect. Her behavior is normal. Judgment and thought content normal.  Nursing note and vitals reviewed.    MD Evaluation Airway: WNL Heart: WNL Abdomen: WNL Chest/ Lungs: WNL ASA  Classification: 3 Mallampati/Airway Score: One   Imaging: Ct Abdomen  Pelvis W Contrast  Result Date: 11/21/2017 CLINICAL DATA:  Hematemesis. EXAM: CT ABDOMEN AND PELVIS WITH CONTRAST TECHNIQUE: Multidetector CT imaging of the abdomen and pelvis was performed using the standard protocol following bolus administration of intravenous contrast. CONTRAST:  52mL ISOVUE-300 IOPAMIDOL (ISOVUE-300) INJECTION 61% COMPARISON:  None. FINDINGS: Lower chest: Mild faint peribronchovascular ground-glass densities at the lung bases may reflect microaspiration. Prominent distention of the distal esophagus. Lipoma of the interatrial septum. Hepatobiliary: Heterogeneous, ill-defined gallbladder invading the adjacent liver. There are several hypoenhancing lesions in the right liver measuring up to 2.0 cm. Cholelithiasis. Mild central intrahepatic biliary dilatation. There are 3 mm gallstones in the proximal and distal common bile duct, which is nondilated. Pancreas: Unremarkable. No pancreatic ductal dilatation or surrounding inflammatory changes. Spleen: Normal in size without focal abnormality. Adrenals/Urinary Tract: The  adrenal glands are unremarkable. Mild bilateral renal atrophy. No focal renal lesion. No renal or ureteral calculi. No hydronephrosis. The bladder is unremarkable. Stomach/Bowel: The stomach is markedly distended with focal narrowing of the first portion of the duodenum due to the gallbladder mass. Remaining small bowel is decompressed. The colon is unremarkable. Vascular/Lymphatic: Aortic atherosclerosis. No enlarged abdominal or pelvic lymph nodes. Reproductive: Calcified fibroid.  No adnexal mass. Other: Trace perihepatic and perisplenic free fluid. No pneumoperitoneum. Diastasis of the right lateral abdominal wall. Musculoskeletal: No acute or significant osseous findings. Degenerative changes of the lumbar spine. IMPRESSION: 1. Heterogeneous, ill-defined gallbladder invading the adjacent liver, consistent with gallbladder carcinoma. Several hepatic metastases measuring up to 2.0  cm. 2. Severe distention of the stomach with gastric outlet obstruction secondary to focal narrowing and invasion of the first portion of the duodenum by the gallbladder mass. 3. Mild central intrahepatic biliary dilatation. Choledocholithiasis with small 3 mm gallstones in the nondilated proximal and distal common bile duct. 4.  Aortic atherosclerosis (ICD10-I70.0). Electronically Signed   By: Titus Dubin M.D.   On: 11/21/2017 20:30    Labs:  CBC: Recent Labs    11/22/17 0438 11/22/17 1310 11/22/17 1754 11/23/17 0230  WBC 10.6* 9.3 8.4 7.6  HGB 7.4* 7.0* 6.6* 7.5*  HCT 23.3* 21.7* 20.6* 23.4*  PLT 341 333 294 245    COAGS: Recent Labs    10/15/17 1110 11/21/17 1715 11/21/17 2352  INR 1.04 1.33 1.30  APTT  --   --  32    BMP: Recent Labs    10/20/17 0224 11/21/17 1715 11/21/17 1719 11/21/17 1742 11/22/17 0438 11/23/17 0230  NA 128* 132* 135 132* 134* 139  K 4.4 3.8 2.9* 4.1 3.9 3.2*  CL 91* 94* 105 94* 97* 103  CO2 25 24  --   --  25 25  GLUCOSE 207* 240* 195* 227* 193* 133*  BUN 33* 32* 26* 30* 34* 21*  CALCIUM 8.4* 8.6*  --   --  8.4* 8.0*  CREATININE 0.84 1.17* 0.70 0.90 1.02* 0.76  GFRNONAA >60 44*  --   --  52* >60  GFRAA >60 51*  --   --  60* >60    LIVER FUNCTION TESTS: Recent Labs    10/15/17 1110 11/21/17 1715  BILITOT 0.5 0.7  AST 32 20  ALT 38 22  ALKPHOS 133* 132*  PROT 6.1* 6.1*  ALBUMIN 2.9* 3.3*    TUMOR MARKERS: No results for input(s): AFPTM, CEA, CA199, CHROMGRNA in the last 8760 hours.  Assessment and Plan: Liver lesion Gastric outlet obstruction, duodenal obstruction due to mass invasion from gallbladder Patient s/p EGD today.  Request now made for liver lesion biopsy.  Patient assessed, however due to receiving sedation medication recently, she cannot consent.   Will plan for procedure tomorrow and will work towards obtained consent once medication cleared from her system.  Have made her NPO after midnight.  Recheck  INR; was normal at 1.3 2 days ago.  Thank you for this interesting consult.  I greatly enjoyed meeting Anna Cuevas and look forward to participating in their care.  A copy of this report was sent to the requesting provider on this date.  Electronically Signed: Docia Barrier, PA 11/23/2017, 5:03 PM   I spent a total of 40 Minutes    in face to face in clinical consultation, greater than 50% of which was counseling/coordinating care for liver lesion.

## 2017-11-23 NOTE — Interval H&P Note (Signed)
History and Physical Interval Note:  11/23/2017 11:37 AM  Anna Cuevas  has presented today for surgery, with the diagnosis of coffee-ground emesis, gastric outlet obstruction  The various methods of treatment have been discussed with the patient and family. After consideration of risks, benefits and other options for treatment, the patient has consented to  Procedure(s): ESOPHAGOGASTRODUODENOSCOPY (EGD) WITH PROPOFOL (N/A) as a surgical intervention .  The patient's history has been reviewed, patient examined, no change in status, stable for surgery.  I have reviewed the patient's chart and labs.  Questions were answered to the patient's satisfaction.    Risks (bleeding, infection, bowel perforation that could require surgery, sedation-related changes in cardiopulmonary systems), benefits (identification and possible treatment of source of symptoms, exclusion of certain causes of symptoms), and alternatives (watchful waiting, radiographic imaging studies, empiric medical treatment)  were explained to patient in detail and patient wishes to proceed.  Dozier Berkovich

## 2017-11-23 NOTE — Anesthesia Preprocedure Evaluation (Signed)
Anesthesia Evaluation  Patient identified by MRN, date of birth, ID band Patient awake    Reviewed: Allergy & Precautions, NPO status , Patient's Chart, lab work & pertinent test results  Airway Mallampati: II  TM Distance: >3 FB Neck ROM: Full    Dental no notable dental hx.    Pulmonary neg pulmonary ROS,    Pulmonary exam normal breath sounds clear to auscultation       Cardiovascular hypertension, Normal cardiovascular exam+ dysrhythmias Atrial Fibrillation  Rhythm:Regular Rate:Normal     Neuro/Psych negative neurological ROS  negative psych ROS   GI/Hepatic negative GI ROS, Neg liver ROS,   Endo/Other  diabetes  Renal/GU negative Renal ROS  negative genitourinary   Musculoskeletal negative musculoskeletal ROS (+)   Abdominal   Peds negative pediatric ROS (+)  Hematology  (+) anemia ,   Anesthesia Other Findings   Reproductive/Obstetrics negative OB ROS                             Anesthesia Physical Anesthesia Plan  ASA: IV  Anesthesia Plan: General   Post-op Pain Management:    Induction: Intravenous and Rapid sequence  PONV Risk Score and Plan: 0 and Treatment may vary due to age or medical condition and Ondansetron  Airway Management Planned: Oral ETT  Additional Equipment:   Intra-op Plan:   Post-operative Plan: Extubation in OR  Informed Consent: I have reviewed the patients History and Physical, chart, labs and discussed the procedure including the risks, benefits and alternatives for the proposed anesthesia with the patient or authorized representative who has indicated his/her understanding and acceptance.   Dental advisory given  Plan Discussed with: CRNA and Surgeon  Anesthesia Plan Comments:         Anesthesia Quick Evaluation

## 2017-11-23 NOTE — Transfer of Care (Signed)
Immediate Anesthesia Transfer of Care Note  Patient: Anna Cuevas  Procedure(s) Performed: ESOPHAGOGASTRODUODENOSCOPY (EGD) WITH PROPOFOL (N/A )  Patient Location: Endoscopy Unit  Anesthesia Type:General  Level of Consciousness: awake, alert  and oriented  Airway & Oxygen Therapy: Patient Spontanous Breathing and Patient connected to nasal cannula oxygen  Post-op Assessment: Report given to RN and Post -op Vital signs reviewed and stable  Post vital signs: Reviewed and stable  Last Vitals:  Vitals Value Taken Time  BP 170/59 11/23/2017 12:25 PM  Temp    Pulse 82 11/23/2017 12:28 PM  Resp 28 11/23/2017 12:28 PM  SpO2 100 % 11/23/2017 12:28 PM  Vitals shown include unvalidated device data.  Last Pain:  Vitals:   11/23/17 1223  TempSrc: Oral  PainSc: 0-No pain         Complications: No apparent anesthesia complications

## 2017-11-23 NOTE — Progress Notes (Signed)
[name]  Anna Cuevas  VHQ:[id number] DOB: [birth year] DOA: 11/21/2017 PCP: Kathyrn Lass, MD    Brief Narrative:  77 y.o. female with hx of atrial fibrillation on Eliquis, hypertension, diabetes mellitus, GERD, and depression who presented with hematemesis.  Patient was hospitalized 5 weeks ago prior and was found to have new onset of atrial fibrillation.  She was discharged on Eliquis.  During that admission, patient was found to have anemia and received 1 unit of blood transfusion.  She reported nausea & vomiting th/o that stay, and also after going home. She vomited several times each day, and began to note intermit dark-colored blood in her vomitius.    Significant Events: 5/4 admit w/ hematemesis 5/6 EGD - severe stenosis at duodenal bulb   Subjective: The patient is resting comfortably in bed.  She reports very low-grade nausea but feels that the NG tube has significantly helped with this.  She denies chest pain abdominal pain or shortness of breath.  We have spoken very frankly about her current condition and our plans for further work-up.  Assessment & Plan:  Hematemesis - Acute UGIB Appears that significant bleeding has ceased -follow clinically -avoid blood thinners  Acute blood loss anemia  Status post 3 units PRBC thus far -continue to follow hemoglobin in serial fashion -transfuse as necessary to keep hemoglobin 7.0 or greater  Suspected GB CA > GOO NG remains in place -some biopsy specimens were sent today after her EGD -we will ask radiology to perform liver biopsy -oncology to be consulted once tissue diagnosis is available  Afib  Sinus rhythm at the time of exam today -not an anticoagulation candidate due to above  DM CBG well controlled at this time  HTN Blood pressure variable -follow without significant change in treatment at this time  DVT prophylaxis: SCDs Code Status: FULL CODE Family Communication: no family present at time  of exam  Disposition Plan: SDU  Consultants:  Gen Surgery GI  Antimicrobials:  none  Objective: Blood pressure (!) 176/51, pulse 88, temperature 98.2 F (36.8 C), temperature source Oral, resp. rate 17, height 5\' 3"  (1.6 m), weight 81.2 kg (179 lb), SpO2 100 %.  Intake/Output Summary (Last 24 hours) at 11/23/2017 1353 Last data filed at 11/23/2017 1228 Gross per 24 hour  Intake 5639.17 ml  Output 1340 ml  Net 4299.17 ml   Filed Weights   11/22/17 1200 11/23/17 1111  Weight: 81.4 kg (179 lb 7.3 oz) 81.2 kg (179 lb)    Examination: General: No acute respiratory distress - NG present  Lungs: Clear to auscultation bilaterally without wheezes or crackles Cardiovascular: Regular rate and rhythm without murmur gallop or rub normal S1 and S2 Abdomen: Nontender, nondistended, soft, bowel sounds positive, no rebound, no ascites, no appreciable mass Extremities: No significant cyanosis, clubbing, or edema bilateral lower extremities  CBC: Recent Labs  Lab 11/21/17 1715  11/22/17 1310 11/22/17 1754 11/23/17 0230  WBC 12.0*   < > 9.3 8.4 7.6  NEUTROABS 10.6*  --   --   --   --   HGB 7.5*   < > 7.0* 6.6* 7.5*  HCT 23.9*   < > 21.7* 20.6* 23.4*  MCV 80.2   < > 80.4 80.8 81.8  PLT 467*   < > 333 294 245   < > = values in this interval not displayed.   Basic Metabolic Panel: Recent Labs  Lab 11/21/17 1715  11/21/17 1742 11/22/17 0438 11/23/17 0230  NA 132*   < > 132* 134* 139  K 3.8   < > 4.1 3.9 3.2*  CL 94*   < > 94* 97* 103  CO2 24  --   --  25 25  GLUCOSE 240*   < > 227* 193* 133*  BUN 32*   < > 30* 34* 21*  CREATININE 1.17*   < > 0.90 1.02* 0.76  CALCIUM 8.6*  --   --  8.4* 8.0*   < > = values in this interval not displayed.   GFR: Estimated Creatinine Clearance: 59.4 mL/min (by C-G formula based on SCr of 0.76 mg/dL).  Liver Function Tests: Recent Labs  Lab 11/21/17 1715  AST 20  ALT 22  ALKPHOS 132*  BILITOT 0.7  PROT 6.1*  ALBUMIN 3.3*   Recent Labs    Lab 11/21/17 1715  LIPASE 52*   Coagulation Profile: Recent Labs  Lab 11/21/17 1715 11/21/17 2352  INR 1.33 1.30    HbA1C: Hgb A1c MFr Bld  Date/Time Value Ref Range Status  10/20/2017 02:24 AM 6.9 (H) 4.8 - 5.6 % Final    Comment:    (NOTE) Pre diabetes:          5.7%-6.4% Diabetes:              >6.4% Glycemic control for   <7.0% adults with diabetes     CBG: Recent Labs  Lab 11/22/17 0818 11/22/17 1645 11/22/17 2104 11/23/17 0915 11/23/17 1316  GLUCAP 155* 146* 150* 114* 129*    Recent Results (from the past 240 hour(s))  Culture, blood (Routine X 2) w Reflex to ID Panel     Status: None (Preliminary result)   Collection Time: 11/22/17  6:05 AM  Result Value Ref Range Status   Specimen Description BLOOD LEFT HAND  Final   Special Requests   Final    BOTTLES DRAWN AEROBIC AND ANAEROBIC Blood Culture adequate volume   Culture   Final    NO GROWTH 1 DAY Performed at [facility], [place] [address] [place], [place] [address]    Report Status PENDING  Incomplete  Culture, blood (Routine X 2) w Reflex to ID Panel     Status: None (Preliminary result)   Collection Time: 11/22/17  6:13 AM  Result Value Ref Range Status   Specimen Description BLOOD LEFT ANTECUBITAL  Final   Special Requests   Final    BOTTLES DRAWN AEROBIC AND ANAEROBIC Blood Culture adequate volume   Culture   Final    NO GROWTH 1 DAY Performed at [facility], [place] [address], [place], [place] [address]    Report Status PENDING  Incomplete  MRSA PCR Screening     Status: None   Collection Time: 11/22/17  1:44 PM  Result Value Ref Range Status   MRSA by PCR NEGATIVE NEGATIVE Final    Comment:        The GeneXpert MRSA Assay (FDA approved for NASAL specimens only), is one component of a comprehensive MRSA colonization surveillance program. It is not intended to diagnose MRSA infection nor to guide or monitor treatment for MRSA infections. Performed at [place], [place] [address], [place], [place] [address]      Scheduled Meds: . insulin aspart  0-5 Units Subcutaneous QHS  . insulin aspart  0-9 Units Subcutaneous TID WC  . mouth rinse  15 mL Mouth Rinse BID  . metoprolol tartrate  5 mg Intravenous Q8H  LOS: 2 days   Cherene Altes, MD [facility] Office  [phone] Pager - Text Page per Amion as per below:  On-Call/Text Page:      [email or web address]      password [email or web address]  If 7PM-7AM, please contact night-coverage [email or web address] Password [email or web address] 11/23/2017, 1:53 PM

## 2017-11-24 ENCOUNTER — Inpatient Hospital Stay: Payer: Self-pay

## 2017-11-24 ENCOUNTER — Inpatient Hospital Stay (HOSPITAL_COMMUNITY): Payer: PPO

## 2017-11-24 ENCOUNTER — Encounter (HOSPITAL_COMMUNITY): Payer: Self-pay | Admitting: Gastroenterology

## 2017-11-24 DIAGNOSIS — K311 Adult hypertrophic pyloric stenosis: Secondary | ICD-10-CM

## 2017-11-24 LAB — COMPREHENSIVE METABOLIC PANEL
ALBUMIN: 2.4 g/dL — AB (ref 3.5–5.0)
ALT: 14 U/L (ref 14–54)
ANION GAP: 11 (ref 5–15)
AST: 15 U/L (ref 15–41)
Alkaline Phosphatase: 102 U/L (ref 38–126)
BILIRUBIN TOTAL: 0.7 mg/dL (ref 0.3–1.2)
BUN: 13 mg/dL (ref 6–20)
CO2: 23 mmol/L (ref 22–32)
Calcium: 7.9 mg/dL — ABNORMAL LOW (ref 8.9–10.3)
Chloride: 103 mmol/L (ref 101–111)
Creatinine, Ser: 0.7 mg/dL (ref 0.44–1.00)
GFR calc Af Amer: >60 mL/min (ref >60)
GFR calc non Af Amer: >60 mL/min (ref >60)
GLUCOSE: 99 mg/dL (ref 65–99)
POTASSIUM: 3.3 mmol/L — AB (ref 3.5–5.1)
SODIUM: 137 mmol/L (ref 135–145)
TOTAL PROTEIN: 4.6 g/dL — AB (ref 6.5–8.1)

## 2017-11-24 LAB — CBC
HCT: 24.3 % — ABNORMAL LOW (ref 36.0–46.0)
HEMOGLOBIN: 7.8 g/dL — AB (ref 12.0–15.0)
MCH: 26.4 pg (ref 26.0–34.0)
MCHC: 32.1 g/dL (ref 30.0–36.0)
MCV: 82.1 fL (ref 78.0–100.0)
Platelets: 252 10*3/uL (ref 150–400)
RBC: 2.96 MIL/uL — AB (ref 3.87–5.11)
RDW: 14.9 % (ref 11.5–15.5)
WBC: 7.5 10*3/uL (ref 4.0–10.5)

## 2017-11-24 LAB — GLUCOSE, CAPILLARY
GLUCOSE-CAPILLARY: 91 mg/dL (ref 65–99)
GLUCOSE-CAPILLARY: 101 mg/dL — AB (ref 65–99)
GLUCOSE-CAPILLARY: 89 mg/dL (ref 65–99)
Glucose-Capillary: 96 mg/dL (ref 65–99)

## 2017-11-24 LAB — PROTIME-INR
INR: 1.15
Prothrombin Time: 14.6 seconds (ref 11.4–15.2)

## 2017-11-24 MED ORDER — FENTANYL CITRATE (PF) 100 MCG/2ML IJ SOLN
INTRAMUSCULAR | Status: AC
  Filled 2017-11-24: qty 2

## 2017-11-24 MED ORDER — MIDAZOLAM HCL 2 MG/2ML IJ SOLN
INTRAMUSCULAR | Status: AC
  Filled 2017-11-24: qty 2

## 2017-11-24 MED ORDER — POTASSIUM CHLORIDE 10 MEQ/100ML IV SOLN
10.0000 meq | INTRAVENOUS | Status: AC
  Administered 2017-11-24 (×4): 10 meq via INTRAVENOUS
  Filled 2017-11-24 (×5): qty 100

## 2017-11-24 MED ORDER — MIDAZOLAM HCL 2 MG/2ML IJ SOLN
INTRAMUSCULAR | Status: AC | PRN
Start: 2017-11-24 — End: 2017-11-24
  Administered 2017-11-24: 1 mg via INTRAVENOUS

## 2017-11-24 MED ORDER — INSULIN ASPART 100 UNIT/ML ~~LOC~~ SOLN
0.0000 [IU] | SUBCUTANEOUS | Status: DC
  Administered 2017-11-25: 1 [IU] via SUBCUTANEOUS
  Administered 2017-11-26 (×3): 2 [IU] via SUBCUTANEOUS

## 2017-11-24 MED ORDER — GELATIN ABSORBABLE 12-7 MM EX MISC
CUTANEOUS | Status: AC
  Filled 2017-11-24: qty 1

## 2017-11-24 MED ORDER — FENTANYL CITRATE (PF) 100 MCG/2ML IJ SOLN
INTRAMUSCULAR | Status: AC | PRN
  Administered 2017-11-24: 50 ug via INTRAVENOUS

## 2017-11-24 MED ORDER — PHENOL 1.4 % MT LIQD: 1.0000 | OROMUCOSAL | Status: DC | PRN

## 2017-11-24 MED ORDER — MENTHOL 3 MG MT LOZG
1.0000 | LOZENGE | OROMUCOSAL | Status: DC | PRN
  Administered 2017-11-24 – 2017-11-25 (×2): 3 mg via ORAL
  Filled 2017-11-24 (×3): qty 9

## 2017-11-24 MED ORDER — LIDOCAINE HCL (PF) 1 % IJ SOLN
INTRAMUSCULAR | Status: AC
  Filled 2017-11-24: qty 30

## 2017-11-24 NOTE — Progress Notes (Signed)
[facility] TEAM 1 - Stepdown/ICU TEAM  KRISTEENA MEINEKE  WUJ:[id number] DOB: July 21, 1941 DOA: 11/21/2017 PCP: Kathyrn Lass, MD    Brief Narrative:  77 y.o. female with hx of atrial fibrillation on Eliquis, hypertension, diabetes mellitus, GERD, and depression who presented with hematemesis.  Patient was hospitalized 5 weeks ago prior and was found to have new onset of atrial fibrillation.  She was discharged on Eliquis.  During that admission, patient was found to have anemia and received 1 unit of blood transfusion.  She reported nausea & vomiting th/o that stay, and also after going home. She vomited several times each day, and began to note intermit dark-colored blood in her vomitius.    Significant Events: 5/4 admit w/ hematemesis 5/6 EGD - severe stenosis at duodenal bulb   Subjective: No new complaints today.  Is tolerating her NG tube without significant difficulty.  Denies chest pain shortness of breath vomiting or abdominal pain.  Assessment & Plan:  Hematemesis - Acute UGIB Appears that significant bleeding has ceased -follow clinically -avoid blood thinners for now w/ plan for liver bx today and possible need for surgery soon   Acute blood loss anemia  Status post 3 units PRBC thus far -continue to follow hemoglobin in serial fashion -transfuse as necessary to keep hemoglobin 7.0 or greater - appears that Hgb is stable for now   Suspected GB CA > GOO NG remains in place -some biopsy specimens were sent after her EGD 5/6 - Radiology to perform liver biopsy today - Oncology to be consulted once tissue diagnosis is available - Gen Surgery following  Nutrition  Duodenal stricture is very tight per EGD such that oral intake is not possible at this time - may require at very least a G tube, but possibly a bypass - in interim will place PICC and begin TNA  Afib  Sinus rhythm again today - not an anticoagulation at this time due to above  DM CBG well controlled at this time due to  lack of intake   HTN Blood pressure variable - follow without significant change in treatment at this time  DVT prophylaxis: SCDs Code Status: FULL CODE Family Communication: no family present at time of exam  Disposition Plan: tele bed - liver bx today - Onc consult when diagnosis made via path - decide on long term nutrition plan   Consultants:  Gen Surgery GI  Antimicrobials:  none  Objective: Blood pressure (!) 142/60, pulse 79, temperature 97.6 F (36.4 C), temperature source Oral, resp. rate 14, height 5\' 3"  (1.6 m), weight 81.2 kg (179 lb), SpO2 100 %.  Intake/Output Summary (Last 24 hours) at 11/24/2017 1206 Last data filed at 11/24/2017 1100 Gross per 24 hour  Intake 2803.33 ml  Output 1100 ml  Net 1703.33 ml   Filed Weights   11/22/17 1200 11/23/17 1111  Weight: 81.4 kg (179 lb 7.3 oz) 81.2 kg (179 lb)    Examination: General: No acute respiratory distress - NG in place - alert and pleasant  Lungs: Clear to auscultation bilaterally - no wheezing  Cardiovascular: Regular rate and rhythm - no M  Abdomen: Nontender, nondistended, soft, bowel sounds positive, no rebound Extremities: No signif edema B LE   CBC: Recent Labs  Lab 11/21/17 1715  11/22/17 1754 11/23/17 0230 11/24/17 0304  WBC 12.0*   < > 8.4 7.6 7.5  NEUTROABS 10.6*  --   --   --   --   HGB 7.5*   < >  6.6* 7.5* 7.8*  HCT 23.9*   < > 20.6* 23.4* 24.3*  MCV 80.2   < > 80.8 81.8 82.1  PLT 467*   < > 294 245 252   < > = values in this interval not displayed.   Basic Metabolic Panel: Recent Labs  Lab 11/22/17 0438 11/23/17 0230 11/24/17 0304  NA 134* 139 137  K 3.9 3.2* 3.3*  CL 97* 103 103  CO2 25 25 23   GLUCOSE 193* 133* 99  BUN 34* 21* 13  CREATININE 1.02* 0.76 0.70  CALCIUM 8.4* 8.0* 7.9*   GFR: Estimated Creatinine Clearance: 59.4 mL/min (by C-G formula based on SCr of 0.7 mg/dL).  Liver Function Tests: Recent Labs  Lab 11/21/17 1715 11/24/17 0304  AST 20 15  ALT 22 14    ALKPHOS 132* 102  BILITOT 0.7 0.7  PROT 6.1* 4.6*  ALBUMIN 3.3* 2.4*   Recent Labs  Lab 11/21/17 1715  LIPASE 52*   Coagulation Profile: Recent Labs  Lab 11/21/17 1715 11/21/17 2352 11/24/17 0304  INR 1.33 1.30 1.15    HbA1C: Hgb A1c MFr Bld  Date/Time Value Ref Range Status  10/20/2017 02:24 AM 6.9 (H) 4.8 - 5.6 % Final    Comment:    (NOTE) Pre diabetes:          5.7%-6.4% Diabetes:              >6.4% Glycemic control for   <7.0% adults with diabetes     CBG: Recent Labs  Lab 11/23/17 1316 11/23/17 1634 11/23/17 2117 11/24/17 0422 11/24/17 0813  GLUCAP 129* 145* 111* 91 101*    Recent Results (from the past 240 hour(s))  Culture, blood (Routine X 2) w Reflex to ID Panel     Status: None (Preliminary result)   Collection Time: 11/22/17  6:05 AM  Result Value Ref Range Status   Specimen Description BLOOD LEFT HAND  Final   Special Requests   Final    BOTTLES DRAWN AEROBIC AND ANAEROBIC Blood Culture adequate volume   Culture   Final    NO GROWTH 1 DAY Performed at [facility], [place] [address] [place], [place] [address]    Report Status PENDING  Incomplete  Culture, blood (Routine X 2) w Reflex to ID Panel     Status: None (Preliminary result)   Collection Time: 11/22/17  6:13 AM  Result Value Ref Range Status   Specimen Description BLOOD LEFT ANTECUBITAL  Final   Special Requests   Final    BOTTLES DRAWN AEROBIC AND ANAEROBIC Blood Culture adequate volume   Culture   Final    NO GROWTH 1 DAY Performed at [facility], [place] [address] [place], [place] [address]    Report Status PENDING  Incomplete  MRSA PCR Screening     Status: None   Collection Time: 11/22/17  1:44 PM  Result Value Ref Range Status   MRSA by PCR NEGATIVE NEGATIVE Final    Comment:        The GeneXpert MRSA Assay (FDA approved for NASAL specimens only), is one component of a comprehensive MRSA colonization surveillance program. It is not intended to diagnose  MRSA infection nor to guide or monitor treatment for MRSA infections. Performed at [facility], [place] [address] [place], [place] [address]      Scheduled Meds: . insulin aspart  0-5 Units Subcutaneous QHS  . insulin aspart  0-9 Units Subcutaneous TID WC  .  mouth rinse  15 mL Mouth Rinse BID  . metoprolol tartrate  5 mg Intravenous Q6H     LOS: 3 days   Cherene Altes, MD [facility] Office  [phone] Pager - Text Page per Shea Evans as per below:  On-Call/Text Page:      [email or web address]      password [email or web address]  If 7PM-7AM, please contact night-coverage [email or web address] Password [email or web address] 11/24/2017, 12:06 PM

## 2017-11-24 NOTE — Sedation Documentation (Signed)
Patient denies pain and is resting comfortably.  

## 2017-11-24 NOTE — Progress Notes (Signed)
At bedside for 30+ minutes discussing PICC-purpose, risk, benefit and procedure.  Patient's daughter at bedside.  When I explained to patient that she would be covered with a full body sterile drape she immediately stated that she could not tolerate being under the drape for any length of time.  Discussed that this is a must for infection prevention with the only alternative being a mask and cap.  Patient currently has NGT in that she is holding to prevent any movement of the tube and daughter states that she would not be able to tolerate having a mask on with the tube.  I told the patient and daughter that there would be two PICC RNs available in the morning and we could have one of the PICC nurses hold the drape off of her face once field was set.  I then put a thin bed sheet over her and showed her how this would occur, leaving half of her face uncovered but the field remaining sterile.  Patient immediately started to push the sheet off of the half of her face that the sheet was touching and attempting to remove the sheet completely.  Daughter at bedside witnessing this and attempting to explain to her mother that she would have this open area with air flow but patient told her daughter that she would not be able to tolerate it.  Discussed that there is a possibility that patient could have the PICC placed in Interventional Radiology under sedation but the doctor would need to make that decision and schedule with IR.  Daughter is agreeable that this is what needs to happen if doctor deems that this is necessary.  Kenney Houseman, RN bedside nurse informed of all the above and will notify MD.  Carolee Rota, RN VAST

## 2017-11-24 NOTE — Progress Notes (Signed)
Back from IR by bed awake and alert, Band aid underneath the right breast where they went  trough dry and intact. Denied any discomfort. Continue to monitor.

## 2017-11-24 NOTE — Sedation Documentation (Signed)
Patient is resting comfortably. 

## 2017-11-24 NOTE — Progress Notes (Signed)
[facility] Gastroenterology Progress Note  Anna Cuevas 77 y.o. [birth year]  CC:  Gastric outlet obstruction, nausea and vomiting. Possible metastatic gallbladder carcinoma   Subjective: patient complaining of fatigue and weakness. No bowel movement.   Objective: Vital signs in last 24 hours: Vitals:   11/24/17 0500 11/24/17 0700  BP: 125/63 (!) 142/60  Pulse: 71 79  Resp: 19 14  Temp:  97.6 F (36.4 C)  SpO2: 98% 100%    Physical Exam:  Gen. Alert and oriented 3. Some discomfort from NG tube. Abdomen. Soft, nontender, nondistended, bowel sounds present.  Lab Results: Recent Labs    11/23/17 0230 11/24/17 0304  NA 139 137  K 3.2* 3.3*  CL 103 103  CO2 25 23  GLUCOSE 133* 99  BUN 21* 13  CREATININE 0.76 0.70  CALCIUM 8.0* 7.9*   Recent Labs    11/21/17 1715 11/24/17 0304  AST 20 15  ALT 22 14  ALKPHOS 132* 102  BILITOT 0.7 0.7  PROT 6.1* 4.6*  ALBUMIN 3.3* 2.4*   Recent Labs    11/21/17 1715  11/23/17 0230 11/24/17 0304  WBC 12.0*   < > 7.6 7.5  NEUTROABS 10.6*  --   --   --   HGB 7.5*   < > 7.5* 7.8*  HCT 23.9*   < > 23.4* 24.3*  MCV 80.2   < > 81.8 82.1  PLT 467*   < > 245 252   < > = values in this interval not displayed.   Recent Labs    11/21/17 2352 11/24/17 0304  LABPROT 16.1* 14.6  INR 1.30 1.15      Assessment/Plan: - gastric outlet obstruction was likely from metastatic gallbladder carcinoma. EGD yesterday showed duodenal bulb stenosis.  Biopsies taken. - Liver metastasis. Possible interventional radiology guided liver biopsy today. - acute and chronic anemia. - choledocholithiasis with 3 mm stone into the common bile duct.  - Atrial fibrillation. Last dose of Eliquis 05/03 evening      Recommendations -------------------------- - Continue supportive care for now. Follow biopsy findings. Possible liver biopsy today. - GI will follow.  Otis Brace MD, [place] 11/24/2017, 10:17 AM  Contact #  [phone]

## 2017-11-24 NOTE — Progress Notes (Signed)
Patient ID: Anna Cuevas, female   DOB: 04/29/1941, 77 y.o.   MRN: [id number]    1 Day Post-Op  Subjective: No complaints except throat pain still.  No flatus or BMs.  No abdominal pain.    Objective: Vital signs in last 24 hours: Temp:  [98 F (36.7 C)-98.6 F (37 C)] 98.2 F (36.8 C) (05/07 0300) Pulse Rate:  [69-89] 71 (05/07 0500) Resp:  [16-30] 19 (05/07 0500) BP: (113-176)/(44-75) 125/63 (05/07 0500) SpO2:  [95 %-100 %] 98 % (05/07 0500) Weight:  [81.2 kg (179 lb)] 81.2 kg (179 lb) (05/06 1111) Last BM Date: 11/21/17  Intake/Output from previous day: 05/06 0701 - 05/07 0700 In: 2880 [I.V.:2880] Out: 1550 [Urine:1450; Emesis/NG output:100] Intake/Output this shift: No intake/output data recorded.  PE: Heart: regular Lungs: CTAB Abd: soft, NT, ND, NGT in place with some bilious output.    Lab Results:  Recent Labs    11/23/17 0230 11/24/17 0304  WBC 7.6 7.5  HGB 7.5* 7.8*  HCT 23.4* 24.3*  PLT 245 252   BMET Recent Labs    11/23/17 0230 11/24/17 0304  NA 139 137  K 3.2* 3.3*  CL 103 103  CO2 25 23  GLUCOSE 133* 99  BUN 21* 13  CREATININE 0.76 0.70  CALCIUM 8.0* 7.9*   PT/INR Recent Labs    11/21/17 2352 11/24/17 0304  LABPROT 16.1* 14.6  INR 1.30 1.15   CMP     Component Value Date/Time   NA 137 11/24/2017 0304   K 3.3 (L) 11/24/2017 0304   CL 103 11/24/2017 0304   CO2 23 11/24/2017 0304   GLUCOSE 99 11/24/2017 0304   BUN 13 11/24/2017 0304   CREATININE 0.70 11/24/2017 0304   CALCIUM 7.9 (L) 11/24/2017 0304   PROT 4.6 (L) 11/24/2017 0304   ALBUMIN 2.4 (L) 11/24/2017 0304   AST 15 11/24/2017 0304   ALT 14 11/24/2017 0304   ALKPHOS 102 11/24/2017 0304   BILITOT 0.7 11/24/2017 0304   GFRNONAA >60 11/24/2017 0304   GFRAA >60 11/24/2017 0304   Lipase     Component Value Date/Time   LIPASE 52 (H) 11/21/2017 1715       Studies/Results: Ct Chest Wo Contrast  Result Date: 11/23/2017 CLINICAL DATA:  Cough, liver metastasis  suspected. Patient denies chest pain and dyspnea. EXAM: CT CHEST WITHOUT CONTRAST TECHNIQUE: Multidetector CT imaging of the chest was performed following the standard protocol without IV contrast. COMPARISON:  CT 07/08/2007, CXR 10/15/2017, CT abdomen and pelvis 11/21/2017 FINDINGS: Cardiovascular: Top-normal size heart without pericardial effusion or thickening. Left main in three-vessel coronary arteriosclerosis is noted. There is moderate thoracic aortic atherosclerosis without aneurysm. The main pulmonary artery is dilated up to 3.5 cm compatible with changes of chronic pulmonary hypertension. Mediastinum/Nodes: Calcified mediastinal lymph nodes compatible with old granulomatous disease. Patent trachea and mainstem bronchi. Gastric tube is seen coursing within the thoracic esophagus and extending into the moderately distended included stomach. Lungs/Pleura: Small left effusion with atelectasis. Scarring and atelectasis at the apices. Superimposed ground-glass opacities in both upper lobes, left greater than right likely representing areas of alveolitis/pneumonitis. No dominant mass is noted. There is no pneumothorax. Upper Abdomen: Small amount of perisplenic ascites. Previously noted ill-defined hepatic masses are less distinct on this unenhanced study. Gallbladder is not included on images provided. Musculoskeletal: No acute nor suspicious osseous abnormality. No aggressive osseous lesions. IMPRESSION: 1. Faint ground-glass infiltrates the upper lobes, left greater greater than right likely representing areas of alveolitis or  pneumonitis. Superimposed atelectasis and/or scarring in both upper lobes. 2. No dominant pulmonary mass is seen. Small left effusion with atelectasis. 3. Left main and three-vessel coronary arteriosclerosis. 55. Stigmata of old granulomatous disease with calcified mediastinal lymph nodes. 5. Small amount of perisplenic ascites. Previously noted hepatic lesions are difficult to identify  on this unenhanced exam. Aortic Atherosclerosis (ICD10-I70.0). Electronically Signed   By: Ashley Royalty M.D.   On: 11/23/2017 18:40    Anti-infectives: Anti-infectives (From admission, onward)   None       Assessment/Plan Gastric outlet obstruction, likely secondary to abnormal gallbladder with invasion of liver and duodenum -EGD yesterday, tentative IR bx today of liver lesion to determine etiology of this malignancy.  This will determine what type or if the patient will need surgical intervention, palliative g-tube vs palliative GJ bypass. -cont NGT for decompression -cont to hold eliquis -CEA elevated at 20.6 -CA19-9 also elevated at 314 -CT of the chest essentially negative for met disease -onc consult -will follow  Right hepatic liver masses x 3 Acute on chronic anemia Anticoagulated on eliquis -on hold DM HTN Choledocholithiasis -no biliary symptoms currently, may be incidental find per GI.  GI bleed  FEN - NPO/NGT, should probably consider picc/TNA for nutritional support while work up is in progress VTE - SCDs ID - none currently   LOS: 3 days    Henreitta Cea , [facility] 11/24/2017, 7:47 AM Pager: [phone]

## 2017-11-24 NOTE — Progress Notes (Signed)
PHARMACY - ADULT TOTAL PARENTERAL NUTRITION CONSULT NOTE   Pharmacy Consult:  TPN Indication:  GOO  Patient Measurements: Height: 5\' 3"  (160 cm) Weight: 179 lb (81.2 kg) IBW/kg (Calculated) : 52.4 TPN AdjBW (KG): 59.6 Body mass index is 31.71 kg/m.  Assessment:  78 YOF presented on 11/21/17 with hematemesis.  Patient was hospitalized 5 weeks ago d/t N/V and continued to have those symptoms post discharge.  CT concerning for GOO and gallbladder carcinoma with hepatic mets as well as choledocholithiasis.  She is s/p EGD with duodenal biopsy on 5/6 and liver biopsy on 11/24/17.  Given expected prolonged NPO status and while work-up is in progress, Pharmacy consulted to initiate TPN.  Patient reported that she gained weight instead of losing weight, and was eating about 50% of her meals for 2 weeks PTA. She is at risk for refeeding syndrome.  GI: NG O/P 131mL - Protonix gtt Endo: DM on metformin PTA - CBGs controlled on SSI Insulin requirements in the past 24 hours: 1 unit SSI  Lytes: all WNL except K+ 3.3 (4 runs ordered) Renal: SCr 0.7 stable, BUN WNL - UOP 0.7 ml/kg/hr, NS at 100/hr Pulm: stable on RA Cards: HTN - BP elevated post bx - IV metoprolol AC: Eliquis PTA for Afib dx in March, s/p KCentra on 5/4 - hgb low at 7.8, plts WNL Hepatobil: LFTs / tbili WNL Neuro: A&O ID: afebrile, WBC WNL - not on abx TPN Access: PICC to be placed TPN start date: 11/25/17  Nutritional Goals (RD rec pending):  Current Nutrition:  NPO   Plan:  Consult for TPN received after cut-off time and PICC hasn't been placed; therefore, will start tomorrow. TPN labs in AM   Jaise Moser D. Mina Marble, PharmD, BCPS, [place] Pager:  [phone] 11/24/2017, 3:09 PM

## 2017-11-24 NOTE — Progress Notes (Signed)
To  IR for liver biopsy by bed. Stable.

## 2017-11-24 NOTE — Procedures (Signed)
LIVER METS  S/P US LIVER MASS CORE BX  No comp Stable EBL MIN Path pending Full report in pacs

## 2017-11-25 ENCOUNTER — Inpatient Hospital Stay (HOSPITAL_COMMUNITY): Payer: PPO

## 2017-11-25 LAB — COMPREHENSIVE METABOLIC PANEL
ALT: 15 U/L (ref 14–54)
ANION GAP: 11 (ref 5–15)
AST: 17 U/L (ref 15–41)
Albumin: 2.5 g/dL — ABNORMAL LOW (ref 3.5–5.0)
Alkaline Phosphatase: 112 U/L (ref 38–126)
BUN: 7 mg/dL (ref 6–20)
CALCIUM: 7.9 mg/dL — AB (ref 8.9–10.3)
CHLORIDE: 102 mmol/L (ref 101–111)
CO2: 23 mmol/L (ref 22–32)
CREATININE: 0.74 mg/dL (ref 0.44–1.00)
GFR calc non Af Amer: >60 mL/min (ref >60)
Glucose, Bld: 91 mg/dL (ref 65–99)
POTASSIUM: 3 mmol/L — AB (ref 3.5–5.1)
Sodium: 136 mmol/L (ref 135–145)
Total Bilirubin: 1 mg/dL (ref 0.3–1.2)
Total Protein: 4.8 g/dL — ABNORMAL LOW (ref 6.5–8.1)

## 2017-11-25 LAB — DIFFERENTIAL
BASOS PCT: 0 %
Basophils Absolute: 0 10*3/uL (ref 0.0–0.1)
EOS PCT: 2 %
Eosinophils Absolute: 0.1 10*3/uL (ref 0.0–0.7)
LYMPHS PCT: 14 %
Lymphs Abs: 1.1 10*3/uL (ref 0.7–4.0)
MONO ABS: 0.6 10*3/uL (ref 0.1–1.0)
MONOS PCT: 7 %
NEUTROS ABS: 5.9 10*3/uL (ref 1.7–7.7)
Neutrophils Relative %: 77 %

## 2017-11-25 LAB — MAGNESIUM: Magnesium: 1.1 mg/dL — ABNORMAL LOW (ref 1.7–2.4)

## 2017-11-25 LAB — PHOSPHORUS: PHOSPHORUS: 2.8 mg/dL (ref 2.5–4.6)

## 2017-11-25 LAB — CBC
HCT: 24.2 % — ABNORMAL LOW (ref 36.0–46.0)
Hemoglobin: 7.8 g/dL — ABNORMAL LOW (ref 12.0–15.0)
MCH: 26.1 pg (ref 26.0–34.0)
MCHC: 32.2 g/dL (ref 30.0–36.0)
MCV: 80.9 fL (ref 78.0–100.0)
PLATELETS: 253 10*3/uL (ref 150–400)
RBC: 2.99 MIL/uL — ABNORMAL LOW (ref 3.87–5.11)
RDW: 14.9 % (ref 11.5–15.5)
WBC: 7.6 10*3/uL (ref 4.0–10.5)

## 2017-11-25 LAB — GLUCOSE, CAPILLARY
GLUCOSE-CAPILLARY: 115 mg/dL — AB (ref 65–99)
Glucose-Capillary: 141 mg/dL — ABNORMAL HIGH (ref 65–99)
Glucose-Capillary: 86 mg/dL (ref 65–99)
Glucose-Capillary: 88 mg/dL (ref 65–99)
Glucose-Capillary: 93 mg/dL (ref 65–99)

## 2017-11-25 LAB — TRIGLYCERIDES: Triglycerides: 148 mg/dL (ref <150)

## 2017-11-25 LAB — PREALBUMIN: Prealbumin: 10 mg/dL — ABNORMAL LOW (ref 18–38)

## 2017-11-25 MED ORDER — MAGNESIUM SULFATE 2 GM/50ML IV SOLN
2.0000 g | Freq: Once | INTRAVENOUS | Status: AC
  Administered 2017-11-25: 2 g via INTRAVENOUS
  Filled 2017-11-25: qty 50

## 2017-11-25 MED ORDER — HEPARIN SODIUM (PORCINE) 5000 UNIT/ML IJ SOLN
5000.0000 [IU] | Freq: Three times a day (TID) | INTRAMUSCULAR | Status: AC
  Administered 2017-11-25 – 2017-11-29 (×14): 5000 [IU] via SUBCUTANEOUS
  Filled 2017-11-25 (×13): qty 1

## 2017-11-25 MED ORDER — LIDOCAINE HCL 1 % IJ SOLN
INTRAMUSCULAR | Status: AC
  Filled 2017-11-25: qty 20

## 2017-11-25 MED ORDER — TRAVASOL 10 % IV SOLN
INTRAVENOUS | Status: AC
  Administered 2017-11-25: 18:00:00 via INTRAVENOUS
  Filled 2017-11-25: qty 324

## 2017-11-25 MED ORDER — SODIUM CHLORIDE 0.9% FLUSH: 10.0000 mL | INTRAVENOUS | Status: DC | PRN

## 2017-11-25 MED ORDER — POTASSIUM PHOSPHATES 15 MMOLE/5ML IV SOLN
15.0000 mmol | Freq: Once | INTRAVENOUS | Status: AC
  Administered 2017-11-25: 15 mmol via INTRAVENOUS
  Filled 2017-11-25: qty 5

## 2017-11-25 MED ORDER — POTASSIUM CHLORIDE 10 MEQ/100ML IV SOLN
INTRAVENOUS | Status: AC
  Administered 2017-11-25: 10 meq
  Filled 2017-11-25: qty 100

## 2017-11-25 MED ORDER — LIDOCAINE HCL (PF) 1 % IJ SOLN
INTRAMUSCULAR | Status: AC | PRN
  Administered 2017-11-25: 10 mL

## 2017-11-25 MED ORDER — POTASSIUM CHLORIDE 10 MEQ/100ML IV SOLN
10.0000 meq | INTRAVENOUS | Status: AC
  Administered 2017-11-25 (×5): 10 meq via INTRAVENOUS
  Filled 2017-11-25 (×4): qty 100

## 2017-11-25 NOTE — Procedures (Signed)
RUE PICC SVC RA EBL 0 Comp 0  

## 2017-11-25 NOTE — Progress Notes (Signed)
To IR by bed for PICC Line insertion. Stable.

## 2017-11-25 NOTE — Progress Notes (Addendum)
   Patient Status: [facility] - In-pt  Assessment and Plan: Patient in need of venous access.  PICC IV access; meds; nutrition ______________________________________________________________________   History of Present Illness: Anna Cuevas is a 77 y.o. female   GI Bleed GI Tumor Presumed hep mets  Allergies and medications reviewed.   Review of Systems: A 12 point ROS discussed and pertinent positives are indicated in the HPI above.  All other systems are negative.   Vital Signs: BP (!) 164/68 (BP Location: Left Wrist)   Pulse 80   Temp 98.3 F (36.8 C) (Oral)   Resp 20   Ht 5\' 3"  (1.6 m)   Wt 179 lb (81.2 kg)   SpO2 99%   BMI 31.71 kg/m   Physical Exam  Constitutional: She is oriented to person, place, and time.  Cardiovascular: Normal rate and regular rhythm.  Pulmonary/Chest: Effort normal and breath sounds normal.  Abdominal: Soft. Bowel sounds are normal.  Musculoskeletal: Normal range of motion.  Neurological: She is alert and oriented to person, place, and time.  Skin: Skin is warm and dry.  Psychiatric: She has a normal mood and affect. Her behavior is normal.  Nursing note and vitals reviewed.   Imaging reviewed.   Labs:  COAGS: Recent Labs    10/15/17 1110 11/21/17 1715 11/21/17 2352 11/24/17 0304  INR 1.04 1.33 1.30 1.15  APTT  --   --  32  --     BMP: Recent Labs    11/22/17 0438 11/23/17 0230 11/24/17 0304 11/25/17 0327  NA 134* 139 137 136  K 3.9 3.2* 3.3* 3.0*  CL 97* 103 103 102  CO2 25 25 23 23   GLUCOSE 193* 133* 99 91  BUN 34* 21* 13 7  CALCIUM 8.4* 8.0* 7.9* 7.9*  CREATININE 1.02* 0.76 0.70 0.74  GFRNONAA 52* >60 >60 >60  GFRAA 60* >60 >60 >60    Discussed with Dtr the PICC line placement She states pt is extremely claustrophobic- may need sedation. She also stated that if drape would not go over face-- she would probably be fine. I asked IR to be careful to not put drape over pts face. They are  agreeable   Electronically Signed: Chelby Salata A, PA-C 11/25/2017, 9:37 AM   I spent a total of 15 minutes in face to face in clinical consultation, greater than 50% of which was counseling/coordinating care for venous access.Patient ID: Anna Cuevas, female   DOB: [birth year], 77 y.o.   MRN: [id number]

## 2017-11-25 NOTE — Care Management Note (Addendum)
Case Management Note  Patient Details  Name: Anna Cuevas MRN: [id number] Date of Birth: [birth year]  Subjective/Objective:     Pt admitted with GI bleed - found to have GB tumor and presumed hepatic metastisis               Action/Plan:  PTA from home.  Pt admitted in March - refused SNF and therefore returned home with [facility] for RN and PT.  Pt already has following equipment in the home; rolling walker, cane, transfer/tub bench, grab bars, 3:1, hospital bed and wheelchair.    Expected Discharge Date:  11/26/17               Expected Discharge Plan:  [place]  In-House Referral:     Discharge planning Services  CM Consult  Post Acute Care Choice:    Choice offered to:     DME Arranged:    DME Agency:     HH Arranged:    HH Agency:     Status of Service:     If discussed at [employer], dates discussed:    Additional Comments:  Maryclare Labrador, RN 11/25/2017, 9:05 AM

## 2017-11-25 NOTE — Progress Notes (Signed)
Patient ID: Anna Cuevas, female   DOB: 1941/07/13, 77 y.o.   MRN: [id number]    2 Days Post-Op  Subjective: Pt with no new complaints.  Hasn't gotten out of bed since admission.  Objective: Vital signs in last 24 hours: Temp:  [98.1 F (36.7 C)-98.4 F (36.9 C)] 98.4 F (36.9 C) (05/08 0450) Pulse Rate:  [77-89] 89 (05/08 0450) Resp:  [18-26] 20 (05/08 0450) BP: (105-171)/(50-94) 157/91 (05/08 0450) SpO2:  [97 %-100 %] 100 % (05/08 0450) Last BM Date: 11/21/17  Intake/Output from previous day: 05/07 0701 - 05/08 0700 In: 2620.4 [I.V.:2160.4; NG/GT:60; IV Piggyback:400] Out: 1650 [Urine:500; Emesis/NG output:1150] Intake/Output this shift: No intake/output data recorded.  PE: Heart: regular Lungs: CTAB Abd: soft, NT, ND, NGT in place with minimal output this am. 1150cc out yesterday    Lab Results:  Recent Labs    11/24/17 0304 11/25/17 0327  WBC 7.5 7.6  HGB 7.8* 7.8*  HCT 24.3* 24.2*  PLT 252 253   BMET Recent Labs    11/24/17 0304 11/25/17 0327  NA 137 136  K 3.3* 3.0*  CL 103 102  CO2 23 23  GLUCOSE 99 91  BUN 13 7  CREATININE 0.70 0.74  CALCIUM 7.9* 7.9*   PT/INR Recent Labs    11/24/17 0304  LABPROT 14.6  INR 1.15   CMP     Component Value Date/Time   NA 136 11/25/2017 0327   K 3.0 (L) 11/25/2017 0327   CL 102 11/25/2017 0327   CO2 23 11/25/2017 0327   GLUCOSE 91 11/25/2017 0327   BUN 7 11/25/2017 0327   CREATININE 0.74 11/25/2017 0327   CALCIUM 7.9 (L) 11/25/2017 0327   PROT 4.8 (L) 11/25/2017 0327   ALBUMIN 2.5 (L) 11/25/2017 0327   AST 17 11/25/2017 0327   ALT 15 11/25/2017 0327   ALKPHOS 112 11/25/2017 0327   BILITOT 1.0 11/25/2017 0327   GFRNONAA >60 11/25/2017 0327   GFRAA >60 11/25/2017 0327   Lipase     Component Value Date/Time   LIPASE 52 (H) 11/21/2017 1715       Studies/Results: Ct Chest Wo Contrast  Result Date: 11/23/2017 CLINICAL DATA:  Cough, liver metastasis suspected. Patient denies chest pain and  dyspnea. EXAM: CT CHEST WITHOUT CONTRAST TECHNIQUE: Multidetector CT imaging of the chest was performed following the standard protocol without IV contrast. COMPARISON:  CT 07/08/2007, CXR 10/15/2017, CT abdomen and pelvis 11/21/2017 FINDINGS: Cardiovascular: Top-normal size heart without pericardial effusion or thickening. Left main in three-vessel coronary arteriosclerosis is noted. There is moderate thoracic aortic atherosclerosis without aneurysm. The main pulmonary artery is dilated up to 3.5 cm compatible with changes of chronic pulmonary hypertension. Mediastinum/Nodes: Calcified mediastinal lymph nodes compatible with old granulomatous disease. Patent trachea and mainstem bronchi. Gastric tube is seen coursing within the thoracic esophagus and extending into the moderately distended included stomach. Lungs/Pleura: Small left effusion with atelectasis. Scarring and atelectasis at the apices. Superimposed ground-glass opacities in both upper lobes, left greater than right likely representing areas of alveolitis/pneumonitis. No dominant mass is noted. There is no pneumothorax. Upper Abdomen: Small amount of perisplenic ascites. Previously noted ill-defined hepatic masses are less distinct on this unenhanced study. Gallbladder is not included on images provided. Musculoskeletal: No acute nor suspicious osseous abnormality. No aggressive osseous lesions. IMPRESSION: 1. Faint ground-glass infiltrates the upper lobes, left greater greater than right likely representing areas of alveolitis or pneumonitis. Superimposed atelectasis and/or scarring in both upper lobes. 2. No dominant pulmonary  mass is seen. Small left effusion with atelectasis. 3. Left main and three-vessel coronary arteriosclerosis. 15. Stigmata of old granulomatous disease with calcified mediastinal lymph nodes. 5. Small amount of perisplenic ascites. Previously noted hepatic lesions are difficult to identify on this unenhanced exam. Aortic  Atherosclerosis (ICD10-I70.0). Electronically Signed   By: Ashley Royalty M.D.   On: 11/23/2017 18:40   US Biopsy (liver)  Result Date: 11/24/2017 INDICATION: Liver lesion is concerning for metastatic disease, suspect gallbladder primary EXAM: ULTRASOUND GUIDED CORE BIOPSY OF THE RIGHT HEPATIC LESION MEDICATIONS: 1% LIDOCAINE LOCAL ANESTHESIA/SEDATION: Versed 1.'0mg'$  IV; Fentanyl 33mg IV; Moderate Sedation Time:  10 MINUTES The patient was continuously monitored during the procedure by the interventional radiology nurse under my direct supervision. FLUOROSCOPY TIME:  Fluoroscopy Time: None. COMPLICATIONS: None immediate. PROCEDURE: The procedure, risks, benefits, and alternatives were explained to the patient. Questions regarding the procedure were encouraged and answered. The patient understands and consents to the procedure. Previous imaging reviewed. Preliminary ultrasound performed. A hypoechoic solid lesion was localized in the right hepatic dome. Overlying skin marked for a right lateral intercostal approach. Under sterile conditions and local anesthesia, a 17 gauge 11.8 cm access needle was advanced percutaneously to the right hepatic lesion. Needle position confirmed with ultrasound. 18 gauge core biopsies obtained. Images obtained for documentation. Needle tract occluded with Gel-Foam. Postprocedure imaging demonstrates no hemorrhage or hematoma. Patient tolerated the biopsy well. FINDINGS: Ultrasound imaging confirms needle placement to the right hepatic lesion for core biopsy. IMPRESSION: Successful ultrasound right hepatic mass 18 gauge core biopsy Electronically Signed   By: MJerilynn Mages  Shick M.D.   On: 11/24/2017 13:58   [place] Site R[place]  Result Date: 11/24/2017 If Site Rite image not attached, placement could not be confirmed due to current cardiac rhythm.   Anti-infectives: Anti-infectives (From admission, onward)   None       Assessment/Plan Gastric outlet obstruction, likely secondary to  abnormal gallbladder with invasion of liver and duodenum -asked for path to put a rush on egd and liver biopsies.  Dr. WRedmond Pullingto discuss patient in cancer conference this morning.  ? Stent placement by GI to avoid a big operation on this patient who appears to have metastatic malignancy. -patient has laid in bed since Sunday.  Needs to ambulated and be in a chair more than the bed to avoid further deconditioning.  RN asked for PT consult. -cont NGT for decompression -cont to hold eliquis -CEA elevated at 20.6 -CA19-9 also elevated at 314 -CT of the chest negative for met disease -onc consult -will follow  Right hepatic liver masses x 3 Acute on chronic anemia Anticoagulated on eliquis -on hold DM HTN Choledocholithiasis -no biliary symptoms currently, may be incidental find per GI.  GI bleed PCM -now on TNA  FEN -NPO/NGT, TNA VTE -SCDs/ could have chemical prophylaxis from surgical standpoint as no intervention planned for today and patient higher risk due to malignancy. ID -none currently     LOS: 4 days    KHenreitta Cea, PGibson Community HospitalSurgery 11/25/2017, 7:38 AM Pager: [phone]

## 2017-11-25 NOTE — Progress Notes (Signed)
Initial Nutrition Assessment  DOCUMENTATION CODES:   Obesity unspecified  INTERVENTION:   -TPN management per pharmacy  NUTRITION DIAGNOSIS:   Inadequate oral intake related to altered GI function as evidenced by NPO status.  GOAL:   Patient will meet greater than or equal to 90% of their needs  MONITOR:   Labs, Weight trends, Skin, I & O's, Diet advancement  REASON FOR ASSESSMENT:   Consult New TPN/TNA  ASSESSMENT:   Anna Cuevas is a 76 y.o. female with medical history significant of atrial fibrillation on Eliquis, hypertension, diabetes mellitus, GERD, depression, who presents with hematemesis.  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 CT scan revealed hepatic metastasis as well as possible gastric outlet obstruction from focal narrowing and invasion of gallbladder into the first portion of the duodenum.   5/5- NGT placed 5/6- s/p EGD- revealed severe stenosis of duodenal bulb with possible malignant lesion 5/7- s/p liver mass bx  Reviewed I/O's: +970 ml x 24 hours and +7.5 L since admission. 1.2 L output via NGT.   Reviewed CWOCN note from 11/23/17; pt with stage 2 pressure injury to buttocks and healed pressure injury to buttocks.   Case discussed with RN, who confirms plan to initiate TPN and plan to place PICC in IR today. Pt unable to tolerate PICC placement by IV team; plan to place PICC under sedation with IR.   Spoke with pt at bedside, who reports she usually has a great appetite ("I eat 3 meals per day and always eat all of my food"). Her daughter lives closeby and is very supportive, often cooks her meals. However, pt has had periods of poor appetite over the past 3 weeks, which she attributes to bloating and constipation. She shares with this RD that she was vomiting a dark colored substance after drinking cola, however, was concerned it was fecal matter given history of constipation. She denies any weight loss, but endorses wt gain  secondary to abdominal distention. Reviewed wt hx; noted a 6.4% wt loss over the past month, which is significant for time frame.   Spent time with pt explaining need for TPN and rationale for NPO diet order related to NGT placement for suction. Discussed with pt how she would receive nutrition via TPN. Pt was a little forgetful at time of visit and tried to teachback multiple times for pt to better understand.   Per pharmacy note, plan to initiate TPN today 25 ml/hr (goal rate 70 ml/hr). TPN will provide 32g AA, 96g CHO and 14g ILE for a total of 594 kCal, meeting 33% of estimated kcal needs and 36% of estimated protein needs.   Per MD notes, will likely require palliative PEG.   Last Hgb A1c: 6.9 (10/20/17), which indicates good control. PTA DM medications 500 mg metformin daily.   Labs reviewed: K: 3.0, CBGS: WDL (inpatient orders for glycemic control are 0-9 units insulin aspart every 4 hours).   NUTRITION - FOCUSED PHYSICAL EXAM:    Most Recent Value  Orbital Region  No depletion  Upper Arm Region  Mild depletion  Thoracic and Lumbar Region  No depletion  Buccal Region  No depletion  Temple Region  Mild depletion  Clavicle Bone Region  No depletion  Clavicle and Acromion Bone Region  No depletion  Scapular Bone Region  No depletion  Dorsal Hand  No depletion  Patellar Region  No depletion  Anterior Thigh Region  No depletion  Posterior Calf Region  No depletion  Edema (RD  Assessment)  Mild  Hair  Reviewed  Eyes  Reviewed  Mouth  Reviewed  Skin  Reviewed  Nails  Reviewed       Diet Order:   Diet Order           Diet NPO time specified Except for: Sips with Meds  Diet effective midnight          EDUCATION NEEDS:   Education needs have been addressed  Skin:  Skin Assessment: Skin Integrity Issues: Skin Integrity Issues:: Incisions, Other (Comment), Stage II Stage II: buttocks Incisions: closed abdomen Other: healed pressure injury on buttocks  Last BM:   PTA  Height:   Ht Readings from Last 1 Encounters:  11/23/17 5\' 3"  (1.6 m)    Weight:   Wt Readings from Last 1 Encounters:  11/23/17 179 lb (81.2 kg)    Ideal Body Weight:  52.3 kg  BMI:  Body mass index is 31.71 kg/m.  Estimated Nutritional Needs:   Kcal:  1624-4695  Protein:  90-105 grams  Fluid:  > 1.7 L    Lonisha Bobby A. Jimmye Norman, RD, LDN, CDE Pager: [phone] After hours Pager: [phone]

## 2017-11-25 NOTE — Progress Notes (Addendum)
PROGRESS NOTE    Anna Cuevas  NAT:[id number] DOB: 1940/09/30 DOA: 11/21/2017 PCP: Kathyrn Lass, MD   Brief Narrative: Patient is a 77 year old female with past medical history of A. fib on Eliquis, hypertension, diabetes, GERD, depression who presented with hematemesis .She was recently admitted here 5 weeks ago and found to have new onset A. Fib  and was discharged on Eliquis.  Patient was having nausea, vomiting at home .Vomitus was dark-colored.  Patient underwent EGD on 5/6 and was found to have severe stenosis at  duodenal bulb. She underwent liver biopsy on 11/24/2017.  Assessment & Plan:   Principal Problem:   Gastric outlet obstruction with GB tumor Active Problems:   Atrial fibrillation with rapid ventricular response (HCC)   N&V (nausea and vomiting)   Diabetes mellitus without complication (HCC)   Pressure injury of skin   GIB (gastrointestinal bleeding)   Depression   Essential hypertension   GERD (gastroesophageal reflux disease)   Gallbladder carcinoma (HCC)   Blood loss anemia  Gastric outlet obstruction with gallbladder tumor: Patient unable to take by mouth.  On NG tube.  Plan on starting on TPN.  Will consult IR guided PICC line placement today. Underwent liver biopsy by IR on 11/24/2017.  Will consult oncology as soon as a tissue diagnosis available. Gastric outlet obstruction likely secondary to abnormal gallbladder findings with invasion of liver and duodenum. Might need G-tube/stenting.  Continue NG tube for decompression. Elevated CEA and CA19-9 CT abdomen showed:Heterogeneous, ill-defined gallbladder invading the adjacent liver, consistent with gallbladder carcinoma. Several hepatic metastases measuring up to 2.0 cm,severe distention of the stomach with gastric outlet obstruction secondary to focal narrowing and invasion of the first portion of the duodenum by the gallbladder mass.  Hematemesis/acute upper GI bleed: Bleeding has stopped.  Will avoid blood  thinners.  Acute blood loss anemia: Status post 3 units of PRBC.  Hemoglobin in  the range of 7.  We will continue to monitor H&H.  Nutrition: Urinary stricture  is very tight as per EGD.  Oral intake is not possible at this time.  Might need G-tube bypass.  Surgery following.  Hypokalemia/hypomagnesemia: Supplemented.  We will continue to monitor the levels  A. fib: Currently on normal sinus rhythm.  Rate controlled.  Not on anticoagulation due to hematemesis .  DM: We will continue to monitor blood sugars.  Hypertension: Currently blood pressure stable.  We will continue to monitor.    DVT prophylaxis: SCD Code Status: Full code Family Communication: Attempted to call daughter today,call not received,mail box full Disposition Plan:  needs to be determined.  Awaiting for liver biopsy report, oncology consult.  Long-term plan on nutrition.   Consultants: General surgery, GI  Procedures: Liver biopsy on 11/24/2017  Antimicrobials: None   Subjective: Patient seen and examined at bedside this morning.  Remains comfortable.  Denies any abdominal pain.  Objective: Vitals:   11/24/17 1911 11/24/17 2345 11/25/17 0450 11/25/17 0715  BP: (!) 161/55 (!) 161/62 (!) 157/91 (!) 164/68  Pulse: 81 79 89 80  Resp:  (!) 21 20 20   Temp: 98.4 F (36.9 C) 98.1 F (36.7 C) 98.4 F (36.9 C) 98.3 F (36.8 C)  TempSrc: Oral Oral Oral Oral  SpO2: 99% 97% 100% 99%  Weight:      Height:        Intake/Output Summary (Last 24 hours) at 11/25/2017 0830 Last data filed at 11/25/2017 0813 Gross per 24 hour  Intake 2620.42 ml  Output 1650 ml  Net 970.42 ml   Filed Weights   11/22/17 1200 11/23/17 1111  Weight: 81.4 kg (179 lb 7.3 oz) 81.2 kg (179 lb)    Examination:  General exam: Appears calm and comfortable ,Not in distress,average built HEENT:PERRL,Oral mucosa moist, Ear/Nose normal on gross exam Respiratory system: Bilateral equal air entry, normal vesicular breath sounds, no wheezes or  crackles  Cardiovascular system: S1 & S2 heard, RRR. No JVD, murmurs, rubs, gallops or clicks. No pedal edema. Gastrointestinal system: Abdomen is nondistended, soft and nontender. No organomegaly or masses felt. Normal bowel sounds heard.NG tube Central nervous system: Alert and oriented. No focal neurological deficits. Extremities: No edema, no clubbing ,no cyanosis, distal peripheral pulses palpable. Skin: No rashes, lesions or ulcers,no icterus ,no pallor MSK: Normal muscle bulk,tone ,power Psychiatry: Judgement and insight appear normal. Mood & affect appropriate.     Data Reviewed: I have personally reviewed following labs and imaging studies  CBC: Recent Labs  Lab 11/21/17 1715  11/22/17 1310 11/22/17 1754 11/23/17 0230 11/24/17 0304 11/25/17 0327  WBC 12.0*   < > 9.3 8.4 7.6 7.5 7.6  NEUTROABS 10.6*  --   --   --   --   --  5.9  HGB 7.5*   < > 7.0* 6.6* 7.5* 7.8* 7.8*  HCT 23.9*   < > 21.7* 20.6* 23.4* 24.3* 24.2*  MCV 80.2   < > 80.4 80.8 81.8 82.1 80.9  PLT 467*   < > 333 294 245 252 253   < > = values in this interval not displayed.   Basic Metabolic Panel: Recent Labs  Lab 11/21/17 1715  11/21/17 1742 11/22/17 0438 11/23/17 0230 11/24/17 0304 11/25/17 0327  NA 132*   < > 132* 134* 139 137 136  K 3.8   < > 4.1 3.9 3.2* 3.3* 3.0*  CL 94*   < > 94* 97* 103 103 102  CO2 24  --   --  25 25 23 23   GLUCOSE 240*   < > 227* 193* 133* 99 91  BUN 32*   < > 30* 34* 21* 13 7  CREATININE 1.17*   < > 0.90 1.02* 0.76 0.70 0.74  CALCIUM 8.6*  --   --  8.4* 8.0* 7.9* 7.9*  MG  --   --   --   --   --   --  1.1*  PHOS  --   --   --   --   --   --  2.8   < > = values in this interval not displayed.   GFR: Estimated Creatinine Clearance: 59.4 mL/min (by C-G formula based on SCr of 0.74 mg/dL). Liver Function Tests: Recent Labs  Lab 11/21/17 1715 11/24/17 0304 11/25/17 0327  AST 20 15 17   ALT 22 14 15   ALKPHOS 132* 102 112  BILITOT 0.7 0.7 1.0  PROT 6.1* 4.6* 4.8*    ALBUMIN 3.3* 2.4* 2.5*   Recent Labs  Lab 11/21/17 1715  LIPASE 52*   No results for input(s): AMMONIA in the last 168 hours. Coagulation Profile: Recent Labs  Lab 11/21/17 1715 11/21/17 2352 11/24/17 0304  INR 1.33 1.30 1.15   Cardiac Enzymes: No results for input(s): CKTOTAL, CKMB, CKMBINDEX, TROPONINI in the last 168 hours. BNP (last 3 results) No results for input(s): PROBNP in the last 8760 hours. HbA1C: No results for input(s): HGBA1C in the last 72 hours. CBG: Recent Labs  Lab 11/24/17 1726 11/24/17 2120 11/25/17 0040 11/25/17 0453 11/25/17 0347  GLUCAP 96 89 88 86 93   Lipid Profile: Recent Labs    11/25/17 0327  TRIG 148   Thyroid Function Tests: No results for input(s): TSH, T4TOTAL, FREET4, T3FREE, THYROIDAB in the last 72 hours. Anemia Panel: No results for input(s): VITAMINB12, FOLATE, FERRITIN, TIBC, IRON, RETICCTPCT in the last 72 hours. Sepsis Labs: Recent Labs  Lab 11/21/17 1742  LATICACIDVEN 1.15    Recent Results (from the past 240 hour(s))  Culture, blood (Routine X 2) w Reflex to ID Panel     Status: None (Preliminary result)   Collection Time: 11/22/17  6:05 AM  Result Value Ref Range Status   Specimen Description BLOOD LEFT HAND  Final   Special Requests   Final    BOTTLES DRAWN AEROBIC AND ANAEROBIC Blood Culture adequate volume   Culture   Final    NO GROWTH 2 DAYS Performed at [facility], [place] [address], [place], [place] [address]    Report Status PENDING  Incomplete  Culture, blood (Routine X 2) w Reflex to ID Panel     Status: None (Preliminary result)   Collection Time: 11/22/17  6:13 AM  Result Value Ref Range Status   Specimen Description BLOOD LEFT ANTECUBITAL  Final   Special Requests   Final    BOTTLES DRAWN AEROBIC AND ANAEROBIC Blood Culture adequate volume   Culture   Final    NO GROWTH 2 DAYS Performed at [facility], [place] [address], [place], [place] [address]    Report Status PENDING   Incomplete  MRSA PCR Screening     Status: None   Collection Time: 11/22/17  1:44 PM  Result Value Ref Range Status   MRSA by PCR NEGATIVE NEGATIVE Final    Comment:        The GeneXpert MRSA Assay (FDA approved for NASAL specimens only), is one component of a comprehensive MRSA colonization surveillance program. It is not intended to diagnose MRSA infection nor to guide or monitor treatment for MRSA infections. Performed at [facility], [place] [address], [place], [place] [address]          Radiology Studies: Ct Chest Wo Contrast  Result Date: 11/23/2017 CLINICAL DATA:  Cough, liver metastasis suspected. Patient denies chest pain and dyspnea. EXAM: CT CHEST WITHOUT CONTRAST TECHNIQUE: Multidetector CT imaging of the chest was performed following the standard protocol without IV contrast. COMPARISON:  CT 07/08/2007, CXR 10/15/2017, CT abdomen and pelvis 11/21/2017 FINDINGS: Cardiovascular: Top-normal size heart without pericardial effusion or thickening. Left main in three-vessel coronary arteriosclerosis is noted. There is moderate thoracic aortic atherosclerosis without aneurysm. The main pulmonary artery is dilated up to 3.5 cm compatible with changes of chronic pulmonary hypertension. Mediastinum/Nodes: Calcified mediastinal lymph nodes compatible with old granulomatous disease. Patent trachea and mainstem bronchi. Gastric tube is seen coursing within the thoracic esophagus and extending into the moderately distended included stomach. Lungs/Pleura: Small left effusion with atelectasis. Scarring and atelectasis at the apices. Superimposed ground-glass opacities in both upper lobes, left greater than right likely representing areas of alveolitis/pneumonitis. No dominant mass is noted. There is no pneumothorax. Upper Abdomen: Small amount of perisplenic ascites. Previously noted ill-defined hepatic masses are less distinct on this unenhanced study. Gallbladder is not included on images  provided. Musculoskeletal: No acute nor suspicious osseous abnormality. No aggressive osseous lesions. IMPRESSION: 1. Faint ground-glass infiltrates the upper lobes, left greater greater than right likely representing areas of alveolitis or pneumonitis. Superimposed atelectasis and/or scarring in  both upper lobes. 2. No dominant pulmonary mass is seen. Small left effusion with atelectasis. 3. Left main and three-vessel coronary arteriosclerosis. 60. Stigmata of old granulomatous disease with calcified mediastinal lymph nodes. 5. Small amount of perisplenic ascites. Previously noted hepatic lesions are difficult to identify on this unenhanced exam. Aortic Atherosclerosis (ICD10-I70.0). Electronically Signed   By: Ashley Royalty M.D.   On: 11/23/2017 18:40   US Biopsy (liver)  Result Date: 11/24/2017 INDICATION: Liver lesion is concerning for metastatic disease, suspect gallbladder primary EXAM: ULTRASOUND GUIDED CORE BIOPSY OF THE RIGHT HEPATIC LESION MEDICATIONS: 1% LIDOCAINE LOCAL ANESTHESIA/SEDATION: Versed 1.0mg  IV; Fentanyl 29mcg IV; Moderate Sedation Time:  10 MINUTES The patient was continuously monitored during the procedure by the interventional radiology nurse under my direct supervision. FLUOROSCOPY TIME:  Fluoroscopy Time: None. COMPLICATIONS: None immediate. PROCEDURE: The procedure, risks, benefits, and alternatives were explained to the patient. Questions regarding the procedure were encouraged and answered. The patient understands and consents to the procedure. Previous imaging reviewed. Preliminary ultrasound performed. A hypoechoic solid lesion was localized in the right hepatic dome. Overlying skin marked for a right lateral intercostal approach. Under sterile conditions and local anesthesia, a 17 gauge 11.8 cm access needle was advanced percutaneously to the right hepatic lesion. Needle position confirmed with ultrasound. 18 gauge core biopsies obtained. Images obtained for documentation. Needle  tract occluded with Gel-Foam. Postprocedure imaging demonstrates no hemorrhage or hematoma. Patient tolerated the biopsy well. FINDINGS: Ultrasound imaging confirms needle placement to the right hepatic lesion for core biopsy. IMPRESSION: Successful ultrasound right hepatic mass 18 gauge core biopsy Electronically Signed   By: Jerilynn Mages.  Shick M.D.   On: 11/24/2017 13:58   [place] Ekg Site Rite  Result Date: 11/24/2017 If Site Rite image not attached, placement could not be confirmed due to current cardiac rhythm.       Scheduled Meds: . insulin aspart  0-9 Units Subcutaneous Q4H  . mouth rinse  15 mL Mouth Rinse BID  . metoprolol tartrate  5 mg Intravenous Q6H   Continuous Infusions: . sodium chloride 75 mL/hr at 11/24/17 1900  . magnesium sulfate 1 - 4 g bolus IVPB 2 g (11/25/17 0813)  . potassium chloride    . potassium PHOSPHATE IVPB (mmol)    . TPN ADULT (ION)       LOS: 4 days    Time spent: 35 mins.More than 50% of that time was spent in counseling and/or coordination of care.      Shelly Coss, MD [facility] Pager [phone]  If 7PM-7AM, please contact night-coverage [email or web address] Password [email or web address] 11/25/2017, 8:30 AM

## 2017-11-25 NOTE — Progress Notes (Signed)
PHARMACY - ADULT TOTAL PARENTERAL NUTRITION CONSULT NOTE   Pharmacy Consult:  TPN Indication:  GOO  Patient Measurements: Height: 5\' 3"  (160 cm) Weight: 179 lb (81.2 kg) IBW/kg (Calculated) : 52.4 TPN AdjBW (KG): 59.6 Body mass index is 31.71 kg/m.  Assessment:  72 YOF presented on 11/21/17 with hematemesis.  Patient was hospitalized 5 weeks ago d/t N/V and continued to have those symptoms post discharge.  CT concerning for GOO and gallbladder carcinoma with hepatic mets as well as choledocholithiasis.  She is s/p EGD with duodenal biopsy on 5/6 and liver biopsy on 11/24/17.  Given expected prolonged NPO status and while work-up is in progress, Pharmacy consulted to initiate TPN.  Patient reported that she gained weight instead of losing weight, and was eating about 50% of her meals for 2 weeks PTA. She is at risk for refeeding syndrome.  GI: baseline prealbumin low at 10, NG O/P 115mL - Protonix gtt Endo: DM on metformin PTA - CBGs low normal Insulin requirements in the past 24 hours: 0 unit SSI  Lytes: K 3 (5 runs ordered), Mag 1.1 (2gm ordered), iCa slightly low on 5/4, others low normal Renal: SCr 0.74 stable, BUN WNL - UOP 0.7 ml/kg/hr, NS at 750/hr Pulm: stable on RA Cards: HTN - BP elevated, HR controlled - IV metoprolol, PRN hydralazine AC: Eliquis PTA for Afib dx in March, s/p KCentra on 5/4 - hgb low at 7.8, plts WNL Hepatobil: LFTs / tbili / TG WNL Neuro: A&O - PRN APAP ID: afebrile, WBC WNL - not on abx TPN Access: PICC to be placed 5/8 TPN start date: 11/25/17  Nutritional Goals (RD rec pending): 1600-1700 kCal and 85-100 gm protein per day, fluid >2L   Current Nutrition:  NPO   Plan:  Initiate TPN at 25 ml/hr (goal rate 70 ml/hr) TPN will provide 32g AA, 96g CHO and 14g ILE for a total of 594 kCal, meeting 36% of patient's needs. Electrolytes in TPN: increase Na/K/Phos/Mag, standard calcium, Cl:Ac 1:1 Daily multivitamin and trace elements in TPN Continue sensitive  SSI Q4H KPhos 15 mmol IV x 1 NS at 75 ml/hr - reduce when TPN provides adequate volume F/U AM labs   Anna Cuevas D. Mina Marble, PharmD, BCPS, BCCCP Pager:  [phone] 11/25/2017, 8:08 AM

## 2017-11-25 NOTE — Progress Notes (Addendum)
-   patient not seen. Was probably getting a PICC line by interventional radiology. - duodenal biopsy positive for adenocarcinoma that  transition from benign duodenal mucosa without any dysplasia. Findings concerning for extension of adenocarcinoma from adjacent site. Primary duodenal adenocarcinoma and metastases less likely. - case discussed with surgery attending Dr. Redmond Pulling in the morning. - During EGD on 05/06 ,  I was not able to find any opening beyond duodenal bulb to consider duodenal stent placement for palliative purposes. - plan for upper GI series to see if any contrast able to pass through duodenal stenosis.  - recommend oncology consultation. - Follow liver biopsy results. - GI will follow  Otis Brace MD, [place] 11/25/2017, 3:18 PM  Contact #  [phone]

## 2017-11-25 NOTE — Care Management Important Message (Signed)
Important Message  Patient Details  Name: Anna Cuevas MRN: [id number] Date of Birth: February 01, 1941   Medicare Important Message Given:  Yes    Demaurion Dicioccio P Arjay Jaskiewicz 11/25/2017, 3:47 PM

## 2017-11-26 ENCOUNTER — Encounter (HOSPITAL_COMMUNITY): Payer: PPO

## 2017-11-26 ENCOUNTER — Inpatient Hospital Stay (HOSPITAL_COMMUNITY): Payer: PPO

## 2017-11-26 DIAGNOSIS — Z7901 Long term (current) use of anticoagulants: Secondary | ICD-10-CM

## 2017-11-26 DIAGNOSIS — C787 Secondary malignant neoplasm of liver and intrahepatic bile duct: Secondary | ICD-10-CM

## 2017-11-26 DIAGNOSIS — K311 Adult hypertrophic pyloric stenosis: Secondary | ICD-10-CM

## 2017-11-26 DIAGNOSIS — I4891 Unspecified atrial fibrillation: Secondary | ICD-10-CM

## 2017-11-26 DIAGNOSIS — C784 Secondary malignant neoplasm of small intestine: Secondary | ICD-10-CM

## 2017-11-26 DIAGNOSIS — C23 Malignant neoplasm of gallbladder: Principal | ICD-10-CM

## 2017-11-26 DIAGNOSIS — K920 Hematemesis: Secondary | ICD-10-CM

## 2017-11-26 DIAGNOSIS — Z978 Presence of other specified devices: Secondary | ICD-10-CM

## 2017-11-26 LAB — CBC WITH DIFFERENTIAL/PLATELET
BASOS ABS: 0 10*3/uL (ref 0.0–0.1)
Basophils Relative: 0 %
EOS PCT: 1 %
Eosinophils Absolute: 0.1 10*3/uL (ref 0.0–0.7)
HCT: 25.6 % — ABNORMAL LOW (ref 36.0–46.0)
HEMOGLOBIN: 8.2 g/dL — AB (ref 12.0–15.0)
LYMPHS ABS: 0.8 10*3/uL (ref 0.7–4.0)
LYMPHS PCT: 10 %
MCH: 25.7 pg — ABNORMAL LOW (ref 26.0–34.0)
MCHC: 32 g/dL (ref 30.0–36.0)
MCV: 80.3 fL (ref 78.0–100.0)
Monocytes Absolute: 0.6 10*3/uL (ref 0.1–1.0)
Monocytes Relative: 7 %
NEUTROS PCT: 82 %
Neutro Abs: 6.8 10*3/uL (ref 1.7–7.7)
PLATELETS: 296 10*3/uL (ref 150–400)
RBC: 3.19 MIL/uL — AB (ref 3.87–5.11)
RDW: 14.7 % (ref 11.5–15.5)
WBC: 8.3 10*3/uL (ref 4.0–10.5)

## 2017-11-26 LAB — GLUCOSE, CAPILLARY
GLUCOSE-CAPILLARY: 187 mg/dL — AB (ref 65–99)
Glucose-Capillary: 160 mg/dL — ABNORMAL HIGH (ref 65–99)
Glucose-Capillary: 162 mg/dL — ABNORMAL HIGH (ref 65–99)
Glucose-Capillary: 168 mg/dL — ABNORMAL HIGH (ref 65–99)
Glucose-Capillary: 169 mg/dL — ABNORMAL HIGH (ref 65–99)
Glucose-Capillary: 193 mg/dL — ABNORMAL HIGH (ref 65–99)
Glucose-Capillary: 203 mg/dL — ABNORMAL HIGH (ref 65–99)

## 2017-11-26 LAB — COMPREHENSIVE METABOLIC PANEL
ALBUMIN: 2.5 g/dL — AB (ref 3.5–5.0)
ALT: 15 U/L (ref 14–54)
AST: 16 U/L (ref 15–41)
Alkaline Phosphatase: 116 U/L (ref 38–126)
Anion gap: 12 (ref 5–15)
BUN: 5 mg/dL — AB (ref 6–20)
CHLORIDE: 99 mmol/L — AB (ref 101–111)
CO2: 25 mmol/L (ref 22–32)
CREATININE: 0.66 mg/dL (ref 0.44–1.00)
Calcium: 8.1 mg/dL — ABNORMAL LOW (ref 8.9–10.3)
GFR calc Af Amer: >60 mL/min (ref >60)
GFR calc non Af Amer: >60 mL/min (ref >60)
GLUCOSE: 184 mg/dL — AB (ref 65–99)
POTASSIUM: 3.5 mmol/L (ref 3.5–5.1)
SODIUM: 136 mmol/L (ref 135–145)
Total Bilirubin: 0.8 mg/dL (ref 0.3–1.2)
Total Protein: 5.2 g/dL — ABNORMAL LOW (ref 6.5–8.1)

## 2017-11-26 LAB — PHOSPHORUS: Phosphorus: 2.8 mg/dL (ref 2.5–4.6)

## 2017-11-26 LAB — MAGNESIUM: Magnesium: 1.6 mg/dL — ABNORMAL LOW (ref 1.7–2.4)

## 2017-11-26 MED ORDER — POTASSIUM PHOSPHATES 15 MMOLE/5ML IV SOLN
15.0000 mmol | Freq: Once | INTRAVENOUS | Status: AC
  Administered 2017-11-26: 15 mmol via INTRAVENOUS
  Filled 2017-11-26: qty 5

## 2017-11-26 MED ORDER — MAGNESIUM SULFATE 2 GM/50ML IV SOLN
2.0000 g | Freq: Once | INTRAVENOUS | Status: AC
  Administered 2017-11-26: 2 g via INTRAVENOUS
  Filled 2017-11-26: qty 50

## 2017-11-26 MED ORDER — POTASSIUM CHLORIDE 10 MEQ/100ML IV SOLN
10.0000 meq | INTRAVENOUS | Status: AC
  Administered 2017-11-26 (×5): 10 meq via INTRAVENOUS
  Filled 2017-11-26 (×5): qty 100

## 2017-11-26 MED ORDER — IOHEXOL 300 MG/ML  SOLN
150.0000 mL | Freq: Once | INTRAMUSCULAR | Status: AC | PRN
  Administered 2017-11-26: 210 mL via ORAL

## 2017-11-26 MED ORDER — SODIUM CHLORIDE 0.9 % IV SOLN
INTRAVENOUS | Status: AC
  Administered 2017-11-27 (×2): via INTRAVENOUS

## 2017-11-26 MED ORDER — TRAVASOL 10 % IV SOLN
INTRAVENOUS | Status: AC
  Administered 2017-11-26: 18:00:00 via INTRAVENOUS
  Filled 2017-11-26: qty 583.2

## 2017-11-26 MED ORDER — INSULIN ASPART 100 UNIT/ML ~~LOC~~ SOLN
0.0000 [IU] | SUBCUTANEOUS | Status: DC
  Administered 2017-11-26 (×3): 3 [IU] via SUBCUTANEOUS
  Administered 2017-11-26 – 2017-11-27 (×2): 5 [IU] via SUBCUTANEOUS
  Administered 2017-11-27 – 2017-11-28 (×6): 3 [IU] via SUBCUTANEOUS
  Administered 2017-11-28: 2 [IU] via SUBCUTANEOUS
  Administered 2017-11-28: 5 [IU] via SUBCUTANEOUS
  Administered 2017-11-28: 3 [IU] via SUBCUTANEOUS
  Administered 2017-11-28: 2 [IU] via SUBCUTANEOUS
  Administered 2017-11-29: 8 [IU] via SUBCUTANEOUS
  Administered 2017-11-29 (×2): 2 [IU] via SUBCUTANEOUS
  Administered 2017-11-29: 3 [IU] via SUBCUTANEOUS
  Administered 2017-11-29: 2 [IU] via SUBCUTANEOUS
  Administered 2017-11-29 – 2017-11-30 (×2): 5 [IU] via SUBCUTANEOUS
  Administered 2017-11-30 – 2017-12-01 (×7): 3 [IU] via SUBCUTANEOUS

## 2017-11-26 MED ORDER — IOPAMIDOL (ISOVUE-300) INJECTION 61%: 150.0000 mL | Freq: Once | INTRAVENOUS | Status: DC | PRN

## 2017-11-26 NOTE — Progress Notes (Signed)
   Acute Care Surgery Service Progress Note:    I was able to reach the daughter on the phone. Confirmed that they are interested in home with home hospice Therefore I believe there is really no role for palliative bypass with G and J tubes since this could be a morbid procedure  Therefore I recommend either GI medicine or interventional radiology placing a gastrostomy tube so she can be discharged with the G-tube so she can vent that as needed for her gastric outlet obstruction  Answered daughter's questions Signing off  Leighton Ruff. Redmond Pulling, MD, FACS General, Bariatric, & Minimally Invasive Surgery [phone] [facility]

## 2017-11-26 NOTE — Progress Notes (Signed)
[facility] Gastroenterology Progress Note  HENNESSY BARTEL 77 y.o. May 12, 1941  CC:  Gastric outlet obstruction, nausea and vomiting. Possible metastatic gallbladder carcinoma   Subjective:  Resting  comfortable in the recliner.continues to have abdominal discomfort. NG suction in place.   Objective: Vital signs in last 24 hours: Vitals:   11/26/17 0700 11/26/17 0757  BP:  (!) 169/78  Pulse: 80   Resp: (!) 23 (!) 22  Temp:  98.4 F (36.9 C)  SpO2: 98%     Physical Exam:  Gen. Alert and oriented 3. Some discomfort from NG tube. Abdomen. Soft, nontender, nondistended, bowel sounds present.  Lab Results: Recent Labs    11/25/17 0327 11/26/17 0524  NA 136 136  K 3.0* 3.5  CL 102 99*  CO2 23 25  GLUCOSE 91 184*  BUN 7 5*  CREATININE 0.74 0.66  CALCIUM 7.9* 8.1*  MG 1.1* 1.6*  PHOS 2.8 2.8   Recent Labs    11/25/17 0327 11/26/17 0524  AST 17 16  ALT 15 15  ALKPHOS 112 116  BILITOT 1.0 0.8  PROT 4.8* 5.2*  ALBUMIN 2.5* 2.5*   Recent Labs    11/25/17 0327 11/26/17 0952  WBC 7.6 8.3  NEUTROABS 5.9 6.8  HGB 7.8* 8.2*  HCT 24.2* 25.6*  MCV 80.9 80.3  PLT 253 296   Recent Labs    11/24/17 0304  LABPROT 14.6  INR 1.15      Assessment/Plan: - gastric outlet obstruction was likely from metastatic gallbladder carcinoma. EGD  showed duodenal bulb stenosis.  Biopsies positive for adenocarcinoma. Liver biopsy also positive for adenocarcinoma with pancreaticobiliary  morphology. - acute and chronic anemia. - choledocholithiasis with 3 mm stone into the common bile duct. Most likely incidental finding - Atrial fibrillation. Last dose of Eliquis 05/03 evening      Recommendations -------------------------- - upper GI series today showed complete gastric outlet obstruction.no contrast was seen passing through the duodenum. images reviewed personally. - findings discussed with the patient as well as patient's daughter over the phone.  - Unfortunately, because  of complete obstruction, we will not be able to place duodenal stent for palliative purposes. - oncology to see patient today. - I will D/W Dr. Redmond Pulling as well as with Dr. Watt Climes.    Otis Brace MD, [place] 11/26/2017, 10:46 AM  Contact #  [phone]

## 2017-11-26 NOTE — Plan of Care (Signed)
  Problem: Clinical Measurements: Goal: Ability to maintain clinical measurements within normal limits will improve Outcome: Not Progressing as evident by electrolyte imbalance Problem: Education: Goal: Knowledge of General Education information will improve Outcome: Progressing

## 2017-11-26 NOTE — Progress Notes (Signed)
PROGRESS NOTE    Anna Cuevas  BJS:[id number] DOB: [birth year] DOA: 11/21/2017 PCP: Kathyrn Lass, MD   Brief Narrative: Patient is a 77 year old female with past medical history of A. fib on Eliquis, hypertension, diabetes, GERD, depression who presented with hematemesis .She was recently admitted here 5 weeks ago and found to have new onset A. Fib  and was discharged on Eliquis.  Patient was having nausea, vomiting at home .Vomitus was dark-colored.  Patient underwent EGD on 5/6 and was found to have severe stenosis at  duodenal bulb.CT scan done here showed gallbladder mass, hepatic metastasis, severe distention of the stomach with gastric outlet obstruction. She underwent liver biopsy on 11/24/2017.  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  Oncology consulted today.  Assessment & Plan:   Principal Problem:   Gastric outlet obstruction with GB tumor Active Problems:   Atrial fibrillation with rapid ventricular response (HCC)   N&V (nausea and vomiting)   Diabetes mellitus without complication (HCC)   Pressure injury of skin   GIB (gastrointestinal bleeding)   Depression   Essential hypertension   GERD (gastroesophageal reflux disease)   Gallbladder carcinoma (HCC)   Blood loss anemia  Gastric outlet obstruction with gallbladder tumor/liver mets:  Oncology consulted today.  Plan is to involve radiation oncology. Patient unable to take by mouth.  On NG tube. On TPN.  Underwent liver biopsy by IR on 11/24/2017. Pathology from endoscopy and liver biopsy  confirmed adenocarcinoma most likely from the hepatobiliary source likely gallbladder. Gastric outlet obstruction  secondary to gall bladder mass with invasion of liver and duodenum. Needs surgery vs stenting.  Continue NG tube for decompression.  Upper GI series shows complete gastric outlet obstruction so  stenting not possible as per  GI. Elevated CEA and CA19-9 CT abdomen showed:Heterogeneous, ill-defined gallbladder invading the adjacent liver, consistent with gallbladder carcinoma. Several hepatic metastases measuring up to 2.0 cm,severe distention of the stomach with gastric outlet obstruction secondary to focal narrowing and invasion of the first portion of the duodenum by the gallbladder mass.  Hematemesis/acute upper GI bleed: Bleeding has stopped.  Will avoid blood thinners.Eliquis stopped.  Acute blood loss anemia: Status post 3 units of PRBC.  Hemoglobin in  the range of 8.  We will continue to monitor H&H.  Nutrition: Gastric stricture  is very tight as per EGD.  Oral intake is not possible at this time. Surgery following.Surgery vs Stenting.  Hypokalemia/hypomagnesemia: Supplemented.  We will continue to monitor the levels  A. fib: Currently on normal sinus rhythm.  Rate controlled.  Not on anticoagulation due to hematemesis .  DM: We will continue to monitor blood sugars.  Hypertension: Currently blood pressure stable.  We will continue to monitor.    DVT prophylaxis: SCD Code Status: Full code Family Communication: Discussed with daughter on phone Disposition Plan:  needs to be determined.  Awaiting  management of gastric outlet obstruction, oncology consult.  Long-term plan on nutrition.   Consultants: General surgery, GI  Procedures: Liver biopsy on 11/24/2017  Antimicrobials: None   Subjective: Patient seen and examined at bedside this morning.  No new issues/events.  Looks comfortable.  Denies pain.  Objective: Vitals:   11/26/17 0600 11/26/17 0700 11/26/17 0757 11/26/17 1154  BP:   (!) 169/78 (!) 158/66  Pulse:  80  86  Resp: 19 (!) 23 (!) 22 (!) 25  Temp:   98.4 F (36.9 C) 98.3 F (36.8 C)  TempSrc:   Oral Oral  SpO2:  98%  98%  Weight:      Height:        Intake/Output Summary (Last 24 hours) at 11/26/2017 1218 Last data filed at 11/26/2017 1155 Gross per 24 hour  Intake 400 ml   Output 2350 ml  Net -1950 ml   Filed Weights   11/22/17 1200 11/23/17 1111  Weight: 81.4 kg (179 lb 7.3 oz) 81.2 kg (179 lb)    Examination:  General exam: Appears calm and comfortable ,Not in distress,average built HEENT:PERRL,Oral mucosa moist, Ear/Nose normal on gross exam Respiratory system: Bilateral equal air entry, normal vesicular breath sounds, no wheezes or crackles  Cardiovascular system: S1 & S2 heard, RRR. No JVD, murmurs, rubs, gallops or clicks. Gastrointestinal system: Abdomen is nondistended, soft and nontender. No organomegaly or masses felt. Normal bowel sounds heard.NG tube Central nervous system: Alert and oriented. No focal neurological deficits. Extremities: No edema, no clubbing ,no cyanosis, distal peripheral pulses palpable. Skin: No rashes, lesions or ulcers,no icterus ,no pallor   Data Reviewed: I have personally reviewed following labs and imaging studies  CBC: Recent Labs  Lab 11/21/17 1715  11/22/17 1754 11/23/17 0230 11/24/17 0304 11/25/17 0327 11/26/17 0952  WBC 12.0*   < > 8.4 7.6 7.5 7.6 8.3  NEUTROABS 10.6*  --   --   --   --  5.9 6.8  HGB 7.5*   < > 6.6* 7.5* 7.8* 7.8* 8.2*  HCT 23.9*   < > 20.6* 23.4* 24.3* 24.2* 25.6*  MCV 80.2   < > 80.8 81.8 82.1 80.9 80.3  PLT 467*   < > 294 245 252 253 296   < > = values in this interval not displayed.   Basic Metabolic Panel: Recent Labs  Lab 11/22/17 0438 11/23/17 0230 11/24/17 0304 11/25/17 0327 11/26/17 0524  NA 134* 139 137 136 136  K 3.9 3.2* 3.3* 3.0* 3.5  CL 97* 103 103 102 99*  CO2 25 25 23 23 25   GLUCOSE 193* 133* 99 91 184*  BUN 34* 21* 13 7 5*  CREATININE 1.02* 0.76 0.70 0.74 0.66  CALCIUM 8.4* 8.0* 7.9* 7.9* 8.1*  MG  --   --   --  1.1* 1.6*  PHOS  --   --   --  2.8 2.8   GFR: Estimated Creatinine Clearance: 59.4 mL/min (by C-G formula based on SCr of 0.66 mg/dL). Liver Function Tests: Recent Labs  Lab 11/21/17 1715 11/24/17 0304 11/25/17 0327 11/26/17 0524   AST 20 15 17 16   ALT 22 14 15 15   ALKPHOS 132* 102 112 116  BILITOT 0.7 0.7 1.0 0.8  PROT 6.1* 4.6* 4.8* 5.2*  ALBUMIN 3.3* 2.4* 2.5* 2.5*   Recent Labs  Lab 11/21/17 1715  LIPASE 52*   No results for input(s): AMMONIA in the last 168 hours. Coagulation Profile: Recent Labs  Lab 11/21/17 1715 11/21/17 2352 11/24/17 0304  INR 1.33 1.30 1.15   Cardiac Enzymes: No results for input(s): CKTOTAL, CKMB, CKMBINDEX, TROPONINI in the last 168 hours. BNP (last 3 results) No results for input(s): PROBNP in the last 8760 hours. HbA1C: No results for input(s): HGBA1C in the last 72 hours. CBG: Recent Labs  Lab 11/25/17 2033 11/26/17 0049 11/26/17 0525 11/26/17 0802 11/26/17 1217  GLUCAP 141* 162* 168* 169* 187*   Lipid Profile: Recent Labs    11/25/17 0327  TRIG 148   Thyroid Function Tests: No results for input(s): TSH, T4TOTAL, FREET4, T3FREE,  THYROIDAB in the last 72 hours. Anemia Panel: No results for input(s): VITAMINB12, FOLATE, FERRITIN, TIBC, IRON, RETICCTPCT in the last 72 hours. Sepsis Labs: Recent Labs  Lab 11/21/17 1742  LATICACIDVEN 1.15    Recent Results (from the past 240 hour(s))  Culture, blood (Routine X 2) w Reflex to ID Panel     Status: None (Preliminary result)   Collection Time: 11/22/17  6:05 AM  Result Value Ref Range Status   Specimen Description BLOOD LEFT HAND  Final   Special Requests   Final    BOTTLES DRAWN AEROBIC AND ANAEROBIC Blood Culture adequate volume   Culture   Final    NO GROWTH 3 DAYS Performed at [facility], [place] [address], [place], [place] [address]    Report Status PENDING  Incomplete  Culture, blood (Routine X 2) w Reflex to ID Panel     Status: None (Preliminary result)   Collection Time: 11/22/17  6:13 AM  Result Value Ref Range Status   Specimen Description BLOOD LEFT ANTECUBITAL  Final   Special Requests   Final    BOTTLES DRAWN AEROBIC AND ANAEROBIC Blood Culture adequate volume   Culture   Final     NO GROWTH 3 DAYS Performed at [facility], [place] [address], [place], [place] [address]    Report Status PENDING  Incomplete  MRSA PCR Screening     Status: None   Collection Time: 11/22/17  1:44 PM  Result Value Ref Range Status   MRSA by PCR NEGATIVE NEGATIVE Final    Comment:        The GeneXpert MRSA Assay (FDA approved for NASAL specimens only), is one component of a comprehensive MRSA colonization surveillance program. It is not intended to diagnose MRSA infection nor to guide or monitor treatment for MRSA infections. Performed at [facility], [place] [address], [place],  [address]          Radiology Studies: US Biopsy (liver)  Result Date: 11/24/2017 INDICATION: Liver lesion is concerning for metastatic disease, suspect gallbladder primary EXAM: ULTRASOUND GUIDED CORE BIOPSY OF THE RIGHT HEPATIC LESION MEDICATIONS: 1% LIDOCAINE LOCAL ANESTHESIA/SEDATION: Versed 1.0mg  IV; Fentanyl 72mcg IV; Moderate Sedation Time:  10 MINUTES The patient was continuously monitored during the procedure by the interventional radiology nurse under my direct supervision. FLUOROSCOPY TIME:  Fluoroscopy Time: None. COMPLICATIONS: None immediate. PROCEDURE: The procedure, risks, benefits, and alternatives were explained to the patient. Questions regarding the procedure were encouraged and answered. The patient understands and consents to the procedure. Previous imaging reviewed. Preliminary ultrasound performed. A hypoechoic solid lesion was localized in the right hepatic dome. Overlying skin marked for a right lateral intercostal approach. Under sterile conditions and local anesthesia, a 17 gauge 11.8 cm access needle was advanced percutaneously to the right hepatic lesion. Needle position confirmed with ultrasound. 18 gauge core biopsies obtained. Images obtained for documentation. Needle tract occluded with Gel-Foam. Postprocedure imaging demonstrates no hemorrhage or hematoma.  Patient tolerated the biopsy well. FINDINGS: Ultrasound imaging confirms needle placement to the right hepatic lesion for core biopsy. IMPRESSION: Successful ultrasound right hepatic mass 18 gauge core biopsy Electronically Signed   By: Jerilynn Mages.  Shick M.D.   On: 11/24/2017 13:58   Dg Duanne Limerick W/water Sol Cm  Result Date: 11/26/2017 CLINICAL DATA:  Gastric outlet obstruction. EXAM: WATER SOLUBLE UPPER GI SERIES TECHNIQUE: Single-column upper GI series was performed using water soluble contrast. CONTRAST:  210 cc of Omnipaque 300 injected through nasogastric tube.  COMPARISON:  Body CT 11/21/2017 FLUOROSCOPY TIME:  Fluoroscopy Time:  42 seconds FINDINGS: Initial radiograph of the abdomen demonstrates distension of the stomach to the level of the gastric outlet. Enteric catheter terminates in the gastric body. Paucity of gas in the small bowel. Calcified mass in the pelvis may represent a uterine fibroid. 210 cc of Omnipaque 300 were slowly injected through pre-existing nasogastric tube. Opacification of the distended stomach with contrast was seen. Opacification was seen at the level of the pylorus and the proximal aspect of the first portion of the duodenum. No contrast was seen passing through the duodenum. IMPRESSION: Findings consistent with gastric outlet obstruction at the level of the first portion of the duodenum. Electronically Signed   By: Fidela Salisbury M.D.   On: 11/26/2017 09:15   Ir Picc Placement Right >5 Yrs Inc Img Guide  Result Date: 11/25/2017 INDICATION: Cataract.  Atrial fibrillation. EXAM: RIGHT UPPER EXTREMITY PICC LINE PLACEMENT WITH ULTRASOUND AND FLUOROSCOPIC GUIDANCE MEDICATIONS: None ANESTHESIA/SEDATION: None FLUOROSCOPY TIME:  Fluoroscopy Time:  minutes 18 seconds (3 mGy). COMPLICATIONS: None immediate. PROCEDURE: The patient was advised of the possible risks and complications and agreed to undergo the procedure. The patient was then brought to the angiographic suite for the procedure. The  right arm was prepped with chlorhexidine, draped in the usual sterile fashion using maximum barrier technique (cap and mask, sterile gown, sterile gloves, large sterile sheet, hand hygiene and cutaneous antiseptic). Local anesthesia was attained by infiltration with 1% lidocaine. Ultrasound demonstrated patency of the basilic vein, and this was documented with an image. Under real-time ultrasound guidance, this vein was accessed with a 21 gauge micropuncture needle and image documentation was performed. The needle was exchanged over a guidewire for a peel-away sheath through which a 41 cm 5 [place] double lumen power injectable PICC was advanced, and positioned with its tip at the lower SVC/right atrial junction. Fluoroscopy during the procedure and fluoro spot radiograph confirms appropriate catheter position. The catheter was flushed, secured to the skin with Prolene sutures, and covered with a sterile dressing. IMPRESSION: Successful placement of a right arm PICC with sonographic and fluoroscopic guidance. The catheter is ready for use. Electronically Signed   By: Marybelle Killings M.D.   On: 11/25/2017 13:26        Scheduled Meds: . heparin injection (subcutaneous)  5,000 Units Subcutaneous Q8H  . insulin aspart  0-15 Units Subcutaneous Q4H  . mouth rinse  15 mL Mouth Rinse BID  . metoprolol tartrate  5 mg Intravenous Q6H   Continuous Infusions: . sodium chloride 75 mL/hr at 11/25/17 2100  . sodium chloride    . potassium chloride 10 mEq (11/26/17 1132)  . potassium PHOSPHATE IVPB (mmol) 15 mmol (11/26/17 1008)  . TPN ADULT (ION) 25 mL/hr at 11/25/17 2100  . TPN ADULT (ION)       LOS: 5 days    Time spent: 25 mins.More than 50% of that time was spent in counseling and/or coordination of care.      Shelly Coss, MD [facility] Pager [phone]  If 7PM-7AM, please contact night-coverage [email or web address] Password [email or web address] 11/26/2017, 12:18 PM

## 2017-11-26 NOTE — Evaluation (Addendum)
Physical Therapy Evaluation Patient Details Name: Anna Cuevas MRN: [id number] DOB: Jul 11, 1941 Today's Date: 11/26/2017   History of Present Illness  77 y.o. female admitted 11/21/17 for hematemesis. Found to have abnormal CT concerning for gallbladder carcinoma with hepatic metastasis as well as possible gastric outlet obstruction, and choledocholithiasis with stone in common bile duct.  s/p EGD 5/06.  PMH includes: a fib, HTN, DM, GERD, depression.     Clinical Impression  Pt admitted with above diagnosis. Pt currently with functional limitations due to the deficits listed below (see PT Problem List). PTA, pt living alone with support from daughter. Pt reports limited household ambulation with RW over last 3 months and assistance with ADLs. Upon eval pt presents with deconditioning and generalized weakness. Requiring mod A for all mobility and becoming dizzy with postural changes this visit. Unable to ambulate 2/2 to dizziness, transferred to bedside chair. Pt will benefit from skilled PT to increase their independence and safety with mobility to allow discharge to the venue listed below.       Follow Up Recommendations SNF;Supervision/Assistance - 24 hour    Equipment Recommendations  None recommended by PT    Recommendations for Other Services OT consult, Spiritual Care    Precautions / Restrictions Precautions Precautions: Fall Precaution Comments: Orthostatic BP Restrictions Weight Bearing Restrictions: No      Mobility  Bed Mobility Overal bed mobility: Needs Assistance Bed Mobility: Supine to Sit     Supine to sit: Mod assist     General bed mobility comments: Mod A to help legs over EOB and raise trunk. patient very deconditioned and weak   Transfers Overall transfer level: Needs assistance Equipment used: Rolling walker (2 wheeled) Transfers: Sit to/from Stand;Lateral/Scoot Transfers Sit to Stand: Mod assist        Lateral/Scoot Transfers: Mod  assist General transfer comment: Patient with mod A to stand into RW, can tolerate 15 seconds before reporting she is too dizzy. pt fearful of mobility siting she will fall despite encourgment from therapist. squat pivot into drop arm chair this visit.   Ambulation/Gait             General Gait Details: defered 2/2 to dizziness and anxiety   Stairs            Wheelchair Mobility    Modified Rankin (Stroke Patients Only)       Balance Overall balance assessment: Needs assistance Sitting-balance support: Bilateral upper extremity supported;Feet supported Sitting balance-Leahy Scale: Poor Sitting balance - Comments: Reliant on UE support   Standing balance support: Bilateral upper extremity supported Standing balance-Leahy Scale: Poor                               Pertinent Vitals/Pain Pain Assessment: No/denies pain Faces Pain Scale: No hurt    Home Living Family/patient expects to be discharged to:: Private residence Living Arrangements: Alone(daugther stays with patient part time) Available Help at Discharge: Family;Available 24 hours/day Type of Home: House Home Access: Level entry Entrance Stairs-Rails: None   Home Layout: One level Home Equipment: Walker - 2 wheels;Bedside commode;Shower seat;Wheelchair - manual      Prior Function Level of Independence: Needs assistance   Gait / Transfers Assistance Needed: using a RW for past 2-3 months   ADL's / Homemaking Assistance Needed: daughter helps with bathing cooking and house chores        Hand Dominance   Dominant Hand: Right  Extremity/Trunk Assessment   Upper Extremity Assessment Upper Extremity Assessment: Generalized weakness    Lower Extremity Assessment Lower Extremity Assessment: Generalized weakness       Communication   Communication: No difficulties  Cognition Arousal/Alertness: Awake/alert Behavior During Therapy: WFL for tasks assessed/performed                                           General Comments      Exercises General Exercises - Lower Extremity Ankle Circles/Pumps: AROM;Both;10 reps;Seated Long Arc Quad: AROM;Both;10 reps;Seated   Assessment/Plan    PT Assessment Patient needs continued PT services  PT Problem List Decreased strength;Decreased activity tolerance;Decreased balance;Decreased mobility;Decreased knowledge of use of DME;Decreased knowledge of precautions;Decreased safety awareness;Cardiopulmonary status limiting activity       PT Treatment Interventions DME instruction;Therapeutic activities;Gait training;Therapeutic exercise;Stair training;Balance training;Neuromuscular re-education;Functional mobility training;Patient/family education    PT Goals (Current goals can be found in the Care Plan section)  Acute Rehab PT Goals Patient Stated Goal: "im going to go, whats the point" Time For Goal Achievement: 12/10/17    Frequency Min 2X/week   Barriers to discharge        Co-evaluation               AM-PAC PT "6 Clicks" Daily Activity  Outcome Measure Difficulty turning over in bed (including adjusting bedclothes, sheets and blankets)?: A Little Difficulty moving from lying on back to sitting on the side of the bed? : Unable Difficulty sitting down on and standing up from a chair with arms (e.g., wheelchair, bedside commode, etc,.)?: Unable       6 Click Score: 5    End of Session Equipment Utilized During Treatment: Gait belt Activity Tolerance: Patient limited by fatigue Patient left: in chair;with call bell/phone within reach;with nursing/sitter in room Nurse Communication: Mobility status PT Visit Diagnosis: Unsteadiness on feet (R26.81);Muscle weakness (generalized) (M62.81);Other abnormalities of gait and mobility (R26.89)    Time: 1761-6073 PT Time Calculation (min) (ACUTE ONLY): 35 min   Charges:   PT Evaluation $PT Eval Low Complexity: 1 Low PT  Treatments $Therapeutic Exercise: 8-22 mins   PT G Codes:        Reinaldo Berber, PT, DPT Acute Rehab Services Pager: [phone]    Reinaldo Berber 11/26/2017, 10:44 AM

## 2017-11-26 NOTE — Progress Notes (Signed)
Patient ID: Anna Cuevas, female   DOB: 09-Oct-1940, 77 y.o.   MRN: [id number]    3 Days Post-Op  Subjective: Patient with no new complaints.  Objective: Vital signs in last 24 hours: Temp:  [98.1 F (36.7 C)-98.5 F (36.9 C)] 98.4 F (36.9 C) (05/09 0757) Pulse Rate:  [73-94] 80 (05/09 0700) Resp:  [19-30] 22 (05/09 0757) BP: (150-169)/(61-78) 169/78 (05/09 0757) SpO2:  [96 %-99 %] 98 % (05/09 0700) Last BM Date: 11/21/17  Intake/Output from previous day: 05/08 0701 - 05/09 0700 In: 725 [I.V.:475; IV Piggyback:250] Out: 1150 [Urine:1050; Emesis/NG output:100] Intake/Output this shift: No intake/output data recorded.  PE: Heart: regular Lungs: CTAB Abd: soft, NT, NGT in place with some bilious output  Lab Results:  Recent Labs    11/24/17 0304 11/25/17 0327  WBC 7.5 7.6  HGB 7.8* 7.8*  HCT 24.3* 24.2*  PLT 252 253   BMET Recent Labs    11/25/17 0327 11/26/17 0524  NA 136 136  K 3.0* 3.5  CL 102 99*  CO2 23 25  GLUCOSE 91 184*  BUN 7 5*  CREATININE 0.74 0.66  CALCIUM 7.9* 8.1*   PT/INR Recent Labs    11/24/17 0304  LABPROT 14.6  INR 1.15   CMP     Component Value Date/Time   NA 136 11/26/2017 0524   K 3.5 11/26/2017 0524   CL 99 (L) 11/26/2017 0524   CO2 25 11/26/2017 0524   GLUCOSE 184 (H) 11/26/2017 0524   BUN 5 (L) 11/26/2017 0524   CREATININE 0.66 11/26/2017 0524   CALCIUM 8.1 (L) 11/26/2017 0524   PROT 5.2 (L) 11/26/2017 0524   ALBUMIN 2.5 (L) 11/26/2017 0524   AST 16 11/26/2017 0524   ALT 15 11/26/2017 0524   ALKPHOS 116 11/26/2017 0524   BILITOT 0.8 11/26/2017 0524   GFRNONAA >60 11/26/2017 0524   GFRAA >60 11/26/2017 0524   Lipase     Component Value Date/Time   LIPASE 52 (H) 11/21/2017 1715       Studies/Results: US Biopsy (liver)  Result Date: 11/24/2017 INDICATION: Liver lesion is concerning for metastatic disease, suspect gallbladder primary EXAM: ULTRASOUND GUIDED CORE BIOPSY OF THE RIGHT HEPATIC LESION  MEDICATIONS: 1% LIDOCAINE LOCAL ANESTHESIA/SEDATION: Versed 1.'0mg'$  IV; Fentanyl 68mg IV; Moderate Sedation Time:  10 MINUTES The patient was continuously monitored during the procedure by the interventional radiology nurse under my direct supervision. FLUOROSCOPY TIME:  Fluoroscopy Time: None. COMPLICATIONS: None immediate. PROCEDURE: The procedure, risks, benefits, and alternatives were explained to the patient. Questions regarding the procedure were encouraged and answered. The patient understands and consents to the procedure. Previous imaging reviewed. Preliminary ultrasound performed. A hypoechoic solid lesion was localized in the right hepatic dome. Overlying skin marked for a right lateral intercostal approach. Under sterile conditions and local anesthesia, a 17 gauge 11.8 cm access needle was advanced percutaneously to the right hepatic lesion. Needle position confirmed with ultrasound. 18 gauge core biopsies obtained. Images obtained for documentation. Needle tract occluded with Gel-Foam. Postprocedure imaging demonstrates no hemorrhage or hematoma. Patient tolerated the biopsy well. FINDINGS: Ultrasound imaging confirms needle placement to the right hepatic lesion for core biopsy. IMPRESSION: Successful ultrasound right hepatic mass 18 gauge core biopsy Electronically Signed   By: MJerilynn Mages  Shick M.D.   On: 11/24/2017 13:58   Dg UDuanne LimerickW/water Sol Cm  Result Date: 11/26/2017 CLINICAL DATA:  Gastric outlet obstruction. EXAM: WATER SOLUBLE UPPER GI SERIES TECHNIQUE: Single-column upper GI series was performed using water soluble  contrast. CONTRAST:  210 cc of Omnipaque 300 injected through nasogastric tube. COMPARISON:  Body CT 11/21/2017 FLUOROSCOPY TIME:  Fluoroscopy Time:  42 seconds FINDINGS: Initial radiograph of the abdomen demonstrates distension of the stomach to the level of the gastric outlet. Enteric catheter terminates in the gastric body. Paucity of gas in the small bowel. Calcified mass in the pelvis  may represent a uterine fibroid. 210 cc of Omnipaque 300 were slowly injected through pre-existing nasogastric tube. Opacification of the distended stomach with contrast was seen. Opacification was seen at the level of the pylorus and the proximal aspect of the first portion of the duodenum. No contrast was seen passing through the duodenum. IMPRESSION: Findings consistent with gastric outlet obstruction at the level of the first portion of the duodenum. Electronically Signed   By: Fidela Salisbury M.D.   On: 11/26/2017 09:15   Ir Picc Placement Right >5 Yrs Inc Img Guide  Result Date: 11/25/2017 INDICATION: Cataract.  Atrial fibrillation. EXAM: RIGHT UPPER EXTREMITY PICC LINE PLACEMENT WITH ULTRASOUND AND FLUOROSCOPIC GUIDANCE MEDICATIONS: None ANESTHESIA/SEDATION: None FLUOROSCOPY TIME:  Fluoroscopy Time:  minutes 18 seconds (3 mGy). COMPLICATIONS: None immediate. PROCEDURE: The patient was advised of the possible risks and complications and agreed to undergo the procedure. The patient was then brought to the angiographic suite for the procedure. The right arm was prepped with chlorhexidine, draped in the usual sterile fashion using maximum barrier technique (cap and mask, sterile gown, sterile gloves, large sterile sheet, hand hygiene and cutaneous antiseptic). Local anesthesia was attained by infiltration with 1% lidocaine. Ultrasound demonstrated patency of the basilic vein, and this was documented with an image. Under real-time ultrasound guidance, this vein was accessed with a 21 gauge micropuncture needle and image documentation was performed. The needle was exchanged over a guidewire for a peel-away sheath through which a 41 cm 5 [place] double lumen power injectable PICC was advanced, and positioned with its tip at the lower SVC/right atrial junction. Fluoroscopy during the procedure and fluoro spot radiograph confirms appropriate catheter position. The catheter was flushed, secured to the skin with  Prolene sutures, and covered with a sterile dressing. IMPRESSION: Successful placement of a right arm PICC with sonographic and fluoroscopic guidance. The catheter is ready for use. Electronically Signed   By: Marybelle Killings M.D.   On: 11/25/2017 13:26   [place] Ekg Site Rite  Result Date: 11/24/2017 If Site Rite image not attached, placement could not be confirmed due to current cardiac rhythm.   Anti-infectives: Anti-infectives (From admission, onward)   None       Assessment/Plan Gastric outlet obstruction, secondary to metastatic gallbladder cancer -path from endo and liver confirmed adenocarcinoma likely from hepatobiliary source, likely gallbladder - ? Stent placement by GI to avoid a big operation on this patient who has a low alb and prealbumin and would not tolerate this well without possible complications. -patient has laid in bed since Sunday.  Needs to ambulated and be in a chair more than the bed to avoid further deconditioning.  RN asked for PT consult. -cont NGT for decompression -cont to hold eliquis -CEAelevated at 20.6 -CA19-9 also elevated at 314 -CT of the chest negative for met disease -Patient needs an oncology consult to determine if she can have treatment and if so what to help determine further plans of care. -will follow  Right hepatic liver masses x 3 Acute on chronic anemia Anticoagulated on eliquis -on hold DM HTN Choledocholithiasis -no biliary symptoms currently, may be incidental find  per GI.  GI bleed PCM -now on TNA  FEN -NPO/NGT, TNA VTE -SCDs/heparin ID -none currently     LOS: 5 days    Henreitta Cea , [facility] Surgery 11/26/2017, 10:32 AM Pager: [phone]

## 2017-11-26 NOTE — Progress Notes (Signed)
PHARMACY - ADULT TOTAL PARENTERAL NUTRITION CONSULT NOTE   Pharmacy Consult:  TPN Indication:  GOO  Patient Measurements: Height: 5\' 3"  (160 cm) Weight: 179 lb (81.2 kg) IBW/kg (Calculated) : 52.4 TPN AdjBW (KG): 59.6 Body mass index is 31.71 kg/m.  Assessment:  53 YOF presented on 11/21/17 with hematemesis.  Patient was hospitalized 5 weeks ago d/t N/V and continued to have those symptoms post discharge.  CT concerning for GOO and gallbladder carcinoma with hepatic mets as well as choledocholithiasis.  She is s/p EGD with duodenal biopsy on 5/6 and liver biopsy on 11/24/17.  Given expected prolonged NPO status and while work-up is in progress, pharmacy consulted to initiate TPN.  Patient reported that she gained weight instead of losing weight, and was eating about 50% of her meals for 2 weeks PTA. She is at risk for refeeding syndrome.  GI: baseline prealbumin low at 10, NG O/P 100 mL (brown) - Protonix gtt Endo: DM on metformin PTA - CBGs trending up (115-184) Insulin requirements in the past 24 hours: 5 unit SSI  Lytes: K 3.5 (s/p 5 runs yesterday), Mag 1.6 (s/p 2gm yesterday and another 2g ordered), CoCa 9.3, others low normal Renal: SCr 0.66 stable, BUN WNL - UOP 0.5 ml/kg/hr, NS at 75 ml/hr Pulm: stable on RA Cards: HTN - BP elevated, HR controlled - IV metoprolol, PRN hydralazine AC: Eliquis PTA for Afib dx in March, s/p KCentra on 5/4 - hgb low at 7.8, plts WNL Hepatobil: LFTs / tbili / TG WNL Neuro: A&O - PRN APAP ID: afebrile, WBC WNL - not on abx TPN Access: PICC to be placed 5/8 TPN start date: 11/25/17  Nutritional Goals (RD rec pending): 1600-1700 kCal and 85-100 gm protein per day, fluid >2L   Current Nutrition:  NPO   Plan:  Increase TPN to 45 ml/hr (goal rate 70 ml/hr) TPN will provide 58.32g AA, 172.8g CHO and 24.84g ILE for a total of 1069 kCal, meeting 66% of patient's needs. Electrolytes in TPN: continue increased Na/K/Phos/Mag, standard calcium, Cl:Ac  2:1 Daily multivitamin and trace elements in TPN Increase to moderate SSI Q4H Repeat KPhos 15 mmol IV x 1today KCl 48mEq IV x5 again today Reduce NS at 55 ml/hr with increase in TPN F/U AM labs   Sloan Leiter, PharmD, BCPS, BCCCP Clinical Pharmacist Clinical phone 11/26/2017 until 3:30PM - #[phone] After hours, please call [phone] 11/26/2017, 7:43 AM

## 2017-11-26 NOTE — Consult Note (Signed)
[facility] CONSULT NOTE  Patient Care Team: Kathyrn Lass, MD as PCP - General (Family Medicine)  CHIEF COMPLAINTS/PURPOSE OF CONSULTATION:  Metastatic adenocarcinoma of gallbladder  HISTORY OF PRESENTING ILLNESS:  Anna Cuevas 77 y.o. female is admitted to the hospital with hematemesis and gastric outlet obstruction.  CT scan revealed a gallbladder tumor that was invading into the liver and into the duodenum and causing gastric outlet obstruction.  She was started on anticoagulation for atrial fibrillation.  NG tube has been placed.  Biopsy of the duodenum as well as the liver came back positive for adenocarcinoma pancreaticobiliary in origin.  We were consulted to assist with oncology management.  Patient's daughter is her primary caregiver.  She was at her bedside when I spoke to her.   Patient was not complaining of any pain.  She has an NG tube. I reviewed her records extensively and collaborated the history with the patient.   MEDICAL HISTORY:  Past Medical History:  Diagnosis Date  . Diabetes mellitus without complication ([place])   . Hypertension     SURGICAL HISTORY: Past Surgical History:  Procedure Laterality Date  . CATARACT EXTRACTION, BILATERAL Bilateral   . ESOPHAGOGASTRODUODENOSCOPY (EGD) WITH PROPOFOL N/A 11/23/2017   Procedure: ESOPHAGOGASTRODUODENOSCOPY (EGD) WITH PROPOFOL;  Surgeon: Otis Brace, MD;  Location: [facility];  Service: Gastroenterology;  Laterality: N/A;    SOCIAL HISTORY: Social History   Socioeconomic History  . Marital status: Widowed    Spouse name: Not on file  . Number of children: Not on file  . Years of education: Not on file  . Highest education level: Not on file  Occupational History  . Not on file  Social Needs  . Financial resource strain: Not on file  . Food insecurity:    Worry: Not on file    Inability: Not on file  . Transportation needs:    Medical: Not on file    Non-medical: Not on file  Tobacco  Use  . Smoking status: Never Smoker  . Smokeless tobacco: Never Used  Substance and Sexual Activity  . Alcohol use: Never    Frequency: Never  . Drug use: Never  . Sexual activity: Not on file  Lifestyle  . Physical activity:    Days per week: Not on file    Minutes per session: Not on file  . Stress: Not on file  Relationships  . Social connections:    Talks on phone: Not on file    Gets together: Not on file    Attends religious service: Not on file    Active member of club or organization: Not on file    Attends meetings of clubs or organizations: Not on file    Relationship status: Not on file  . Intimate partner violence:    Fear of current or ex partner: Not on file    Emotionally abused: Not on file    Physically abused: Not on file    Forced sexual activity: Not on file  Other Topics Concern  . Not on file  Social History Narrative  . Not on file    FAMILY HISTORY: Family History  Problem Relation Age of Onset  . Stroke Neg Hx     ALLERGIES:  has No Known Allergies.  MEDICATIONS:  Current Facility-Administered Medications  Medication Dose Route Frequency Provider Last Rate Last Dose  . 0.9 %  sodium chloride infusion   Intravenous Continuous Priscella Mann, RPH 75 mL/hr at 11/25/17 2100    .  0.9 %  sodium chloride infusion   Intravenous Continuous Priscella Mann, RPH      . acetaminophen (TYLENOL) tablet 650 mg  650 mg Oral Q6H PRN Ivor Costa, MD       Or  . acetaminophen (TYLENOL) suppository 650 mg  650 mg Rectal Q6H PRN Ivor Costa, MD      . heparin injection 5,000 Units  5,000 Units Subcutaneous Delsa Bern, MD   5,000 Units at 11/26/17 1313  . hydrALAZINE (APRESOLINE) injection 5 mg  5 mg Intravenous Q2H PRN Ivor Costa, MD      . insulin aspart (novoLOG) injection 0-15 Units  0-15 Units Subcutaneous Q4H Sloan Leiter B, RPH   3 Units at 11/26/17 1240  . iopamidol (ISOVUE-300) 61 % injection 150 mL  150 mL Oral Once PRN Brahmbhatt, Parag, MD       . MEDLINE mouth rinse  15 mL Mouth Rinse BID Cherene Altes, MD   15 mL at 11/26/17 1317  . menthol-cetylpyridinium (CEPACOL) lozenge 3 mg  1 lozenge Oral PRN Saverio Danker, PA-C   3 mg at 11/25/17 0951  . metoprolol tartrate (LOPRESSOR) injection 5 mg  5 mg Intravenous Q6H Cherene Altes, MD   5 mg at 11/26/17 1238  . ondansetron (ZOFRAN) tablet 4 mg  4 mg Oral Q6H PRN Ivor Costa, MD       Or  . ondansetron [facility]) injection 4 mg  4 mg Intravenous Q6H PRN Ivor Costa, MD      . phenol (CHLORASEPTIC) mouth spray 1 spray  1 spray Mouth/Throat PRN Saverio Danker, PA-C      . potassium chloride 10 mEq in 100 mL IVPB  10 mEq Intravenous Q1 Hr x 5 Millen, Jessica B, RPH 100 mL/hr at 11/26/17 1653 10 mEq at 11/26/17 1653  . sodium chloride flush (NS) 0.9 % injection 10-40 mL  10-40 mL Intracatheter PRN Shelly Coss, MD      . TPN ADULT (ION)   Intravenous Continuous TPN Tyrone Apple, RPH 25 mL/hr at 11/25/17 2100    . TPN ADULT (ION)   Intravenous Continuous TPN Millen, Jessica B, RPH        REVIEW OF SYSTEMS:   Constitutional: NG tube Eyes: Denies blurriness of vision, double vision or watery eyes Ears, nose, mouth, throat, and face: Denies mucositis or sore throat Respiratory: Denies cough, dyspnea or wheezes Cardiovascular: Atrial fibrillation she was started on anticoagulation for atrial fibrillation. Gastrointestinal:  Denies nausea, heartburn or change in bowel habits Skin: Denies abnormal skin rashes Neurological:Denies numbness, tingling or new weaknesses Behavioral/Psych: Anxious  All other systems were reviewed with the patient and are negative.  PHYSICAL EXAMINATION: ECOG PERFORMANCE STATUS: 3 - Symptomatic, >50% confined to bed  Vitals:   11/26/17 1600 11/26/17 1638  BP:  (!) 165/75  Pulse: 84 81  Resp: (!) 23 (!) 24  Temp:    SpO2: 98% 99%   Filed Weights   11/22/17 1200 11/23/17 1111  Weight: 179 lb 7.3 oz (81.4 kg) 179 lb (81.2 kg)     GENERAL:alert, no distress and comfortable SKIN: skin color, texture, turgor are normal, no rashes or significant lesions EYES: normal, conjunctiva are pink and non-injected, sclera clear OROPHARYNX:no exudate, no erythema and lips, buccal mucosa, and tongue normal  NECK: supple, thyroid normal size, non-tender, without nodularity LYMPH:  no palpable lymphadenopathy in the cervical, axillary or inguinal LUNGS: clear to auscultation and percussion with normal breathing effort HEART: regular rate & rhythm  and no murmurs and no lower extremity edema ABDOMEN: Mildly distended and tender Musculoskeletal:no cyanosis of digits and no clubbing  PSYCH: alert & oriented x 3 with fluent speech NEURO: no focal motor/sensory deficits   LABORATORY DATA:  I have reviewed the data as listed Lab Results  Component Value Date   WBC 8.3 11/26/2017   HGB 8.2 (L) 11/26/2017   HCT 25.6 (L) 11/26/2017   MCV 80.3 11/26/2017   PLT 296 11/26/2017   Lab Results  Component Value Date   NA 136 11/26/2017   K 3.5 11/26/2017   CL 99 (L) 11/26/2017   CO2 25 11/26/2017   CT abdomen and pelvis 11/21/2017: Ill-defined gallbladder mass with several hepatic metastases measuring up to 2 cm.  Stomach distention with gastric outlet obstruction, intrahepatic biliary dilatation.  ASSESSMENT AND PLAN:  Metastatic gallbladder carcinoma: Patient is 77 year old and has a gallbladder tumor that is invading into the liver as well as into the duodenum and causing gastric outlet obstruction. I discussed with the patient and her daughter that this is incurable disease.  gallbladder adenocarcinomas did not respond well to chemotherapy.  Chemotherapy does not prolong life nor do they provide any palliation. There are no specific targeted therapies for gallbladder carcinomas. Because of terrible prognosis from these cancers, I recommended hospice care.  Hematemesis and gastric outlet obstruction: I spoke about her case with  Dr. Lisbeth Renshaw with radiation oncology. Radiation oncology will determine if she could benefit from palliative radiation.  Patient was asking is about different options including a J-tube placement.  I instructed them that radiation oncology and gastroenterology will have to make those decisions.  I consulted radiation oncology as well as hospice care.  Patient is interested in home hospice.  Her brother had hospice care at her home and she knows about it fully.  All questions were answered. The patient knows to call the clinic with any problems, questions or concerns.    Harriette Ohara, MD @T @

## 2017-11-26 NOTE — Progress Notes (Signed)
Hematology-Oncology Full consult to folllow - Met Gall bladder cancer: Recommendation - no role of systemic chemo: because chemo doesn't offer survival advantage and doesn't give palliation either - Rad-Onc Dr.Moody consulted -Hospice consulted and recommended. Patients daughter is her primary care giver. Home with hospice is preferred. - Gastric Outlet Obst: J tube may be an option, will have to see if Radiation can improve this as well.

## 2017-11-27 ENCOUNTER — Ambulatory Visit
Admit: 2017-11-27 | Discharge: 2017-11-27 | Disposition: A | Discharge status: Home / Self Care | Payer: PPO | Attending: Radiation Oncology | Admitting: Radiation Oncology

## 2017-11-27 DIAGNOSIS — Z515 Encounter for palliative care: Secondary | ICD-10-CM

## 2017-11-27 DIAGNOSIS — Z7189 Other specified counseling: Secondary | ICD-10-CM

## 2017-11-27 DIAGNOSIS — R112 Nausea with vomiting, unspecified: Secondary | ICD-10-CM

## 2017-11-27 LAB — GLUCOSE, CAPILLARY
GLUCOSE-CAPILLARY: 207 mg/dL — AB (ref 65–99)
GLUCOSE-CAPILLARY: 199 mg/dL — AB (ref 65–99)
GLUCOSE-CAPILLARY: 200 mg/dL — AB (ref 65–99)
GLUCOSE-CAPILLARY: 185 mg/dL — AB (ref 65–99)
GLUCOSE-CAPILLARY: 194 mg/dL — AB (ref 65–99)
Glucose-Capillary: 175 mg/dL — ABNORMAL HIGH (ref 65–99)

## 2017-11-27 LAB — BASIC METABOLIC PANEL
ANION GAP: 7 (ref 5–15)
BUN: 9 mg/dL (ref 6–20)
CALCIUM: 8.2 mg/dL — AB (ref 8.9–10.3)
CO2: 29 mmol/L (ref 22–32)
Chloride: 99 mmol/L — ABNORMAL LOW (ref 101–111)
Creatinine, Ser: 0.46 mg/dL (ref 0.44–1.00)
GFR calc Af Amer: >60 mL/min (ref >60)
GFR calc non Af Amer: >60 mL/min (ref >60)
GLUCOSE: 211 mg/dL — AB (ref 65–99)
Potassium: 4 mmol/L (ref 3.5–5.1)
Sodium: 135 mmol/L (ref 135–145)

## 2017-11-27 LAB — CULTURE, BLOOD (ROUTINE X 2)
CULTURE: NO GROWTH
CULTURE: NO GROWTH
Special Requests: ADEQUATE
Special Requests: ADEQUATE

## 2017-11-27 LAB — MAGNESIUM: Magnesium: 1.7 mg/dL (ref 1.7–2.4)

## 2017-11-27 LAB — PHOSPHORUS: Phosphorus: 3.1 mg/dL (ref 2.5–4.6)

## 2017-11-27 MED ORDER — MORPHINE SULFATE (PF) 2 MG/ML IV SOLN
2.0000 mg | INTRAVENOUS | Status: DC | PRN
  Administered 2017-11-29: 2 mg via INTRAVENOUS
  Filled 2017-11-27: qty 1

## 2017-11-27 MED ORDER — TRAVASOL 10 % IV SOLN
INTRAVENOUS | Status: AC
  Administered 2017-11-27: 18:00:00 via INTRAVENOUS
  Filled 2017-11-27: qty 583.2

## 2017-11-27 NOTE — Consult Note (Signed)
Consultation Note Date: 11/27/2017   Patient Name: Anna Cuevas  DOB: Jun 04, 1941  MRN: [id number]  Age / Sex: 77 y.o., female  PCP: Kathyrn Lass, MD Referring Physician: Shelly Coss, MD  Reason for Consultation: Establishing goals of care, Hospice Evaluation and Psychosocial/spiritual support  HPI/Patient Profile: 77 y.o. female  with past medical history of atrial fib, hypertension, diabetes admitted on 11/21/2017 with nausea vomiting, hematemesis.  Patient was found to have gastric outlet obstruction per EGD.  CT of the abdomen shows ill-defined gallbladder invading into the liver; metastatic disease to liver, severe distention, gastric outlet obstruction.  CT of the chest was negative for metastatic spread to the lungs.  She has been seen by Dr. 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  She is being considered for G-tube placement by IR for palliative/comfort measures.  She is currently on TPN.  Consult ordered for goals of care , hospice evaluation.   Clinical Assessment and Goals of Care: Met with patient, patient's daughter Vinnie Level, as well as chart reviewed.  Patient shares that she is very overwhelmed, "I cannot think".  She states "all I want to do is go home for the time I have left".  She tells me she still finds this diagnosis is a real and was hoping for more time with her daughter and 5 grandchildren she describes multiple losses over the past several years specifically her husband died 1 year ago and her last living sibling.  Patient at this point is able to participate in goals of care discussion although she is exhibiting short-term memory deficits and admits that she is overwhelmed with this new, Phillip Heal, cancer diagnosis.  She asked that I speak to her daughter Junious Dresser at [phone].  Ms. Elveria Royals would be her healthcare power of attorney if she was unable to speak for herself  Reduce concepts of CODE STATUS, defined terms.  Patient is currently a full code  Met with the patient as well as her daughter and we reviewed and defined full code, DNR, TPN, home health as contrasted with hospice.    SUMMARY OF RECOMMENDATIONS   Patient would qualify for her hospice benefit however at this point she is on TPN, and hospice will not take clients on TPN Patient is planning to go for venting PEG for palliation of nausea Family is going to meet together sometime this evening and talk further about CODE STATUS as well as going home with home health (if they decide to continue TPN) versus discontinuation of TPN and home with hospice Palliative medicine for provider to follow-up with patient on 11/28/2017 Code Status/Advance Care Planning:  Full code    Symptom Management:   Nausea: Agree with palliative G-tube for symptom management.  Patient verbalizing that Zofran as needed has been beneficial.  Haldol is also a good agent to use for its dopaminergic action that could be contributing to intractable nausea and vomiting  as well as low-dose steroids, such as Decadron 4 mg daily  Neck pain: Patient is having severe neck pain.  Tylenol was given earlier but was ineffective.  As she is unable to take anything by mouth would recommend either IV Toradol short-term 15 mg every 8 hours as needed; she also would be a candidate for IV morphine 1 to 2 mg every 3 hours as needed  Palliative Prophylaxis:   Aspiration, Bowel Regimen, Delirium Protocol, Eye Care, Frequent Pain Assessment, Oral Care and Turn Reposition   Psycho-social/Spiritual:   Desire for further Chaplaincy support:no  Additional Recommendations: Grief/Bereavement Support  Prognosis:   Patient has a very grim prognosis and is not a candidate for chemo or other curative efforts.  Palliative G-tube will  be beneficial as a venting PEG for symptom management.  As long as she is on TPN she will not be considered for hospice.  Should she forego TPN, I would not be surprised if patient's prognosis was just weeks  Discharge Planning: To Be Determined      Primary Diagnoses: Present on Admission: . N&V (nausea and vomiting) . Atrial fibrillation with rapid ventricular response ([place]) . GIB (gastrointestinal bleeding) . Depression . Essential hypertension . GERD (gastroesophageal reflux disease) . Pressure injury of skin . Gallbladder carcinoma ([place]) . Blood loss anemia   I have reviewed the medical record, interviewed the patient and family, and examined the patient. The following aspects are pertinent.  Past Medical History:  Diagnosis Date  . Diabetes mellitus without complication ([place])   . Hypertension    Social History   Socioeconomic History  . Marital status: Widowed    Spouse name: Not on file  . Number of children: Not on file  . Years of education: Not on file  . Highest education level: Not on file  Occupational History  . Not on file  Social Needs  . Financial resource strain: Not on file  . Food insecurity:    Worry: Not on file    Inability: Not on file  . Transportation needs:    Medical: Not on file    Non-medical: Not on file  Tobacco Use  . Smoking status: Never Smoker  . Smokeless tobacco: Never Used  Substance and Sexual Activity  . Alcohol use: Never    Frequency: Never  . Drug use: Never  . Sexual activity: Not on file  Lifestyle  . Physical activity:    Days per week: Not on file    Minutes per session: Not on file  . Stress: Not on file  Relationships  . Social connections:    Talks on phone: Not on file    Gets together: Not on file    Attends religious service: Not on file    Active member of club or organization: Not on file    Attends meetings of clubs or organizations: Not on file    Relationship status: Not on file  Other Topics  Concern  . Not on file  Social History Narrative  . Not on file   Family History  Problem Relation Age of Onset  . Stroke Neg Hx    Scheduled Meds: . heparin injection (subcutaneous)  5,000 Units Subcutaneous Q8H  . insulin aspart  0-15 Units Subcutaneous Q4H  . mouth rinse  15 mL Mouth Rinse BID  . metoprolol tartrate  5 mg Intravenous Q6H   Continuous Infusions: . sodium chloride 55 mL/hr at 11/27/17 0406  . TPN ADULT (ION) 45 mL/hr at 11/26/17  1734   PRN Meds:.acetaminophen **OR** acetaminophen, hydrALAZINE, iopamidol, menthol-cetylpyridinium, ondansetron **OR** ondansetron (ZOFRAN) IV, phenol, sodium chloride flush Medications Prior to Admission:  Prior to Admission medications   Medication Sig Start Date End Date Taking? Authorizing Provider  acetaminophen (TYLENOL) 325 MG tablet Take 2 tablets (650 mg total) by mouth every 6 (six) hours as needed for mild pain (or Fever >/= 101). 10/20/17  Yes Emokpae, Courage, MD  apixaban (ELIQUIS) 5 MG TABS tablet Take 1 tablet (5 mg total) by mouth 2 (two) times daily. 10/20/17  Yes Emokpae, Courage, MD  bisacodyl (DULCOLAX) 5 MG EC tablet Take 5 mg by mouth daily as needed for moderate constipation.   Yes [provider]  diltiazem (CARDIZEM CD) 360 MG 24 hr capsule Take 1 capsule (360 mg total) by mouth daily. 10/20/17 10/20/18 Yes Emokpae, Courage, MD  docusate sodium (COLACE) 100 MG capsule Take 200 mg by mouth as needed for mild constipation.   Yes [provider]  metFORMIN (GLUCOPHAGE) 500 MG tablet Take 1 tablet (500 mg total) by mouth daily. 10/20/17  Yes Emokpae, Courage, MD  metoprolol tartrate (LOPRESSOR) 25 MG tablet Take 1 tablet (25 mg total) by mouth 2 (two) times daily. 10/20/17  Yes Emokpae, Courage, MD  montelukast (SINGULAIR) 10 MG tablet Take 10 mg by mouth at bedtime.   Yes [provider]  ondansetron (ZOFRAN) 4 MG tablet Take 1 tablet (4 mg total) by mouth every 6 (six) hours as needed for nausea. 10/20/17   Yes Emokpae, Courage, MD  pantoprazole (PROTONIX) 40 MG tablet Take 1 tablet (40 mg total) by mouth daily. 10/21/17  Yes Emokpae, Courage, MD  polyethylene glycol (MIRALAX / GLYCOLAX) packet Take 17 g by mouth daily as needed for moderate constipation. 10/20/17  Yes Emokpae, Courage, MD  sertraline (ZOLOFT) 100 MG tablet Take 200 mg by mouth daily. 08/10/17  Yes [provider]  loratadine (CLARITIN) 10 MG tablet Take 10 mg by mouth at bedtime.    [provider]   No Known Allergies Review of Systems  Unable to perform ROS: Other    Physical Exam  Constitutional: She appears well-developed and well-nourished.  Ill appearing older female  HENT:  Head: Normocephalic and atraumatic.  Cardiovascular: Normal rate.  Pulmonary/Chest: Effort normal.  Abdominal: She exhibits distension.  NG tube in  Musculoskeletal: Normal range of motion.  Skin: Skin is warm and dry.  Psychiatric:  Affect constricted, mood sad Patient reports feeling overwhelmed, poor concentration  Nursing note and vitals reviewed.   Vital Signs: BP 140/69 (BP Location: Left Wrist)   Pulse 83   Temp 98.3 F (36.8 C) (Oral)   Resp (!) 24   Ht '5\' 3"'$  (1.6 m)   Wt 81.4 kg (179 lb 7.3 oz)   SpO2 98%   BMI 31.79 kg/m  Pain Scale: 0-10 POSS *See Group Information*: S-Acceptable,Sleep, easy to arouse Pain Score: 0-No pain   SpO2: SpO2: 98 % O2 Device:SpO2: 98 % O2 Flow Rate: .O2 Flow Rate (L/min): 2 L/min  IO: Intake/output summary:   Intake/Output Summary (Last 24 hours) at 11/27/2017 1053 Last data filed at 11/27/2017 0800 Gross per 24 hour  Intake 2442 ml  Output 1500 ml  Net 942 ml    LBM: Last BM Date: 11/21/17 Baseline Weight: Weight: 81.4 kg (179 lb 7.3 oz) Most recent weight: Weight: 81.4 kg (179 lb 7.3 oz)     Palliative Assessment/Data:   Flowsheet Rows     Most Recent Value  Intake  Tab  Referral Department  Hospitalist  Unit at Time of Referral  Intermediate Care Unit    Palliative Care Primary Diagnosis  Cancer  Date Notified  11/26/17  Palliative Care Type  New Palliative care  Reason for referral  Clarify Goals of Care, Counsel Regarding Hospice, Psychosocial or Spiritual support  Date of Admission  11/21/17  Date first seen by Palliative Care  11/27/17  # of days Palliative referral response time  1 Day(s)  # of days IP prior to Palliative referral  5  Clinical Assessment  Palliative Performance Scale Score  40%  Pain Max last 24 hours  Not able to report  Pain Min Last 24 hours  Not able to report  Dyspnea Max Last 24 Hours  Not able to report  Dyspnea Min Last 24 hours  Not able to report  Nausea Max Last 24 Hours  Not able to report  Nausea Min Last 24 Hours  Not able to report  Anxiety Max Last 24 Hours  Not able to report  Anxiety Min Last 24 Hours  Not able to report  Other Max Last 24 Hours  Not able to report  Psychosocial & Spiritual Assessment  Palliative Care Outcomes  Patient/Family meeting held?  Yes  Who was at the meeting?  pt, dtr      Time In: 0930 Time Out: 1045 Time Total: 75 min Greater than 50%  of this time was spent counseling and coordinating care related to the above assessment and plan.  Signed by: Dory Horn, NP   Please contact Palliative Medicine Team phone at [phone] for questions and concerns.  For individual provider: See Shea Evans

## 2017-11-27 NOTE — Consult Note (Signed)
Chief Complaint: Patient was seen in consultation today for percutaneous gastric tube placement Chief Complaint  Patient presents with  . Emesis   at the request of Dr Tawanna Solo   Supervising Physician: Sandi Mariscal  Patient Status: [facility] - In-pt  History of Present Illness: Anna Cuevas is a 77 y.o. female   Weeks of N/V and pain Hematemesis  + GB carcinoma:adenocarcinoma pancreaticobiliary  Gastric outlet obstruction Now with Home Hospice per Dr Redmond Pulling note  Requesting gastric tube for gastric outlet obstrucyton/palliative  Dr Pascal Lux has reviewed imaging and approves procedure'  Plan for possible Mon procedure in IR  Past Medical History:  Diagnosis Date  . Diabetes mellitus without complication ([place])   . Hypertension     Past Surgical History:  Procedure Laterality Date  . CATARACT EXTRACTION, BILATERAL Bilateral   . ESOPHAGOGASTRODUODENOSCOPY (EGD) WITH PROPOFOL N/A 11/23/2017   Procedure: ESOPHAGOGASTRODUODENOSCOPY (EGD) WITH PROPOFOL;  Surgeon: Otis Brace, MD;  Location: [facility];  Service: Gastroenterology;  Laterality: N/A;    Allergies: Patient has no known allergies.  Medications: Prior to Admission medications   Medication Sig Start Date End Date Taking? Authorizing Provider  acetaminophen (TYLENOL) 325 MG tablet Take 2 tablets (650 mg total) by mouth every 6 (six) hours as needed for mild pain (or Fever >/= 101). 10/20/17  Yes Emokpae, Courage, MD  apixaban (ELIQUIS) 5 MG TABS tablet Take 1 tablet (5 mg total) by mouth 2 (two) times daily. 10/20/17  Yes Emokpae, Courage, MD  bisacodyl (DULCOLAX) 5 MG EC tablet Take 5 mg by mouth daily as needed for moderate constipation.   Yes [provider]  diltiazem (CARDIZEM CD) 360 MG 24 hr capsule Take 1 capsule (360 mg total) by mouth daily. 10/20/17 10/20/18 Yes Emokpae, Courage, MD  docusate sodium (COLACE) 100 MG capsule Take 200 mg by mouth as needed for mild constipation.   Yes [provider]  metFORMIN (GLUCOPHAGE) 500 MG tablet Take 1 tablet (500 mg total) by mouth daily. 10/20/17  Yes Emokpae, Courage, MD  metoprolol tartrate (LOPRESSOR) 25 MG tablet Take 1 tablet (25 mg total) by mouth 2 (two) times daily. 10/20/17  Yes Emokpae, Courage, MD  montelukast (SINGULAIR) 10 MG tablet Take 10 mg by mouth at bedtime.   Yes [provider]  ondansetron (ZOFRAN) 4 MG tablet Take 1 tablet (4 mg total) by mouth every 6 (six) hours as needed for nausea. 10/20/17  Yes Emokpae, Courage, MD  pantoprazole (PROTONIX) 40 MG tablet Take 1 tablet (40 mg total) by mouth daily. 10/21/17  Yes Emokpae, Courage, MD  polyethylene glycol (MIRALAX / GLYCOLAX) packet Take 17 g by mouth daily as needed for moderate constipation. 10/20/17  Yes Emokpae, Courage, MD  sertraline (ZOLOFT) 100 MG tablet Take 200 mg by mouth daily. 08/10/17  Yes [provider]  loratadine (CLARITIN) 10 MG tablet Take 10 mg by mouth at bedtime.    [provider]     Family History  Problem Relation Age of Onset  . Stroke Neg Hx     Social History   Socioeconomic History  . Marital status: Widowed    Spouse name: Not on file  . Number of children: Not on file  . Years of education: Not on file  . Highest education level: Not on file  Occupational History  . Not on file  Social Needs  . Financial resource strain: Not on file  . Food insecurity:    Worry: Not on file  Inability: Not on file  . Transportation needs:    Medical: Not on file    Non-medical: Not on file  Tobacco Use  . Smoking status: Never Smoker  . Smokeless tobacco: Never Used  Substance and Sexual Activity  . Alcohol use: Never    Frequency: Never  . Drug use: Never  . Sexual activity: Not on file  Lifestyle  . Physical activity:    Days per week: Not on file    Minutes per session: Not on file  . Stress: Not on file  Relationships  . Social connections:    Talks on phone: Not on file    Gets together: Not  on file    Attends religious service: Not on file    Active member of club or organization: Not on file    Attends meetings of clubs or organizations: Not on file    Relationship status: Not on file  Other Topics Concern  . Not on file  Social History Narrative  . Not on file     Review of Systems: A 12 point ROS discussed and pertinent positives are indicated in the HPI above.  All other systems are negative.  Review of Systems  Constitutional: Positive for activity change, appetite change and fatigue. Negative for fever and unexpected weight change.  Respiratory: Negative for cough and shortness of breath.   Cardiovascular: Negative for chest pain.  Gastrointestinal: Positive for abdominal pain, nausea and vomiting.  Musculoskeletal: Positive for back pain and gait problem.  Neurological: Positive for weakness.  Psychiatric/Behavioral: Positive for decreased concentration. Negative for behavioral problems.    Vital Signs: BP 140/69 (BP Location: Left Wrist)   Pulse 83   Temp 98.3 F (36.8 C) (Oral)   Resp (!) 24   Ht 5\' 3"  (1.6 m)   Wt 179 lb 7.3 oz (81.4 kg)   SpO2 98%   BMI 31.79 kg/m   Physical Exam  Cardiovascular: Normal rate and regular rhythm.  Pulmonary/Chest: Effort normal. She has wheezes.  Abdominal: Soft. She exhibits distension. There is tenderness.  Musculoskeletal: Normal range of motion.  Neurological: She is alert.  Skin: Skin is warm and dry.  Psychiatric: She has a normal mood and affect.  Spoke to daughter Anna Cuevas She consents for G tube placement  Nursing note and vitals reviewed.   Imaging: Ct Chest Wo Contrast  Result Date: 11/23/2017 CLINICAL DATA:  Cough, liver metastasis suspected. Patient denies chest pain and dyspnea. EXAM: CT CHEST WITHOUT CONTRAST TECHNIQUE: Multidetector CT imaging of the chest was performed following the standard protocol without IV contrast. COMPARISON:  CT 07/08/2007, CXR 10/15/2017, CT abdomen and pelvis  11/21/2017 FINDINGS: Cardiovascular: Top-normal size heart without pericardial effusion or thickening. Left main in three-vessel coronary arteriosclerosis is noted. There is moderate thoracic aortic atherosclerosis without aneurysm. The main pulmonary artery is dilated up to 3.5 cm compatible with changes of chronic pulmonary hypertension. Mediastinum/Nodes: Calcified mediastinal lymph nodes compatible with old granulomatous disease. Patent trachea and mainstem bronchi. Gastric tube is seen coursing within the thoracic esophagus and extending into the moderately distended included stomach. Lungs/Pleura: Small left effusion with atelectasis. Scarring and atelectasis at the apices. Superimposed ground-glass opacities in both upper lobes, left greater than right likely representing areas of alveolitis/pneumonitis. No dominant mass is noted. There is no pneumothorax. Upper Abdomen: Small amount of perisplenic ascites. Previously noted ill-defined hepatic masses are less distinct on this unenhanced study. Gallbladder is not included on images provided. Musculoskeletal: No acute nor suspicious osseous abnormality.  No aggressive osseous lesions. IMPRESSION: 1. Faint ground-glass infiltrates the upper lobes, left greater greater than right likely representing areas of alveolitis or pneumonitis. Superimposed atelectasis and/or scarring in both upper lobes. 2. No dominant pulmonary mass is seen. Small left effusion with atelectasis. 3. Left main and three-vessel coronary arteriosclerosis. 53. Stigmata of old granulomatous disease with calcified mediastinal lymph nodes. 5. Small amount of perisplenic ascites. Previously noted hepatic lesions are difficult to identify on this unenhanced exam. Aortic Atherosclerosis (ICD10-I70.0). Electronically Signed   By: Ashley Royalty M.D.   On: 11/23/2017 18:40   Ct Abdomen Pelvis W Contrast  Result Date: 11/21/2017 CLINICAL DATA:  Hematemesis. EXAM: CT ABDOMEN AND PELVIS WITH CONTRAST  TECHNIQUE: Multidetector CT imaging of the abdomen and pelvis was performed using the standard protocol following bolus administration of intravenous contrast. CONTRAST:  60mL ISOVUE-300 IOPAMIDOL (ISOVUE-300) INJECTION 61% COMPARISON:  None. FINDINGS: Lower chest: Mild faint peribronchovascular ground-glass densities at the lung bases may reflect microaspiration. Prominent distention of the distal esophagus. Lipoma of the interatrial septum. Hepatobiliary: Heterogeneous, ill-defined gallbladder invading the adjacent liver. There are several hypoenhancing lesions in the right liver measuring up to 2.0 cm. Cholelithiasis. Mild central intrahepatic biliary dilatation. There are 3 mm gallstones in the proximal and distal common bile duct, which is nondilated. Pancreas: Unremarkable. No pancreatic ductal dilatation or surrounding inflammatory changes. Spleen: Normal in size without focal abnormality. Adrenals/Urinary Tract: The adrenal glands are unremarkable. Mild bilateral renal atrophy. No focal renal lesion. No renal or ureteral calculi. No hydronephrosis. The bladder is unremarkable. Stomach/Bowel: The stomach is markedly distended with focal narrowing of the first portion of the duodenum due to the gallbladder mass. Remaining small bowel is decompressed. The colon is unremarkable. Vascular/Lymphatic: Aortic atherosclerosis. No enlarged abdominal or pelvic lymph nodes. Reproductive: Calcified fibroid.  No adnexal mass. Other: Trace perihepatic and perisplenic free fluid. No pneumoperitoneum. Diastasis of the right lateral abdominal wall. Musculoskeletal: No acute or significant osseous findings. Degenerative changes of the lumbar spine. IMPRESSION: 1. Heterogeneous, ill-defined gallbladder invading the adjacent liver, consistent with gallbladder carcinoma. Several hepatic metastases measuring up to 2.0 cm. 2. Severe distention of the stomach with gastric outlet obstruction secondary to focal narrowing and invasion  of the first portion of the duodenum by the gallbladder mass. 3. Mild central intrahepatic biliary dilatation. Choledocholithiasis with small 3 mm gallstones in the nondilated proximal and distal common bile duct. 4.  Aortic atherosclerosis (ICD10-I70.0). Electronically Signed   By: Titus Dubin M.D.   On: 11/21/2017 20:30   US Biopsy (liver)  Result Date: 11/24/2017 INDICATION: Liver lesion is concerning for metastatic disease, suspect gallbladder primary EXAM: ULTRASOUND GUIDED CORE BIOPSY OF THE RIGHT HEPATIC LESION MEDICATIONS: 1% LIDOCAINE LOCAL ANESTHESIA/SEDATION: Versed 1.0mg  IV; Fentanyl 77mcg IV; Moderate Sedation Time:  10 MINUTES The patient was continuously monitored during the procedure by the interventional radiology nurse under my direct supervision. FLUOROSCOPY TIME:  Fluoroscopy Time: None. COMPLICATIONS: None immediate. PROCEDURE: The procedure, risks, benefits, and alternatives were explained to the patient. Questions regarding the procedure were encouraged and answered. The patient understands and consents to the procedure. Previous imaging reviewed. Preliminary ultrasound performed. A hypoechoic solid lesion was localized in the right hepatic dome. Overlying skin marked for a right lateral intercostal approach. Under sterile conditions and local anesthesia, a 17 gauge 11.8 cm access needle was advanced percutaneously to the right hepatic lesion. Needle position confirmed with ultrasound. 18 gauge core biopsies obtained. Images obtained for documentation. Needle tract occluded with Gel-Foam. Postprocedure imaging demonstrates  no hemorrhage or hematoma. Patient tolerated the biopsy well. FINDINGS: Ultrasound imaging confirms needle placement to the right hepatic lesion for core biopsy. IMPRESSION: Successful ultrasound right hepatic mass 18 gauge core biopsy Electronically Signed   By: Jerilynn Mages.  Shick M.D.   On: 11/24/2017 13:58   Dg Duanne Limerick W/water Sol Cm  Result Date: 11/26/2017 CLINICAL DATA:   Gastric outlet obstruction. EXAM: WATER SOLUBLE UPPER GI SERIES TECHNIQUE: Single-column upper GI series was performed using water soluble contrast. CONTRAST:  210 cc of Omnipaque 300 injected through nasogastric tube. COMPARISON:  Body CT 11/21/2017 FLUOROSCOPY TIME:  Fluoroscopy Time:  42 seconds FINDINGS: Initial radiograph of the abdomen demonstrates distension of the stomach to the level of the gastric outlet. Enteric catheter terminates in the gastric body. Paucity of gas in the small bowel. Calcified mass in the pelvis may represent a uterine fibroid. 210 cc of Omnipaque 300 were slowly injected through pre-existing nasogastric tube. Opacification of the distended stomach with contrast was seen. Opacification was seen at the level of the pylorus and the proximal aspect of the first portion of the duodenum. No contrast was seen passing through the duodenum. IMPRESSION: Findings consistent with gastric outlet obstruction at the level of the first portion of the duodenum. Electronically Signed   By: Fidela Salisbury M.D.   On: 11/26/2017 09:15   Ir Picc Placement Right >5 Yrs Inc Img Guide  Result Date: 11/25/2017 INDICATION: Cataract.  Atrial fibrillation. EXAM: RIGHT UPPER EXTREMITY PICC LINE PLACEMENT WITH ULTRASOUND AND FLUOROSCOPIC GUIDANCE MEDICATIONS: None ANESTHESIA/SEDATION: None FLUOROSCOPY TIME:  Fluoroscopy Time:  minutes 18 seconds (3 mGy). COMPLICATIONS: None immediate. PROCEDURE: The patient was advised of the possible risks and complications and agreed to undergo the procedure. The patient was then brought to the angiographic suite for the procedure. The right arm was prepped with chlorhexidine, draped in the usual sterile fashion using maximum barrier technique (cap and mask, sterile gown, sterile gloves, large sterile sheet, hand hygiene and cutaneous antiseptic). Local anesthesia was attained by infiltration with 1% lidocaine. Ultrasound demonstrated patency of the basilic vein, and this  was documented with an image. Under real-time ultrasound guidance, this vein was accessed with a 21 gauge micropuncture needle and image documentation was performed. The needle was exchanged over a guidewire for a peel-away sheath through which a 41 cm 5 [place] double lumen power injectable PICC was advanced, and positioned with its tip at the lower SVC/right atrial junction. Fluoroscopy during the procedure and fluoro spot radiograph confirms appropriate catheter position. The catheter was flushed, secured to the skin with Prolene sutures, and covered with a sterile dressing. IMPRESSION: Successful placement of a right arm PICC with sonographic and fluoroscopic guidance. The catheter is ready for use. Electronically Signed   By: Marybelle Killings M.D.   On: 11/25/2017 13:26   [place] Ekg Site Rite  Result Date: 11/24/2017 If Site Rite image not attached, placement could not be confirmed due to current cardiac rhythm.   Labs:  CBC: Recent Labs    11/23/17 0230 11/24/17 0304 11/25/17 0327 11/26/17 0952  WBC 7.6 7.5 7.6 8.3  HGB 7.5* 7.8* 7.8* 8.2*  HCT 23.4* 24.3* 24.2* 25.6*  PLT 245 252 253 296    COAGS: Recent Labs    10/15/17 1110 11/21/17 1715 11/21/17 2352 11/24/17 0304  INR 1.04 1.33 1.30 1.15  APTT  --   --  32  --     BMP: Recent Labs    11/24/17 0304 11/25/17 0327 11/26/17 0524 11/27/17 7829  NA 137 136 136 135  K 3.3* 3.0* 3.5 4.0  CL 103 102 99* 99*  CO2 23 23 25 29   GLUCOSE 99 91 184* 211*  BUN 13 7 5* 9  CALCIUM 7.9* 7.9* 8.1* 8.2*  CREATININE 0.70 0.74 0.66 0.46  GFRNONAA >60 >60 >60 >60  GFRAA >60 >60 >60 >60    LIVER FUNCTION TESTS: Recent Labs    11/21/17 1715 11/24/17 0304 11/25/17 0327 11/26/17 0524  BILITOT 0.7 0.7 1.0 0.8  AST 20 15 17 16   ALT 22 14 15 15   ALKPHOS 132* 102 112 116  PROT 6.1* 4.6* 4.8* 5.2*  ALBUMIN 3.3* 2.4* 2.5* 2.5*    TUMOR MARKERS: No results for input(s): AFPTM, CEA, CA199, CHROMGRNA in the last 8760  hours.  Assessment and Plan:  Adenocarcinoma pancreaticobiliary  Gastric outlet obstruction Palliative-- for home hospice Scheduled for percutaneous gastric tube placement Mon Risks and benefits discussed with the patient's daughter Anna Cuevas via phone  including, but not limited to the need for a barium enema during the procedure, bleeding, infection, peritonitis, or damage to adjacent structures.  All of her questions were answered, she is agreeable to proceed. Consent signed and in chart.  Thank you for this interesting consult.  I greatly enjoyed meeting Anna Cuevas and look forward to participating in their care.  A copy of this report was sent to the requesting provider on this date.  Electronically Signed: Lavonia Drafts, PA-C 11/27/2017, 11:19 AM   I spent a total of 40 Minutes    in face to face in clinical consultation, greater than 50% of which was counseling/coordinating care for percutaneous gastric tube placement

## 2017-11-27 NOTE — Progress Notes (Signed)
[facility] Gastroenterology Progress Note  CYAN CLIPPINGER 77 y.o. January 03, 1941  CC:  Gastric outlet obstruction, nausea and vomiting. Possible metastatic gallbladder carcinoma   Subjective:  No acute events overnight. Daughter at bedside.   Objective: Vital signs in last 24 hours: Vitals:   11/27/17 1138 11/27/17 1200  BP: (!) 147/65   Pulse: 86 87  Resp: (!) 22 20  Temp: 98.5 F (36.9 C)   SpO2: 98% 98%    Physical Exam:  Gen. Alert and oriented 3. Some discomfort from NG tube. Abdomen. Soft, nontender, nondistended, bowel sounds present.  Lab Results: Recent Labs    11/26/17 0524 11/27/17 0738  NA 136 135  K 3.5 4.0  CL 99* 99*  CO2 25 29  GLUCOSE 184* 211*  BUN 5* 9  CREATININE 0.66 0.46  CALCIUM 8.1* 8.2*  MG 1.6* 1.7  PHOS 2.8 3.1   Recent Labs    11/25/17 0327 11/26/17 0524  AST 17 16  ALT 15 15  ALKPHOS 112 116  BILITOT 1.0 0.8  PROT 4.8* 5.2*  ALBUMIN 2.5* 2.5*   Recent Labs    11/25/17 0327 11/26/17 0952  WBC 7.6 8.3  NEUTROABS 5.9 6.8  HGB 7.8* 8.2*  HCT 24.2* 25.6*  MCV 80.9 80.3  PLT 253 296   No results for input(s): LABPROT, INR in the last 72 hours.    Assessment/Plan: - gastric outlet obstruction was likely from metastatic gallbladder carcinoma. EGD  showed duodenal bulb stenosis.  Biopsies positive for adenocarcinoma. Liver biopsy also positive for adenocarcinoma with pancreaticobiliary  Morphology.  upper GI series showed complete gastric outlet obstruction.no contrast was seen passing through the duodenum   - acute and chronic anemia. - choledocholithiasis with 3 mm stone into the common bile duct. Most likely incidental finding - Atrial fibrillation. Last dose of Eliquis 05/03 evening      Recommendations -------------------------- -  Patient declined repeat EGD.   G-tube placement by interventional radiology. GI will sign off. Call us back if needed   Otis Brace MD, [place] 11/27/2017, 2:46 PM  Contact #   [phone]

## 2017-11-27 NOTE — Progress Notes (Signed)
Nutrition Follow-up  DOCUMENTATION CODES:   Obesity unspecified  INTERVENTION:   -TPN management per pharmacy  NUTRITION DIAGNOSIS:   Inadequate oral intake related to altered GI function as evidenced by NPO status.  Ongoing  GOAL:   Patient will meet greater than or equal to 90% of their needs  Progressing; TPN not to advance due to CBGs (will likely advance to goal rate tomorrow 11/28/17)  MONITOR:   Labs, Weight trends, Skin, I & O's, Diet advancement  REASON FOR ASSESSMENT:   Consult New TPN/TNA  ASSESSMENT:   Anna Cuevas is a 77 y.o. female with medical history significant of atrial fibrillation on Eliquis, hypertension, diabetes mellitus, GERD, depression, who presents with hematemesis.  5/5- NGT placed 5/6- s/p EGD- revealed severe stenosis of duodenal bulb with possible malignant lesion 5/7- s/p liver mass bx 5/8- PICC placed and TPN initiated 5/9- s/p UGI- revealed complete gastric outlet obstruction (unable to place duodenal stent)  Case discussed with RN, who reports pt is tolerating TPN well. Plan to palliative g-tube placement (likely Monday) and home with hospice once medically stable.   Reviewed I/O's: +242 ml x 24 hours and +7.3 L since admission. Noted +1.8 L NGT output x 24 hours.   Pt remains on TPN; rate of 45 ml/hr (goal rate 70 ml/hr) which provides provide 58.32g AA, 172.8g CHO and 24.84g ILE for a total of 1069 kCal, meeting 66% of patient's needs. Per pharmacy notes, plan to increase to goal rate tomorrow evening (11/28/17), if CBGS are controlled.   Labs reviewed: CBGS: 250-037 (inpatient orders for glycemic control are 0-15 units insulin aspart TID with meals). K, Mg, and Phos WDL.   Diet Order:   Diet Order           Diet NPO time specified Except for: Sips with Meds  Diet effective midnight          EDUCATION NEEDS:   Education needs have been addressed  Skin:  Skin Assessment: Skin Integrity Issues: Skin Integrity Issues::  Incisions, Other (Comment), Stage II Stage II: buttocks Incisions: closed abdomen Other: healed pressure injury on buttocks  Last BM:  PTA  Height:   Ht Readings from Last 1 Encounters:  11/23/17 5\' 3"  (1.6 m)    Weight:   Wt Readings from Last 1 Encounters:  11/27/17 179 lb 7.3 oz (81.4 kg)    Ideal Body Weight:  52.3 kg  BMI:  Body mass index is 31.79 kg/m.  Estimated Nutritional Needs:   Kcal:  0488-8916  Protein:  90-105 grams  Fluid:  > 1.7 L    Derico Mitton A. Jimmye Norman, RD, LDN, CDE Pager: [phone] After hours Pager: [phone]

## 2017-11-27 NOTE — Progress Notes (Addendum)
PROGRESS NOTE    Anna Cuevas  GQQ:[id number] DOB: 02-20-1941 DOA: 11/21/2017 PCP: Kathyrn Lass, MD   Brief Narrative: Patient is a 77 year old female with past medical history of A. fib on Eliquis, hypertension, diabetes, GERD, depression who presented with hematemesis .She was recently admitted here 5 weeks ago and found to have new onset A. Fib  and was discharged on Eliquis.  Patient was having nausea, vomiting at home .Vomitus was dark-colored.  Patient underwent EGD on 5/6 and was found to have severe stenosis at  duodenal bulb.CT scan done here showed gallbladder mass, hepatic metastasis, severe distention of the stomach with gastric outlet obstruction. She underwent liver biopsy on 11/24/2017.  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  Oncology consulted .  No plan for chemotherapy but she may be a candidate for palliative radiation. IR consulted today for percutaneous G tube placement for palliation.  Assessment & Plan:   Principal Problem:   Gastric outlet obstruction with GB tumor Active Problems:   Atrial fibrillation with rapid ventricular response (HCC)   N&V (nausea and vomiting)   Diabetes mellitus without complication (HCC)   Pressure injury of skin   GIB (gastrointestinal bleeding)   Depression   Essential hypertension   GERD (gastroesophageal reflux disease)   Gallbladder carcinoma (HCC)   Blood loss anemia  Gastric outlet obstruction with gallbladder tumor/liver mets:  Plan for percutaneous G-tube placement for palliation on Monday.She is not a candidate for surgery. Oncology consulted .  Plan is to involve radiation oncology.  No role of chemotherapy at present.  Hospice also consulted by oncology. Patient unable to take by mouth.  On NG tube. On TPN.  Underwent liver biopsy by IR on 11/24/2017. Pathology from endoscopy and liver biopsy  confirmed adenocarcinoma most likely  from the hepatobiliary source likely gallbladder. Gastric outlet obstruction  secondary to gall bladder mass with invasion of liver and duodenum. Needs surgery vs stenting.  Continue NG tube for decompression.  Upper GI series shows complete gastric outlet obstruction so  stenting not possible as per GI. Elevated CEA and CA19-9 CT abdomen showed:Heterogeneous, ill-defined gallbladder invading the adjacent liver, consistent with gallbladder carcinoma. Several hepatic metastases measuring up to 2.0 cm,severe distention of the stomach with gastric outlet obstruction secondary to focal narrowing and invasion of the first portion of the duodenum by the gallbladder mass.  Hematemesis/acute upper GI bleed: Bleeding has stopped.  Will avoid blood thinners.Eliquis stopped.  Acute blood loss anemia: Status post 3 units of PRBC.  Hemoglobin in  the range of 8.  We will continue to monitor H&H.  Nutrition: Gastric stricture  is very tight as per EGD.  Oral intake is not possible at this time.   Hypokalemia/hypomagnesemia: Supplemented.  We will continue to monitor the levels  A. fib: Currently on normal sinus rhythm.  Rate controlled.  Not on anticoagulation due to hematemesis .  DM: We will continue to monitor blood sugars.  Hypertension: Currently blood pressure stable.  We will continue to monitor.    DVT prophylaxis: SCD Code Status: Full code Family Communication: Discussed with daughter on phone on 11/27/17 Disposition Plan:    Awaiting  management of gastric outlet obstruction.  Long-term plan on nutrition.   Consultants: General surgery, GI  Procedures: Liver biopsy on 11/24/2017  Antimicrobials: None   Subjective: Patient seen and examined at bedside this morning.  No new issues/events.  Looks comfortable.  Denies pain.  Objective:  Vitals:   11/27/17 0532 11/27/17 0800 11/27/17 0830 11/27/17 1138  BP:   140/69 (!) 147/65  Pulse:  80 83 86  Resp:  (!) 24 (!) 24 (!) 22  Temp:    98.3 F (36.8 C) 98.5 F (36.9 C)  TempSrc:   Oral Oral  SpO2:  98% 98% 98%  Weight: 81.4 kg (179 lb 7.3 oz)     Height:        Intake/Output Summary (Last 24 hours) at 11/27/2017 1213 Last data filed at 11/27/2017 1140 Gross per 24 hour  Intake 2100 ml  Output 1650 ml  Net 450 ml   Filed Weights   11/22/17 1200 11/23/17 1111 11/27/17 0532  Weight: 81.4 kg (179 lb 7.3 oz) 81.2 kg (179 lb) 81.4 kg (179 lb 7.3 oz)    Examination:  General exam: Appears calm and comfortable ,Not in distress,average built HEENT:PERRL,Oral mucosa moist, Ear/Nose normal on gross exam Respiratory system: Bilateral equal air entry, normal vesicular breath sounds, no wheezes or crackles  Cardiovascular system: S1 & S2 heard, RRR. No JVD, murmurs, rubs, gallops or clicks. Gastrointestinal system: Abdomen is nondistended, soft and nontender. No organomegaly or masses felt. Normal bowel sounds heard.NG tube Central nervous system: Alert and oriented. No focal neurological deficits. Extremities: No edema, no clubbing ,no cyanosis, distal peripheral pulses palpable. Skin: No rashes, lesions or ulcers,no icterus ,no pallor   Data Reviewed: I have personally reviewed following labs and imaging studies  CBC: Recent Labs  Lab 11/21/17 1715  11/22/17 1754 11/23/17 0230 11/24/17 0304 11/25/17 0327 11/26/17 0952  WBC 12.0*   < > 8.4 7.6 7.5 7.6 8.3  NEUTROABS 10.6*  --   --   --   --  5.9 6.8  HGB 7.5*   < > 6.6* 7.5* 7.8* 7.8* 8.2*  HCT 23.9*   < > 20.6* 23.4* 24.3* 24.2* 25.6*  MCV 80.2   < > 80.8 81.8 82.1 80.9 80.3  PLT 467*   < > 294 245 252 253 296   < > = values in this interval not displayed.   Basic Metabolic Panel: Recent Labs  Lab 11/23/17 0230 11/24/17 0304 11/25/17 0327 11/26/17 0524 11/27/17 0738  NA 139 137 136 136 135  K 3.2* 3.3* 3.0* 3.5 4.0  CL 103 103 102 99* 99*  CO2 25 23 23 25 29   GLUCOSE 133* 99 91 184* 211*  BUN 21* 13 7 5* 9  CREATININE 0.76 0.70 0.74 0.66 0.46    CALCIUM 8.0* 7.9* 7.9* 8.1* 8.2*  MG  --   --  1.1* 1.6* 1.7  PHOS  --   --  2.8 2.8 3.1   GFR: Estimated Creatinine Clearance: 59.5 mL/min (by C-G formula based on SCr of 0.46 mg/dL). Liver Function Tests: Recent Labs  Lab 11/21/17 1715 11/24/17 0304 11/25/17 0327 11/26/17 0524  AST 20 15 17 16   ALT 22 14 15 15   ALKPHOS 132* 102 112 116  BILITOT 0.7 0.7 1.0 0.8  PROT 6.1* 4.6* 4.8* 5.2*  ALBUMIN 3.3* 2.4* 2.5* 2.5*   Recent Labs  Lab 11/21/17 1715  LIPASE 52*   No results for input(s): AMMONIA in the last 168 hours. Coagulation Profile: Recent Labs  Lab 11/21/17 1715 11/21/17 2352 11/24/17 0304  INR 1.33 1.30 1.15   Cardiac Enzymes: No results for input(s): CKTOTAL, CKMB, CKMBINDEX, TROPONINI in the last 168 hours. BNP (last 3 results) No results for input(s): PROBNP in the last 8760 hours. HbA1C: No results for  input(s): HGBA1C in the last 72 hours. CBG: Recent Labs  Lab 11/26/17 1953 11/26/17 2342 11/27/17 0334 11/27/17 0833 11/27/17 1135  GLUCAP 203* 193* 207* 199* 200*   Lipid Profile: Recent Labs    11/25/17 0327  TRIG 148   Thyroid Function Tests: No results for input(s): TSH, T4TOTAL, FREET4, T3FREE, THYROIDAB in the last 72 hours. Anemia Panel: No results for input(s): VITAMINB12, FOLATE, FERRITIN, TIBC, IRON, RETICCTPCT in the last 72 hours. Sepsis Labs: Recent Labs  Lab 11/21/17 1742  LATICACIDVEN 1.15    Recent Results (from the past 240 hour(s))  Culture, blood (Routine X 2) w Reflex to ID Panel     Status: None   Collection Time: 11/22/17  6:05 AM  Result Value Ref Range Status   Specimen Description BLOOD LEFT HAND  Final   Special Requests   Final    BOTTLES DRAWN AEROBIC AND ANAEROBIC Blood Culture adequate volume   Culture   Final    NO GROWTH 5 DAYS Performed at [facility], [place] [address], [place], [place] [address]    Report Status 11/27/2017 FINAL  Final  Culture, blood (Routine X 2) w Reflex to ID Panel      Status: None   Collection Time: 11/22/17  6:13 AM  Result Value Ref Range Status   Specimen Description BLOOD LEFT ANTECUBITAL  Final   Special Requests   Final    BOTTLES DRAWN AEROBIC AND ANAEROBIC Blood Culture adequate volume   Culture   Final    NO GROWTH 5 DAYS Performed at [facility], [place] [address], [place], [place] [address]    Report Status 11/27/2017 FINAL  Final  MRSA PCR Screening     Status: None   Collection Time: 11/22/17  1:44 PM  Result Value Ref Range Status   MRSA by PCR NEGATIVE NEGATIVE Final    Comment:        The GeneXpert MRSA Assay (FDA approved for NASAL specimens only), is one component of a comprehensive MRSA colonization surveillance program. It is not intended to diagnose MRSA infection nor to guide or monitor treatment for MRSA infections. Performed at [facility], [place] [address] [place], [place] [address]          Radiology Studies: Dg Ugi W/water Sol Cm  Result Date: 11/26/2017 CLINICAL DATA:  Gastric outlet obstruction. EXAM: WATER SOLUBLE UPPER GI SERIES TECHNIQUE: Single-column upper GI series was performed using water soluble contrast. CONTRAST:  210 cc of Omnipaque 300 injected through nasogastric tube. COMPARISON:  Body CT 11/21/2017 FLUOROSCOPY TIME:  Fluoroscopy Time:  42 seconds FINDINGS: Initial radiograph of the abdomen demonstrates distension of the stomach to the level of the gastric outlet. Enteric catheter terminates in the gastric body. Paucity of gas in the small bowel. Calcified mass in the pelvis may represent a uterine fibroid. 210 cc of Omnipaque 300 were slowly injected through pre-existing nasogastric tube. Opacification of the distended stomach with contrast was seen. Opacification was seen at the level of the pylorus and the proximal aspect of the first portion of the duodenum. No contrast was seen passing through the duodenum. IMPRESSION: Findings consistent with gastric outlet obstruction at the level of  the first portion of the duodenum. Electronically Signed   By: Fidela Salisbury M.D.   On: 11/26/2017 09:15   Ir Picc Placement Right >5 Yrs Inc Img Guide  Result Date: 11/25/2017 INDICATION: Cataract.  Atrial fibrillation. EXAM: RIGHT UPPER EXTREMITY PICC LINE PLACEMENT WITH  ULTRASOUND AND FLUOROSCOPIC GUIDANCE MEDICATIONS: None ANESTHESIA/SEDATION: None FLUOROSCOPY TIME:  Fluoroscopy Time:  minutes 18 seconds (3 mGy). COMPLICATIONS: None immediate. PROCEDURE: The patient was advised of the possible risks and complications and agreed to undergo the procedure. The patient was then brought to the angiographic suite for the procedure. The right arm was prepped with chlorhexidine, draped in the usual sterile fashion using maximum barrier technique (cap and mask, sterile gown, sterile gloves, large sterile sheet, hand hygiene and cutaneous antiseptic). Local anesthesia was attained by infiltration with 1% lidocaine. Ultrasound demonstrated patency of the basilic vein, and this was documented with an image. Under real-time ultrasound guidance, this vein was accessed with a 21 gauge micropuncture needle and image documentation was performed. The needle was exchanged over a guidewire for a peel-away sheath through which a 41 cm 5 [place] double lumen power injectable PICC was advanced, and positioned with its tip at the lower SVC/right atrial junction. Fluoroscopy during the procedure and fluoro spot radiograph confirms appropriate catheter position. The catheter was flushed, secured to the skin with Prolene sutures, and covered with a sterile dressing. IMPRESSION: Successful placement of a right arm PICC with sonographic and fluoroscopic guidance. The catheter is ready for use. Electronically Signed   By: Marybelle Killings M.D.   On: 11/25/2017 13:26        Scheduled Meds: . heparin injection (subcutaneous)  5,000 Units Subcutaneous Q8H  . insulin aspart  0-15 Units Subcutaneous Q4H  . mouth rinse  15 mL Mouth  Rinse BID  . metoprolol tartrate  5 mg Intravenous Q6H   Continuous Infusions: . sodium chloride 55 mL/hr at 11/27/17 0406  . TPN ADULT (ION) 45 mL/hr at 11/26/17 1734  . TPN ADULT (ION)       LOS: 6 days    Time spent: 25 mins.More than 50% of that time was spent in counseling and/or coordination of care.      Shelly Coss, MD [facility] Pager [phone]  If 7PM-7AM, please contact night-coverage [email or web address] Password [email or web address] 11/27/2017, 12:13 PM

## 2017-11-27 NOTE — Progress Notes (Signed)
OT Cancellation Note and Discharge  Patient Details Name: Anna Cuevas MRN: [id number] DOB: 1940-09-05   Cancelled Treatment:    Reason Eval/Treat Not Completed: Other (comment). Received order, noted current issues of pt in chart, spoke to dtr who was in pt's room and she does not feel OT is needed--her husband is an OT and they will be caring for pt at home with hospice. I also asked about continuing PT and dtr felt it best not too, have made evaluating PT aware.  Almon Register [id number] 11/27/2017, 1:36 PM

## 2017-11-27 NOTE — Progress Notes (Signed)
PHARMACY - ADULT TOTAL PARENTERAL NUTRITION CONSULT NOTE   Pharmacy Consult:  TPN Indication:  GOO  Patient Measurements: Height: 5\' 3"  (160 cm) Weight: 179 lb 7.3 oz (81.4 kg) IBW/kg (Calculated) : 52.4 TPN AdjBW (KG): 59.6 Body mass index is 31.79 kg/m.  Assessment:  55 YOF presented on 11/21/17 with hematemesis.  Patient was hospitalized 5 weeks ago d/t N/V and continued to have those symptoms post discharge.  CT concerning for GOO and gallbladder carcinoma with hepatic mets as well as choledocholithiasis.  She is s/p EGD with duodenal biopsy on 5/6 and liver biopsy on 11/24/17.  Given expected prolonged NPO status and while work-up is in progress, pharmacy consulted to initiate TPN.  Patient reported that she gained weight instead of losing weight, and was eating about 50% of her meals for 2 weeks PTA. She is at risk for refeeding syndrome.  GI: baseline prealbumin low at 10, NG O/P 100 mL (brown) - Protonix gtt Endo: DM on metformin PTA - CBGs trending up (160-207) Insulin requirements in the past 24 hours: 21 unit SSI (19 units since new TPN bag hung) Lytes: K 4 (s/p 5 runs yesterday), Mag 1.7 (s/p 2gm x2 days), CoCa 9.3, others low normal Renal: SCr 0.66 stable, BUN WNL - UOP 0.5 ml/kg/hr, NS at 75 ml/hr Pulm: stable on RA Cards: HTN - BP elevated, HR controlled - IV metoprolol, PRN hydralazine AC: Eliquis PTA for Afib dx in March, s/p KCentra on 5/4 - hgb low at 7.8, plts WNL Hepatobil: LFTs / tbili / TG WNL Neuro: A&O - PRN APAP ID: [email or web address], WBC WNL - not on abx TPN Access: PICC to be placed 5/8 TPN start date: 11/25/17  Nutritional Goals (RD rec pending): 1600-1700 kCal and 85-100 gm protein per day, fluid >2L   Current Nutrition:  NPO  Plan:  Continue TPN at 45 ml/hr (goal rate 70 ml/hr) until CBGs controlled, will plan to increase to goal tomorrow. TPN will provide 58.32g AA, 172.8g CHO and 24.84g ILE for a total of 1069 kCal, meeting 66% of patient's  needs. Electrolytes in TPN: continue increased Na/K/Phos/Mag, standard calcium, Cl:Ac 2:1 Daily multivitamin and trace elements in TPN Continue moderate SSI Q4H Add 10 units of regular insulin to TPN Continue NS at 55 ml/hr with current rate of TPN F/U AM labs   Sloan Leiter, PharmD, BCPS, BCCCP Clinical Pharmacist Clinical phone 11/27/2017 until 3:30PM - #[phone] After hours, please call [phone] 11/27/2017, 7:32 AM

## 2017-11-28 LAB — PHOSPHORUS: PHOSPHORUS: 5.4 mg/dL — AB (ref 2.5–4.6)

## 2017-11-28 LAB — BASIC METABOLIC PANEL
ANION GAP: 7 (ref 5–15)
Anion gap: 5 (ref 5–15)
BUN: 11 mg/dL (ref 6–20)
BUN: 11 mg/dL (ref 6–20)
CALCIUM: 8.2 mg/dL — AB (ref 8.9–10.3)
CALCIUM: 8.2 mg/dL — AB (ref 8.9–10.3)
CO2: 29 mmol/L (ref 22–32)
CO2: 30 mmol/L (ref 22–32)
CREATININE: 0.52 mg/dL (ref 0.44–1.00)
Chloride: 100 mmol/L — ABNORMAL LOW (ref 101–111)
Chloride: 99 mmol/L — ABNORMAL LOW (ref 101–111)
Creatinine, Ser: 0.48 mg/dL (ref 0.44–1.00)
GFR calc non Af Amer: >60 mL/min (ref >60)
GFR calc non Af Amer: >60 mL/min (ref >60)
Glucose, Bld: 533 mg/dL (ref 65–99)
Glucose, Bld: 182 mg/dL — ABNORMAL HIGH (ref 65–99)
POTASSIUM: 3.9 mmol/L (ref 3.5–5.1)
Potassium: 6.1 mmol/L — ABNORMAL HIGH (ref 3.5–5.1)
SODIUM: 134 mmol/L — AB (ref 135–145)
SODIUM: 136 mmol/L (ref 135–145)

## 2017-11-28 LAB — GLUCOSE, CAPILLARY
GLUCOSE-CAPILLARY: 182 mg/dL — AB (ref 65–99)
GLUCOSE-CAPILLARY: 203 mg/dL — AB (ref 65–99)
GLUCOSE-CAPILLARY: 196 mg/dL — AB (ref 65–99)
GLUCOSE-CAPILLARY: 195 mg/dL — AB (ref 65–99)
GLUCOSE-CAPILLARY: 201 mg/dL — AB (ref 65–99)
Glucose-Capillary: 489 mg/dL — ABNORMAL HIGH (ref 65–99)

## 2017-11-28 LAB — CBC WITH DIFFERENTIAL/PLATELET
BASOS ABS: 0 10*3/uL (ref 0.0–0.1)
Basophils Relative: 0 %
EOS PCT: 4 %
Eosinophils Absolute: 0.2 10*3/uL (ref 0.0–0.7)
HEMATOCRIT: 23.9 % — AB (ref 36.0–46.0)
Hemoglobin: 7.6 g/dL — ABNORMAL LOW (ref 12.0–15.0)
LYMPHS PCT: 16 %
Lymphs Abs: 0.9 10*3/uL (ref 0.7–4.0)
MCH: 26.3 pg (ref 26.0–34.0)
MCHC: 31.8 g/dL (ref 30.0–36.0)
MCV: 82.7 fL (ref 78.0–100.0)
MONOS PCT: 8 %
Monocytes Absolute: 0.5 10*3/uL (ref 0.1–1.0)
NEUTROS ABS: 4.3 10*3/uL (ref 1.7–7.7)
Neutrophils Relative %: 72 %
PLATELETS: 233 10*3/uL (ref 150–400)
RBC: 2.89 MIL/uL — ABNORMAL LOW (ref 3.87–5.11)
RDW: 14.8 % (ref 11.5–15.5)
WBC: 6 10*3/uL (ref 4.0–10.5)

## 2017-11-28 LAB — MAGNESIUM: MAGNESIUM: 2.3 mg/dL (ref 1.7–2.4)

## 2017-11-28 MED ORDER — SODIUM CHLORIDE 0.9 % IV SOLN: INTRAVENOUS | Status: DC

## 2017-11-28 MED ORDER — TRAVASOL 10 % IV SOLN
INTRAVENOUS | Status: AC
  Administered 2017-11-28: 17:00:00 via INTRAVENOUS
  Filled 2017-11-28: qty 907.2

## 2017-11-28 NOTE — Progress Notes (Signed)
PROGRESS NOTE    Anna Cuevas  VWU:[id number] DOB: 1941/05/22 DOA: 11/21/2017 PCP: Kathyrn Lass, MD   Brief Narrative: Patient is a 77 year old female with past medical history of A. fib on Eliquis, hypertension, diabetes, GERD, depression who presented with hematemesis .She was recently admitted here 5 weeks ago and found to have new onset A. Fib  and was discharged on Eliquis.  Patient was having nausea, vomiting at home .Vomitus was dark-colored.  Patient underwent EGD on 5/6 and was found to have severe stenosis at  duodenal bulb.CT scan done here showed gallbladder mass, hepatic metastasis, severe distention of the stomach with gastric outlet obstruction. She underwent liver biopsy on 11/24/2017.  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  Oncology consulted .  No plan for chemotherapy but she may be a candidate for palliative radiation. IR consulted  for percutaneous G tube placement for palliation.Plan is for Monday.  Assessment & Plan:   Principal Problem:   Gastric outlet obstruction with GB tumor Active Problems:   Atrial fibrillation with rapid ventricular response (HCC)   N&V (nausea and vomiting)   Diabetes mellitus without complication (HCC)   Pressure injury of skin   GIB (gastrointestinal bleeding)   Depression   Essential hypertension   GERD (gastroesophageal reflux disease)   Gallbladder carcinoma (HCC)   Blood loss anemia   Goals of care, counseling/discussion   Palliative care by specialist  Gastric outlet obstruction with gallbladder tumor/liver mets:  Plan for percutaneous G-tube placement for palliation on Monday.She is not a candidate for surgery. Oncology consulted .  Plan is to involve radiation oncology.  No role of chemotherapy at present.  Hospice also consulted by oncology. Patient unable to take by mouth.  On NG tube. On TPN.  Underwent liver biopsy by IR on  11/24/2017. Pathology from endoscopy and liver biopsy  confirmed adenocarcinoma most likely from the hepatobiliary source likely gallbladder. Gastric outlet obstruction  secondary to gall bladder mass with invasion of liver and duodenum. Needs surgery vs stenting.  Continue NG tube for decompression.  Upper GI series shows complete gastric outlet obstruction so  stenting not possible as per GI. Elevated CEA and CA19-9 CT abdomen showed:Heterogeneous, ill-defined gallbladder invading the adjacent liver, consistent with gallbladder carcinoma. Several hepatic metastases measuring up to 2.0 cm,severe distention of the stomach with gastric outlet obstruction secondary to focal narrowing and invasion of the first portion of the duodenum by the gallbladder mass.  Hematemesis/acute upper GI bleed: Bleeding has stopped.  Will avoid blood thinners.Eliquis stopped.  Acute blood loss anemia: Status post 3 units of PRBC.  Hemoglobin in  the range of 7.  We will continue to monitor H&H.  We will transfuse if needed.  Nutrition: Gastric stricture  is very tight as per EGD.  Oral intake is not possible at this time.   Hypokalemia/hypomagnesemia: Supplemented.  We will continue to monitor the levels  A. fib: Currently on normal sinus rhythm.  Rate controlled.  Not on anticoagulation due to hematemesis .  DM: We will continue to monitor blood sugars.  Hypertension: Currently blood pressure stable.  We will continue to monitor.    DVT prophylaxis: SCD Code Status: Full code Family Communication: Discussed with daughter on phone on 11/27/17 Disposition Plan:    Awaiting  management of gastric outlet obstruction.Plan for gastrostomy tube on Monday. long-term plan on nutrition.   Consultants: General surgery, GI  Procedures: Liver biopsy on 11/24/2017  Antimicrobials: None   Subjective: Patient seen and examined at bedside this morning.  No new issues/events.  Remains comfortable ,denies any abdominal  pain. Objective: Vitals:   11/27/17 2343 11/28/17 0407 11/28/17 0819 11/28/17 1148  BP: (!) 156/80 (!) 155/59 (!) 159/71 (!) 153/77  Pulse: 76 84 83 93  Resp: (!) 21 (!) 26 17 (!) 26  Temp: 98.6 F (37 C)  98.6 F (37 C) 98.9 F (37.2 C)  TempSrc: Axillary Oral Oral Oral  SpO2: 96% 98% 96% 97%  Weight:      Height:        Intake/Output Summary (Last 24 hours) at 11/28/2017 1331 Last data filed at 11/28/2017 1200 Gross per 24 hour  Intake 2402.25 ml  Output 1355 ml  Net 1047.25 ml   Filed Weights   11/22/17 1200 11/23/17 1111 11/27/17 0532  Weight: 81.4 kg (179 lb 7.3 oz) 81.2 kg (179 lb) 81.4 kg (179 lb 7.3 oz)    Examination:  General exam: Appears calm and comfortable ,Not in distress,average built HEENT:PERRL,Oral mucosa moist, Ear/Nose normal on gross exam Respiratory system: Bilateral equal air entry, normal vesicular breath sounds, no wheezes or crackles  Cardiovascular system: S1 & S2 heard, RRR. No JVD, murmurs, rubs, gallops or clicks. Gastrointestinal system: Abdomen is nondistended, soft and nontender. No organomegaly or masses felt. Normal bowel sounds heard.NG tube Central nervous system: Alert and oriented. No focal neurological deficits. Extremities: No edema, no clubbing ,no cyanosis, distal peripheral pulses palpable. Skin: No rashes, lesions or ulcers,no icterus ,no pallor   Data Reviewed: I have personally reviewed following labs and imaging studies  CBC: Recent Labs  Lab 11/21/17 1715  11/23/17 0230 11/24/17 0304 11/25/17 0327 11/26/17 0952 11/28/17 0410  WBC 12.0*   < > 7.6 7.5 7.6 8.3 6.0  NEUTROABS 10.6*  --   --   --  5.9 6.8 4.3  HGB 7.5*   < > 7.5* 7.8* 7.8* 8.2* 7.6*  HCT 23.9*   < > 23.4* 24.3* 24.2* 25.6* 23.9*  MCV 80.2   < > 81.8 82.1 80.9 80.3 82.7  PLT 467*   < > 245 252 253 296 233   < > = values in this interval not displayed.   Basic Metabolic Panel: Recent Labs  Lab 11/25/17 0327 11/26/17 0524 11/27/17 0738  11/28/17 0410 11/28/17 0513  NA 136 136 135 134* 136  K 3.0* 3.5 4.0 6.1* 3.9  CL 102 99* 99* 100* 99*  CO2 23 25 29 29 30   GLUCOSE 91 184* 211* 533* 182*  BUN 7 5* 9 11 11   CREATININE 0.74 0.66 0.46 0.52 0.48  CALCIUM 7.9* 8.1* 8.2* 8.2* 8.2*  MG 1.1* 1.6* 1.7 2.3  --   PHOS 2.8 2.8 3.1 5.4*  --    GFR: Estimated Creatinine Clearance: 59.5 mL/min (by C-G formula based on SCr of 0.48 mg/dL). Liver Function Tests: Recent Labs  Lab 11/21/17 1715 11/24/17 0304 11/25/17 0327 11/26/17 0524  AST 20 15 17 16   ALT 22 14 15 15   ALKPHOS 132* 102 112 116  BILITOT 0.7 0.7 1.0 0.8  PROT 6.1* 4.6* 4.8* 5.2*  ALBUMIN 3.3* 2.4* 2.5* 2.5*   Recent Labs  Lab 11/21/17 1715  LIPASE 52*   No results for input(s): AMMONIA in the last 168 hours. Coagulation Profile: Recent Labs  Lab 11/21/17 1715 11/21/17 2352 11/24/17 0304  INR 1.33 1.30 1.15   Cardiac Enzymes: No results for input(s): CKTOTAL, CKMB, CKMBINDEX, TROPONINI in the last 168  hours. BNP (last 3 results) No results for input(s): PROBNP in the last 8760 hours. HbA1C: No results for input(s): HGBA1C in the last 72 hours. CBG: Recent Labs  Lab 11/27/17 2349 11/28/17 0358 11/28/17 0400 11/28/17 0817 11/28/17 1240  GLUCAP 175* 489* 182* 203* 196*   Lipid Profile: No results for input(s): CHOL, HDL, LDLCALC, TRIG, CHOLHDL, LDLDIRECT in the last 72 hours. Thyroid Function Tests: No results for input(s): TSH, T4TOTAL, FREET4, T3FREE, THYROIDAB in the last 72 hours. Anemia Panel: No results for input(s): VITAMINB12, FOLATE, FERRITIN, TIBC, IRON, RETICCTPCT in the last 72 hours. Sepsis Labs: Recent Labs  Lab 11/21/17 1742  LATICACIDVEN 1.15    Recent Results (from the past 240 hour(s))  Culture, blood (Routine X 2) w Reflex to ID Panel     Status: None   Collection Time: 11/22/17  6:05 AM  Result Value Ref Range Status   Specimen Description BLOOD LEFT HAND  Final   Special Requests   Final    BOTTLES DRAWN  AEROBIC AND ANAEROBIC Blood Culture adequate volume   Culture   Final    NO GROWTH 5 DAYS Performed at [facility], [place] [address], [place], [place] [address]    Report Status 11/27/2017 FINAL  Final  Culture, blood (Routine X 2) w Reflex to ID Panel     Status: None   Collection Time: 11/22/17  6:13 AM  Result Value Ref Range Status   Specimen Description BLOOD LEFT ANTECUBITAL  Final   Special Requests   Final    BOTTLES DRAWN AEROBIC AND ANAEROBIC Blood Culture adequate volume   Culture   Final    NO GROWTH 5 DAYS Performed at [facility], [place] [address] [place], [place] [address]    Report Status 11/27/2017 FINAL  Final  MRSA PCR Screening     Status: None   Collection Time: 11/22/17  1:44 PM  Result Value Ref Range Status   MRSA by PCR NEGATIVE NEGATIVE Final    Comment:        The GeneXpert MRSA Assay (FDA approved for NASAL specimens only), is one component of a comprehensive MRSA colonization surveillance program. It is not intended to diagnose MRSA infection nor to guide or monitor treatment for MRSA infections. Performed at [facility], [place] [address], [place], [place] [address]          Radiology Studies: No results found.      Scheduled Meds: . heparin injection (subcutaneous)  5,000 Units Subcutaneous Q8H  . insulin aspart  0-15 Units Subcutaneous Q4H  . mouth rinse  15 mL Mouth Rinse BID  . metoprolol tartrate  5 mg Intravenous Q6H   Continuous Infusions: . sodium chloride 55 mL/hr at 11/28/17 1200  . sodium chloride    . TPN ADULT (ION) 45 mL/hr at 11/28/17 1200  . TPN ADULT (ION)       LOS: 7 days    Time spent: 25 mins.More than 50% of that time was spent in counseling and/or coordination of care.      Shelly Coss, MD [facility] Pager [phone]  If 7PM-7AM, please contact night-coverage [email or web address] Password [email or web address] 11/28/2017, 1:31 PM

## 2017-11-28 NOTE — Progress Notes (Signed)
PHARMACY - ADULT TOTAL PARENTERAL NUTRITION CONSULT NOTE   Pharmacy Consult:  TPN Indication:  GOO  Patient Measurements: Height: 5\' 3"  (160 cm) Weight: 179 lb 7.3 oz (81.4 kg) IBW/kg (Calculated) : 52.4 TPN AdjBW (KG): 59.6 Body mass index is 31.79 kg/m.  Assessment:  73 YOF presented on 11/21/17 with hematemesis.  Patient was hospitalized 5 weeks ago d/t N/V and continued to have those symptoms post discharge.  CT concerning for GOO and gallbladder carcinoma with hepatic mets as well as choledocholithiasis.  She is s/p EGD with duodenal biopsy on 5/6 and liver biopsy on 11/24/17.  Given expected prolonged NPO status and while work-up is in progress, pharmacy consulted to initiate TPN.  Patient reported that she gained weight instead of losing weight, and was eating about 50% of her meals for 2 weeks PTA. She is at risk for refeeding syndrome.  GI: baseline prealbumin low at 10, NG O/P 100 mL (brown) - Protonix gtt. Gastric stricture is very tight per EGD and patient refusing repeat EGD. Venting G-tube placement on Monday for palliation. Endo: DM on metformin PTA - CBGs 175-194 Insulin requirements in the past 24 hours: 18 unit SSI (9 units since new TPN bag) + 10 units of regular insulin in TPN Lytes: K 3.9 (on repeat), Mag 2.3 and Phos high but sample appeared to have TPN as GLU was 533, CoCa 9.4, Cl low, others low normal Renal: SCr 0.48 stable, BUN WNL - UOP 0.2 ml/kg/hr, NS at 75 ml/hr Pulm: stable on RA Cards: HTN - BP elevated, HR controlled - IV metoprolol, PRN hydralazine AC: Eliquis PTA for Afib dx in March, s/p KCentra on 5/4 - hgb low at 7.6, plts WNL Hepatobil: LFTs / tbili / TG WNL Neuro: A&O - PRN APAP ID: [email or web address], WBC WNL - not on abx TPN Access: PICC to be placed 5/8 TPN start date: 11/25/17  Nutritional Goals (RD rec pending): 1600-1700 kCal and 85-100 gm protein per day, fluid >2L   Current Nutrition:  NPO  Plan:  Increase TPN to 70 ml/hr (goal rate 70)  TPN  will provide 90.72g AA, 269g CHO and 38.7g ILE for a total of 1664 kCal, meeting 100% of patient's needs. Electrolytes in TPN: continue increased Na/K/Mg, slightly reduce Phos, standard calcium, Cl:Ac 2:1 Daily multivitamin and trace elements in TPN Continue moderate SSI Q4H Increase to 15 units of regular insulin to TPN Reduce NS to 20 ml/hr with increased rate of TPN at 1800 F/U AM labs   Sloan Leiter, PharmD, BCPS, BCCCP Clinical Pharmacist Clinical phone 11/28/2017 until 3:30PM - #[phone] After hours, please call [phone] 11/28/2017, 7:17 AM

## 2017-11-28 NOTE — Progress Notes (Signed)
PT Cancellation Note  Patient Details Name: Anna Cuevas MRN: [id number] DOB: Oct 10, 1940   Cancelled Treatment:     Per chart and communication with OT, patient will be returning home with care of OT family member. MD asked PT to discontinue.   Reinaldo Berber, PT, DPT Acute Rehab Services Pager: [phone]       Reinaldo Berber 11/28/2017, 7:44 AM

## 2017-11-29 LAB — GLUCOSE, CAPILLARY
GLUCOSE-CAPILLARY: 241 mg/dL — AB (ref 65–99)
GLUCOSE-CAPILLARY: 211 mg/dL — AB (ref 65–99)
GLUCOSE-CAPILLARY: 268 mg/dL — AB (ref 65–99)
Glucose-Capillary: 179 mg/dL — ABNORMAL HIGH (ref 65–99)
Glucose-Capillary: 183 mg/dL — ABNORMAL HIGH (ref 65–99)
Glucose-Capillary: 193 mg/dL — ABNORMAL HIGH (ref 65–99)

## 2017-11-29 LAB — CBC
HCT: 31.3 % — ABNORMAL LOW (ref 36.0–46.0)
Hemoglobin: 9.9 g/dL — ABNORMAL LOW (ref 12.0–15.0)
MCH: 25.7 pg — ABNORMAL LOW (ref 26.0–34.0)
MCHC: 31.6 g/dL (ref 30.0–36.0)
MCV: 81.3 fL (ref 78.0–100.0)
Platelets: 269 10*3/uL (ref 150–400)
RBC: 3.85 MIL/uL — ABNORMAL LOW (ref 3.87–5.11)
RDW: 14.7 % (ref 11.5–15.5)
WBC: 10.2 10*3/uL (ref 4.0–10.5)

## 2017-11-29 LAB — CBC WITH DIFFERENTIAL/PLATELET
BASOS ABS: 0 10*3/uL (ref 0.0–0.1)
BASOS PCT: 0 %
BASOS PCT: 0 %
Basophils Absolute: 0 10*3/uL (ref 0.0–0.1)
EOS ABS: 0.2 10*3/uL (ref 0.0–0.7)
EOS PCT: 3 %
Eosinophils Absolute: 0.2 10*3/uL (ref 0.0–0.7)
Eosinophils Relative: 2 %
HCT: 24.9 % — ABNORMAL LOW (ref 36.0–46.0)
HCT: 35 % — ABNORMAL LOW (ref 36.0–46.0)
HEMOGLOBIN: 11.1 g/dL — AB (ref 12.0–15.0)
Hemoglobin: 7.7 g/dL — ABNORMAL LOW (ref 12.0–15.0)
LYMPHS ABS: 1.5 10*3/uL (ref 0.7–4.0)
LYMPHS PCT: 16 %
Lymphocytes Relative: 14 %
Lymphs Abs: 1 10*3/uL (ref 0.7–4.0)
MCH: 25.2 pg — ABNORMAL LOW (ref 26.0–34.0)
MCH: 25.7 pg — ABNORMAL LOW (ref 26.0–34.0)
MCHC: 30.9 g/dL (ref 30.0–36.0)
MCHC: 31.7 g/dL (ref 30.0–36.0)
MCV: 81.6 fL (ref 78.0–100.0)
MCV: 81 fL (ref 78.0–100.0)
MONO ABS: 0.6 10*3/uL (ref 0.1–1.0)
Monocytes Absolute: 0.9 10*3/uL (ref 0.1–1.0)
Monocytes Relative: 9 %
Monocytes Relative: 9 %
NEUTROS ABS: 4.6 10*3/uL (ref 1.7–7.7)
NEUTROS PCT: 75 %
Neutro Abs: 8 10*3/uL — ABNORMAL HIGH (ref 1.7–7.7)
Neutrophils Relative %: 72 %
PLATELETS: 252 10*3/uL (ref 150–400)
Platelets: 340 10*3/uL (ref 150–400)
RBC: 3.05 MIL/uL — AB (ref 3.87–5.11)
RBC: 4.32 MIL/uL (ref 3.87–5.11)
RDW: 15 % (ref 11.5–15.5)
RDW: 14.6 % (ref 11.5–15.5)
WBC: 6.4 10*3/uL (ref 4.0–10.5)
WBC: 10.7 10*3/uL — ABNORMAL HIGH (ref 4.0–10.5)

## 2017-11-29 LAB — MAGNESIUM
MAGNESIUM: 1.8 mg/dL (ref 1.7–2.4)
Magnesium: 2 mg/dL (ref 1.7–2.4)

## 2017-11-29 LAB — BASIC METABOLIC PANEL
ANION GAP: 7 (ref 5–15)
Anion gap: 7 (ref 5–15)
BUN: 16 mg/dL (ref 6–20)
BUN: 25 mg/dL — ABNORMAL HIGH (ref 6–20)
CALCIUM: 8.4 mg/dL — AB (ref 8.9–10.3)
CHLORIDE: 99 mmol/L — AB (ref 101–111)
CO2: 30 mmol/L (ref 22–32)
CO2: 29 mmol/L (ref 22–32)
CREATININE: 0.5 mg/dL (ref 0.44–1.00)
Calcium: 8.5 mg/dL — ABNORMAL LOW (ref 8.9–10.3)
Chloride: 99 mmol/L — ABNORMAL LOW (ref 101–111)
Creatinine, Ser: 0.61 mg/dL (ref 0.44–1.00)
GFR calc Af Amer: >60 mL/min (ref >60)
GFR calc Af Amer: >60 mL/min (ref >60)
GFR calc non Af Amer: >60 mL/min (ref >60)
GFR calc non Af Amer: >60 mL/min (ref >60)
Glucose, Bld: 192 mg/dL — ABNORMAL HIGH (ref 65–99)
Glucose, Bld: 300 mg/dL — ABNORMAL HIGH (ref 65–99)
Potassium: 4.4 mmol/L (ref 3.5–5.1)
Potassium: 4.2 mmol/L (ref 3.5–5.1)
SODIUM: 136 mmol/L (ref 135–145)
Sodium: 135 mmol/L (ref 135–145)

## 2017-11-29 LAB — PHOSPHORUS: Phosphorus: 3.7 mg/dL (ref 2.5–4.6)

## 2017-11-29 LAB — PREPARE RBC (CROSSMATCH)

## 2017-11-29 LAB — TYPE AND SCREEN
ABO/RH(D): O POS
ANTIBODY SCREEN: NEGATIVE

## 2017-11-29 MED ORDER — SODIUM CHLORIDE 0.9 % IV SOLN: Freq: Once | INTRAVENOUS | Status: DC

## 2017-11-29 MED ORDER — TRACE MINERALS CR-CU-MN-SE-ZN 10-1000-500-60 MCG/ML IV SOLN
INTRAVENOUS | Status: AC
  Administered 2017-11-29: 17:00:00 via INTRAVENOUS
  Filled 2017-11-29: qty 907.2

## 2017-11-29 MED ORDER — MORPHINE SULFATE (PF) 2 MG/ML IV SOLN
0.5000 mg | INTRAVENOUS | Status: DC | PRN
Start: 2017-11-29 — End: 2017-12-02

## 2017-11-29 NOTE — Progress Notes (Signed)
PROGRESS NOTE    Anna Cuevas  LPF:[id number] DOB: 08-Oct-1940 DOA: 11/21/2017 PCP: Kathyrn Lass, MD   Brief Narrative: Patient is a 77 year old female with past medical history of A. fib on Eliquis, hypertension, diabetes, GERD, depression who presented with hematemesis .She was recently admitted here 5 weeks ago and found to have new onset A. Fib  and was discharged on Eliquis.  Patient was having nausea, vomiting at home .Vomitus was dark-colored.  Patient underwent EGD on 5/6 and was found to have severe stenosis at  duodenal bulb.CT scan done here showed gallbladder mass, hepatic metastasis, severe distention of the stomach with gastric outlet obstruction. She underwent liver biopsy on 11/24/2017.  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  Oncology consulted .  No plan for chemotherapy but she may be a candidate for palliative radiation. IR consulted  for percutaneous G tube placement for palliation.Plan is for Monday.  Assessment & Plan:   Principal Problem:   Gastric outlet obstruction with GB tumor Active Problems:   Atrial fibrillation with rapid ventricular response (HCC)   N&V (nausea and vomiting)   Diabetes mellitus without complication (HCC)   Pressure injury of skin   GIB (gastrointestinal bleeding)   Depression   Essential hypertension   GERD (gastroesophageal reflux disease)   Gallbladder carcinoma (HCC)   Blood loss anemia   Goals of care, counseling/discussion   Palliative care by specialist  Gastric outlet obstruction with gallbladder tumor/liver mets:  Plan for percutaneous G-tube placement for palliation on Monday.She is not a candidate for surgery. Oncology consulted .  Plan is to involve radiation oncology.  No role of chemotherapy at present.  Hospice also consulted by oncology. Patient unable to take by mouth.  On NG tube. On TPN.  Underwent liver biopsy by IR on  11/24/2017. Pathology from endoscopy and liver biopsy  confirmed adenocarcinoma most likely from the hepatobiliary source likely gallbladder. Gastric outlet obstruction  secondary to gall bladder mass with invasion of liver and duodenum. Needs surgery vs stenting.  Continue NG tube for decompression.  Upper GI series shows complete gastric outlet obstruction so  stenting not possible as per GI. Elevated CEA and CA19-9 CT abdomen showed:Heterogeneous, ill-defined gallbladder invading the adjacent liver, consistent with gallbladder carcinoma. Several hepatic metastases measuring up to 2.0 cm,severe distention of the stomach with gastric outlet obstruction secondary to focal narrowing and invasion of the first portion of the duodenum by the gallbladder mass.  Hematemesis/acute upper GI bleed: Bleeding has stopped.  Will avoid blood thinners.Eliquis stopped.  Acute blood loss anemia: Status post 3 units of PRBC.  Hemoglobin in  the range of 7.  We will continue to monitor H&H.  We will transfuse her with 2 units of PRBC today.  Nutrition: Gastric stricture  is very tight as per EGD.  Oral intake is not possible at this time.   Hypokalemia/hypomagnesemia: Supplemented.  We will continue to monitor the levels  A. fib: Currently on normal sinus rhythm.  Rate controlled.  Not on anticoagulation due to hematemesis .  DM: We will continue to monitor blood sugars.  Hypertension: Currently blood pressure stable.  We will continue to monitor.    DVT prophylaxis: SCD Code Status: Full code Family Communication: Discussed with daughter on phone on 11/27/17 Disposition Plan:    Awaiting  management of gastric outlet obstruction.Plan for gastrostomy tube on Monday. long-term plan on nutrition.   Consultants: General surgery, GI  Procedures:  Liver biopsy on 11/24/2017  Antimicrobials: None   Subjective:  Patient seen and examined at the bedside this morning.  Looks comfortable.  Denies any abdominal  pain.  Complaint of irritation by the NG tube.  Objective: Vitals:   11/29/17 0013 11/29/17 0410 11/29/17 0808 11/29/17 0845  BP: (!) 152/61 (!) 157/75 (!) 145/81 (!) 158/49  Pulse: 84 80 87 (!) 154  Resp: (!) 25 18 16  (!) 24  Temp: 98.7 F (37.1 C) 98.6 F (37 C) 98.3 F (36.8 C) 97.8 F (36.6 C)  TempSrc: Oral Oral Oral   SpO2: 98% 96% 98% 97%  Weight:      Height:        Intake/Output Summary (Last 24 hours) at 11/29/2017 1128 Last data filed at 11/29/2017 0830 Gross per 24 hour  Intake 1667.16 ml  Output 1125 ml  Net 542.16 ml   Filed Weights   11/22/17 1200 11/23/17 1111 11/27/17 0532  Weight: 81.4 kg (179 lb 7.3 oz) 81.2 kg (179 lb) 81.4 kg (179 lb 7.3 oz)    Examination:  General exam: Appears calm and comfortable ,Not in distress,average built HEENT:PERRL,Oral mucosa moist, Ear/Nose normal on gross exam Respiratory system: Bilateral equal air entry, normal vesicular breath sounds, no wheezes or crackles  Cardiovascular system: S1 & S2 heard, RRR. No JVD, murmurs, rubs, gallops or clicks. Gastrointestinal system: Abdomen is nondistended, soft and nontender. No organomegaly or masses felt. Bowel sounds slow.  NG tube in place. Central nervous system: Alert and oriented. No focal neurological deficits. Extremities: No edema, no clubbing ,no cyanosis, distal peripheral pulses palpable. Skin: No rashes, lesions or ulcers,no icterus ,no pallor   Data Reviewed: I have personally reviewed following labs and imaging studies  CBC: Recent Labs  Lab 11/24/17 0304 11/25/17 0327 11/26/17 0952 11/28/17 0410 11/29/17 0429  WBC 7.5 7.6 8.3 6.0 6.4  NEUTROABS  --  5.9 6.8 4.3 4.6  HGB 7.8* 7.8* 8.2* 7.6* 7.7*  HCT 24.3* 24.2* 25.6* 23.9* 24.9*  MCV 82.1 80.9 80.3 82.7 81.6  PLT 252 253 296 233 542   Basic Metabolic Panel: Recent Labs  Lab 11/25/17 0327 11/26/17 0524 11/27/17 0738 11/28/17 0410 11/28/17 0513 11/29/17 0429  NA 136 136 135 134* 136 136  K 3.0*  3.5 4.0 6.1* 3.9 4.4  CL 102 99* 99* 100* 99* 99*  CO2 23 25 29 29 30 30   GLUCOSE 91 184* 211* 533* 182* 192*  BUN 7 5* 9 11 11 16   CREATININE 0.74 0.66 0.46 0.52 0.48 0.50  CALCIUM 7.9* 8.1* 8.2* 8.2* 8.2* 8.4*  MG 1.1* 1.6* 1.7 2.3  --  1.8  PHOS 2.8 2.8 3.1 5.4*  --  3.7   GFR: Estimated Creatinine Clearance: 59.5 mL/min (by C-G formula based on SCr of 0.5 mg/dL). Liver Function Tests: Recent Labs  Lab 11/24/17 0304 11/25/17 0327 11/26/17 0524  AST 15 17 16   ALT 14 15 15   ALKPHOS 102 112 116  BILITOT 0.7 1.0 0.8  PROT 4.6* 4.8* 5.2*  ALBUMIN 2.4* 2.5* 2.5*   No results for input(s): LIPASE, AMYLASE in the last 168 hours. No results for input(s): AMMONIA in the last 168 hours. Coagulation Profile: Recent Labs  Lab 11/24/17 0304  INR 1.15   Cardiac Enzymes: No results for input(s): CKTOTAL, CKMB, CKMBINDEX, TROPONINI in the last 168 hours. BNP (last 3 results) No results for input(s): PROBNP in the last 8760 hours. HbA1C: No results for input(s): HGBA1C in the last 72 hours. CBG: Recent  Labs  Lab 11/28/17 1638 11/28/17 2027 11/29/17 0016 11/29/17 0410 11/29/17 0807  GLUCAP 195* 201* 179* 241* 183*   Lipid Profile: No results for input(s): CHOL, HDL, LDLCALC, TRIG, CHOLHDL, LDLDIRECT in the last 72 hours. Thyroid Function Tests: No results for input(s): TSH, T4TOTAL, FREET4, T3FREE, THYROIDAB in the last 72 hours. Anemia Panel: No results for input(s): VITAMINB12, FOLATE, FERRITIN, TIBC, IRON, RETICCTPCT in the last 72 hours. Sepsis Labs: No results for input(s): PROCALCITON, LATICACIDVEN in the last 168 hours.  Recent Results (from the past 240 hour(s))  Culture, blood (Routine X 2) w Reflex to ID Panel     Status: None   Collection Time: 11/22/17  6:05 AM  Result Value Ref Range Status   Specimen Description BLOOD LEFT HAND  Final   Special Requests   Final    BOTTLES DRAWN AEROBIC AND ANAEROBIC Blood Culture adequate volume   Culture   Final    NO  GROWTH 5 DAYS Performed at [facility], [address], [place], [place] [address]    Report Status 11/27/2017 FINAL  Final  Culture, blood (Routine X 2) w Reflex to ID Panel     Status: None   Collection Time: 11/22/17  6:13 AM  Result Value Ref Range Status   Specimen Description BLOOD LEFT ANTECUBITAL  Final   Special Requests   Final    BOTTLES DRAWN AEROBIC AND ANAEROBIC Blood Culture adequate volume   Culture   Final    NO GROWTH 5 DAYS Performed at [facility], [place] [address] [place], [place] [address]    Report Status 11/27/2017 FINAL  Final  MRSA PCR Screening     Status: None   Collection Time: 11/22/17  1:44 PM  Result Value Ref Range Status   MRSA by PCR NEGATIVE NEGATIVE Final    Comment:        The GeneXpert MRSA Assay (FDA approved for NASAL specimens only), is one component of a comprehensive MRSA colonization surveillance program. It is not intended to diagnose MRSA infection nor to guide or monitor treatment for MRSA infections. Performed at [facility], [place] [address], [place],  [address]          Radiology Studies: No results found.      Scheduled Meds: . heparin injection (subcutaneous)  5,000 Units Subcutaneous Q8H  . insulin aspart  0-15 Units Subcutaneous Q4H  . mouth rinse  15 mL Mouth Rinse BID  . metoprolol tartrate  5 mg Intravenous Q6H   Continuous Infusions: . sodium chloride Stopped (11/29/17 0830)  . sodium chloride    . TPN ADULT (ION) 70 mL/hr at 11/29/17 0830  . TPN ADULT (ION)       LOS: 8 days    Time spent: 25 mins.More than 50% of that time was spent in counseling and/or coordination of care.      Shelly Coss, MD [facility] Pager [phone]  If 7PM-7AM, please contact night-coverage [email or web address] Password [facility] 11/29/2017, 11:28 AM

## 2017-11-29 NOTE — Progress Notes (Signed)
Palliative medicine progress note  Met with patient, patient's daughter and son-in-law; chart reviewed.  Family has elected to continue TPN for now.  We did discuss CODE STATUS, and patient has now elected DNR.  Hardcopy is on the chart for transportation.  Educated family on how to access hospice when goals change.  Family overall wants to get her home as soon as possible.  At this point patient is probably too ill to make outpatient physician appointments.  She will not be pursuing chemotherapy or radiation treatment and thus oncology likely is not going to be their ongoing community follow-up.  Recommended doctors that make house calls.  Family concerned that they will not be able to get an appointment with this group in a timely enough fashion to continue TPN and to reestablish for medication management in the home.  We will see if our team can assist with a referral to this medical practice.  Anticipate this family transitioning to hospice in the very near future but for now is wishing to continue TPN.  She is going for a venting PEG on 11/30/2017.  Total time: 25 minutes Romona Curls, ANP

## 2017-11-29 NOTE — Progress Notes (Signed)
Notified NP that patient dumped 2500 ml from NGT dark brown. Patient feels relief of abdominal pressure. NGT seemed to have been positional when adjusted with movement large amount of drainage. Labs collected per order to check for changes. I will continue to monitor.

## 2017-11-29 NOTE — Progress Notes (Signed)
PHARMACY - ADULT TOTAL PARENTERAL NUTRITION CONSULT NOTE   Pharmacy Consult:  TPN Indication:  GOO  Patient Measurements: Height: 5\' 3"  (160 cm) Weight: 179 lb 7.3 oz (81.4 kg) IBW/kg (Calculated) : 52.4 TPN AdjBW (KG): 59.6 Body mass index is 31.79 kg/m.  Assessment:  31 YOF presented on 11/21/17 with hematemesis.  Patient was hospitalized 5 weeks ago d/t N/V and continued to have those symptoms post discharge.  CT concerning for GOO and gallbladder carcinoma with hepatic mets as well as choledocholithiasis.  She is s/p EGD with duodenal biopsy on 5/6 and liver biopsy on 11/24/17.  Given expected prolonged NPO status and while work-up is in progress, pharmacy consulted to initiate TPN.  Patient reported that she gained weight instead of losing weight, and was eating about 50% of her meals for 2 weeks PTA. She is at risk for refeeding syndrome.  GI: baseline prealbumin low at 10, NG O/P 100 mL (brown) - Protonix gtt. Gastric stricture is very tight per EGD and patient refusing repeat EGD. Venting G-tube placement on Monday for palliation. Endo: DM on metformin PTA - CBGs 180-240s Insulin requirements in the past 24 hours: 20 unit SSI (13 units since new TPN hung) + 15 units of regular insulin in TPN Lytes: K 4.4, Phos 3.7, and Mag 1.8, CoCa 9.4, Cl low, others low normal Renal: SCr 0.5 stable, BUN WNL - UOP 0.6 ml/kg/hr, NS at 20 ml/hr (net + 8.7L) Pulm: stable on RA Cards: HTN - BP elevated, HR mostly controlled (intermitttent SVT noted)- IV metoprolol, PRN hydralazine AC: Eliquis PTA for Afib dx in March, s/p KCentra on 5/4 - hgb low-stable at 7.7, plts WNL Hepatobil: LFTs / tbili / TG WNL Neuro: A&O - PRN APAP ID: afebrile, WBC WNL - not on abx TPN Access: PICC to be placed 5/8 TPN start date: 11/25/17  Nutritional Goals (RD rec pending): 1600-1700 kCal and 85-100 gm protein per day, fluid >2L   Current Nutrition:  NPO  Plan:  Continue TPN at 70 ml/hr (goal rate 70)  TPN will  provide 90.72g AA, 269g CHO and 38.7g ILE for a total of 1664 kCal, meeting 100% of patient's needs. Electrolytes in TPN: continue increased Na/K/Mg, slightly reduced Phos, standard calcium, maximize Cl Daily multivitamin and trace elements in TPN Continue moderate SSI Q4H Increase to 25 units of regular insulin in TPN F/U AM labs   Sloan Leiter, PharmD, BCPS, BCCCP Clinical Pharmacist Clinical phone 11/29/2017 until 3:30PM - #[phone] After hours, please call [phone] 11/29/2017, 7:25 AM

## 2017-11-29 NOTE — Consult Note (Signed)
Delayed Entry: The patient has an adenocarcinoma, cholangiocarcinoma, and has been hospitalized due to gastric outlet obstruction from her tumor. She has been decompressed with NGT to [facility] and has done better symptomatically, however does not have performance status to tolerate chemotherapy. Her case was discussed in multidisciplinary GI conference. Although palliative radiotherapy is an option for her to help alleviate stenosis, the patient is not willing to consider this due to logistics of daily therapy. There is also concern that her symptoms could still persist and delay her ultimate desire to return home to be with her family. In meeting with the patient, and discussing her case on two occasions with her daughter, they have decided to forgo radiotherapy, proceed with G tube and hospice at home. We will be available to discuss her options if her goals of care change.      Anna Cuevas, PAC

## 2017-11-30 ENCOUNTER — Inpatient Hospital Stay (HOSPITAL_COMMUNITY): Payer: PPO

## 2017-11-30 ENCOUNTER — Encounter (HOSPITAL_COMMUNITY): Payer: Self-pay | Admitting: Interventional Radiology

## 2017-11-30 HISTORY — PX: IR GASTROSTOMY TUBE MOD SED: IMG625

## 2017-11-30 LAB — CBC
HEMATOCRIT: 30.5 % — AB (ref 36.0–46.0)
Hemoglobin: 9.6 g/dL — ABNORMAL LOW (ref 12.0–15.0)
MCH: 25.6 pg — ABNORMAL LOW (ref 26.0–34.0)
MCHC: 31.5 g/dL (ref 30.0–36.0)
MCV: 81.3 fL (ref 78.0–100.0)
PLATELETS: 268 10*3/uL (ref 150–400)
RBC: 3.75 MIL/uL — ABNORMAL LOW (ref 3.87–5.11)
RDW: 14.8 % (ref 11.5–15.5)
WBC: 7.9 10*3/uL (ref 4.0–10.5)

## 2017-11-30 LAB — TYPE AND SCREEN
ABO/RH(D): O POS
ANTIBODY SCREEN: NEGATIVE
UNIT DIVISION: 0
Unit division: 0

## 2017-11-30 LAB — GLUCOSE, CAPILLARY
GLUCOSE-CAPILLARY: 194 mg/dL — AB (ref 65–99)
GLUCOSE-CAPILLARY: 178 mg/dL — AB (ref 65–99)
GLUCOSE-CAPILLARY: 187 mg/dL — AB (ref 65–99)
Glucose-Capillary: 169 mg/dL — ABNORMAL HIGH (ref 65–99)
Glucose-Capillary: 191 mg/dL — ABNORMAL HIGH (ref 65–99)
Glucose-Capillary: 193 mg/dL — ABNORMAL HIGH (ref 65–99)
Glucose-Capillary: 221 mg/dL — ABNORMAL HIGH (ref 65–99)

## 2017-11-30 LAB — TRIGLYCERIDES: TRIGLYCERIDES: 115 mg/dL (ref <150)

## 2017-11-30 LAB — DIFFERENTIAL
BASOS ABS: 0 10*3/uL (ref 0.0–0.1)
BASOS PCT: 0 %
EOS ABS: 0.2 10*3/uL (ref 0.0–0.7)
Eosinophils Relative: 3 %
Lymphocytes Relative: 14 %
Lymphs Abs: 1.1 10*3/uL (ref 0.7–4.0)
MONOS PCT: 8 %
Monocytes Absolute: 0.7 10*3/uL (ref 0.1–1.0)
Neutro Abs: 5.9 10*3/uL (ref 1.7–7.7)
Neutrophils Relative %: 75 %

## 2017-11-30 LAB — BPAM RBC
BLOOD PRODUCT EXPIRATION DATE: 201906062359
BLOOD PRODUCT EXPIRATION DATE: 201906062359
ISSUE DATE / TIME: 201905120810
ISSUE DATE / TIME: 201905121352
UNIT TYPE AND RH: 5100
Unit Type and Rh: 5100

## 2017-11-30 LAB — COMPREHENSIVE METABOLIC PANEL
ALT: 16 U/L (ref 14–54)
ANION GAP: 7 (ref 5–15)
AST: 17 U/L (ref 15–41)
Albumin: 2.3 g/dL — ABNORMAL LOW (ref 3.5–5.0)
Alkaline Phosphatase: 123 U/L (ref 38–126)
BILIRUBIN TOTAL: 0.4 mg/dL (ref 0.3–1.2)
BUN: 27 mg/dL — ABNORMAL HIGH (ref 6–20)
CALCIUM: 8.4 mg/dL — AB (ref 8.9–10.3)
CO2: 29 mmol/L (ref 22–32)
CREATININE: 0.48 mg/dL (ref 0.44–1.00)
Chloride: 102 mmol/L (ref 101–111)
GFR calc non Af Amer: >60 mL/min (ref >60)
Glucose, Bld: 187 mg/dL — ABNORMAL HIGH (ref 65–99)
Potassium: 4.3 mmol/L (ref 3.5–5.1)
Sodium: 138 mmol/L (ref 135–145)
TOTAL PROTEIN: 5.3 g/dL — AB (ref 6.5–8.1)

## 2017-11-30 LAB — PROTIME-INR
INR: 1.05
PROTHROMBIN TIME: 13.6 s (ref 11.4–15.2)

## 2017-11-30 LAB — PHOSPHORUS: PHOSPHORUS: 3.7 mg/dL (ref 2.5–4.6)

## 2017-11-30 LAB — MAGNESIUM: Magnesium: 2 mg/dL (ref 1.7–2.4)

## 2017-11-30 LAB — PREALBUMIN: Prealbumin: 9.9 mg/dL — ABNORMAL LOW (ref 18–38)

## 2017-11-30 MED ORDER — LIDOCAINE HCL (PF) 1 % IJ SOLN
INTRAMUSCULAR | Status: AC | PRN
  Administered 2017-11-30: 10 mL

## 2017-11-30 MED ORDER — IOPAMIDOL (ISOVUE-300) INJECTION 61%
INTRAVENOUS | Status: AC
  Administered 2017-11-30: 20 mL
  Filled 2017-11-30: qty 50

## 2017-11-30 MED ORDER — TRACE MINERALS CR-CU-MN-SE-ZN 10-1000-500-60 MCG/ML IV SOLN
INTRAVENOUS | Status: AC
  Administered 2017-11-30: 18:00:00 via INTRAVENOUS
  Filled 2017-11-30: qty 996

## 2017-11-30 MED ORDER — MIDAZOLAM HCL 2 MG/2ML IJ SOLN
INTRAMUSCULAR | Status: AC
  Filled 2017-11-30: qty 2

## 2017-11-30 MED ORDER — LIDOCAINE HCL 1 % IJ SOLN
INTRAMUSCULAR | Status: AC
Start: 2017-11-30 — End: 2017-12-01
  Filled 2017-11-30: qty 20

## 2017-11-30 MED ORDER — MIDAZOLAM HCL 2 MG/2ML IJ SOLN
INTRAMUSCULAR | Status: AC | PRN
  Administered 2017-11-30: 0.5 mg via INTRAVENOUS
  Administered 2017-11-30: 1 mg via INTRAVENOUS

## 2017-11-30 MED ORDER — CEFAZOLIN SODIUM-DEXTROSE 2-4 GM/100ML-% IV SOLN
INTRAVENOUS | Status: AC
  Administered 2017-11-30: 2000 mg
  Filled 2017-11-30: qty 100

## 2017-11-30 MED ORDER — FENTANYL CITRATE (PF) 100 MCG/2ML IJ SOLN
INTRAMUSCULAR | Status: AC | PRN
  Administered 2017-11-30 (×2): 25 ug via INTRAVENOUS

## 2017-11-30 MED ORDER — FENTANYL CITRATE (PF) 100 MCG/2ML IJ SOLN
INTRAMUSCULAR | Status: AC
  Filled 2017-11-30: qty 2

## 2017-11-30 NOTE — Procedures (Signed)
Interventional Radiology Procedure Note  Procedure: Gastrostomy tube placement  Complications: None  Estimated Blood Loss: None  Findings: 20 Fr bumper retention gastrostomy tube placed with tip in body of stomach.  After placement, 700 mL of fluid and debris removed from stomach by attaching gastrostomy tube to wall suction.  Anna Cuevas. Kathlene Cote, M.D Pager:  [phone]

## 2017-11-30 NOTE — Progress Notes (Signed)
PROGRESS NOTE    PATTON SWISHER  WEX:[id number] DOB: [birth year] DOA: 11/21/2017 PCP: Kathyrn Lass, MD   Brief Narrative: Patient is a 77 year old female with past medical history of A. fib on Eliquis, hypertension, diabetes, GERD, depression who presented with hematemesis .She was recently admitted here 5 weeks ago and found to have new onset A. Fib  and was discharged on Eliquis.  Patient was having nausea, vomiting at home .Vomitus was dark-colored.  Patient underwent EGD on 5/6 and was found to have severe stenosis at  duodenal bulb.CT scan done here showed gallbladder mass, hepatic metastasis, severe distention of the stomach with gastric outlet obstruction. She underwent liver biopsy on 11/24/2017.  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  Oncology consulted .  No plan for chemotherapy but she may be a candidate for palliative radiation. IR consulted  for percutaneous G tube placement for palliation.  She is undergoing percutaneous G-tube placement today.  Assessment & Plan:   Principal Problem:   Gastric outlet obstruction with GB tumor Active Problems:   Atrial fibrillation with rapid ventricular response (HCC)   N&V (nausea and vomiting)   Diabetes mellitus without complication (HCC)   Pressure injury of skin   GIB (gastrointestinal bleeding)   Depression   Essential hypertension   GERD (gastroesophageal reflux disease)   Gallbladder carcinoma (HCC)   Blood loss anemia   Goals of care, counseling/discussion   Palliative care by specialist  Gastric outlet obstruction with gallbladder tumor/liver mets:  Plan for percutaneous G-tube placement for palliation today.She is not a candidate for surgery. Oncology was  consulted .  Plan was to involve radiation oncology. Will follow up. No role of chemotherapy at present.  Hospice also consulted by oncology. Patient unable to take by mouth.  On  NG tube. On TPN.  Underwent liver biopsy by IR on 11/24/2017. Pathology from endoscopy and liver biopsy  confirmed adenocarcinoma most likely from the hepatobiliary source likely gallbladder. Gastric outlet obstruction  secondary to gall bladder mass with invasion of liver and duodenum. Continue NG tube for decompression.  Upper GI series shows complete gastric outlet obstruction so  stenting not possible as per GI. Elevated CEA and CA19-9 CT abdomen showed:Heterogeneous, ill-defined gallbladder invading the adjacent liver, consistent with gallbladder carcinoma. Several hepatic metastases measuring up to 2.0 cm,severe distention of the stomach with gastric outlet obstruction secondary to focal narrowing and invasion of the first portion of the duodenum by the gallbladder mass. Plan is to discharge her home with hospice.  Hematemesis/acute upper GI bleed: Bleeding has stopped.  Will avoid blood thinners.Eliquis stopped.Patient denied repeat EGD by GI.  Acute blood loss anemia: Status post 5 units of PRBC in total..  Hemoglobin in  the range of 9 today.  We will continue to monitor H&H. Transfused her with 2 units of PRBC yesterday.  Nutrition: Gastric stricture  is very tight as per EGD.  Oral intake is not possible at this time.   Hypokalemia/hypomagnesemia: Supplemented.  We will continue to monitor the levels  A. fib: Currently on normal sinus rhythm.  Rate controlled.  Not on anticoagulation due to hematemesis .  DM: We will continue to monitor blood sugars.  Hypertension: Currently blood pressure stable.  We will continue to monitor.    DVT prophylaxis: SCD Code Status: Full code Family Communication: Discussed with daughter on phone on 11/27/17 Disposition Plan:    Awaiting  management of gastric outlet obstruction.Plan for  gastrostomy tube today.  Likely discharge to home with hospice in 1 to 2 days.   Consultants: General surgery, GI,Oncology  Procedures: Liver biopsy on  11/24/2017,EGD  Antimicrobials: None   Subjective:  Patient seen and examined at the bedside this morning.  Denies any complaints.  No abdominal pain.  Has not had a bowel movement. Objective: Vitals:   11/30/17 0000 11/30/17 0200 11/30/17 0400 11/30/17 0714  BP: (!) 152/61 126/60 (!) 127/57 (!) 130/54  Pulse:   72   Resp: 19 20 18  (!) 23  Temp: 98 F (36.7 C)   98.3 F (36.8 C)  TempSrc: Oral   Oral  SpO2:   98% 97%  Weight:      Height:        Intake/Output Summary (Last 24 hours) at 11/30/2017 0908 Last data filed at 11/30/2017 0700 Gross per 24 hour  Intake 1963.84 ml  Output 2900 ml  Net -936.16 ml   Filed Weights   11/22/17 1200 11/23/17 1111 11/27/17 0532  Weight: 81.4 kg (179 lb 7.3 oz) 81.2 kg (179 lb) 81.4 kg (179 lb 7.3 oz)    Examination:  General exam: Appears calm and comfortable ,Not in distress,average built HEENT:PERRL,Oral mucosa moist, Ear/Nose normal on gross exam Respiratory system: Bilateral equal air entry, normal vesicular breath sounds, no wheezes or crackles  Cardiovascular system: S1 & S2 heard, RRR. No JVD, murmurs, rubs, gallops or clicks. Gastrointestinal system: Abdomen is nondistended, soft and nontender. No organomegaly or masses felt. Slow bowel sounds  Central nervous system: Alert and oriented. No focal neurological deficits. Extremities: No edema, no clubbing ,no cyanosis, distal peripheral pulses palpable. Skin: No rashes, lesions or ulcers,no icterus ,no pallor Psychiatry: Judgement and insight appear normal. Mood & affect appropriate.     Data Reviewed: I have personally reviewed following labs and imaging studies  CBC: Recent Labs  Lab 11/26/17 0952 11/28/17 0410 11/29/17 0429 11/29/17 1817 11/29/17 2230 11/30/17 0632  WBC 8.3 6.0 6.4 10.7* 10.2 7.9  NEUTROABS 6.8 4.3 4.6 8.0*  --  5.9  HGB 8.2* 7.6* 7.7* 11.1* 9.9* 9.6*  HCT 25.6* 23.9* 24.9* 35.0* 31.3* 30.5*  MCV 80.3 82.7 81.6 81.0 81.3 81.3  PLT 296 233 252 340  269 440   Basic Metabolic Panel: Recent Labs  Lab 11/26/17 0524 11/27/17 0738 11/28/17 0410 11/28/17 0513 11/29/17 0429 11/29/17 2230 11/30/17 0632  NA 136 135 134* 136 136 135 138  K 3.5 4.0 6.1* 3.9 4.4 4.2 4.3  CL 99* 99* 100* 99* 99* 99* 102  CO2 25 29 29 30 30 29 29   GLUCOSE 184* 211* 533* 182* 192* 300* 187*  BUN 5* 9 11 11 16  25* 27*  CREATININE 0.66 0.46 0.52 0.48 0.50 0.61 0.48  CALCIUM 8.1* 8.2* 8.2* 8.2* 8.4* 8.5* 8.4*  MG 1.6* 1.7 2.3  --  1.8 2.0 2.0  PHOS 2.8 3.1 5.4*  --  3.7  --  3.7   GFR: Estimated Creatinine Clearance: 59.5 mL/min (by C-G formula based on SCr of 0.48 mg/dL). Liver Function Tests: Recent Labs  Lab 11/24/17 0304 11/25/17 0327 11/26/17 0524 11/30/17 0632  AST 15 17 16 17   ALT 14 15 15 16   ALKPHOS 102 112 116 123  BILITOT 0.7 1.0 0.8 0.4  PROT 4.6* 4.8* 5.2* 5.3*  ALBUMIN 2.4* 2.5* 2.5* 2.3*   No results for input(s): LIPASE, AMYLASE in the last 168 hours. No results for input(s): AMMONIA in the last 168 hours. Coagulation Profile: Recent Labs  Lab 11/24/17 0304 11/30/17 0632  INR 1.15 1.05   Cardiac Enzymes: No results for input(s): CKTOTAL, CKMB, CKMBINDEX, TROPONINI in the last 168 hours. BNP (last 3 results) No results for input(s): PROBNP in the last 8760 hours. HbA1C: No results for input(s): HGBA1C in the last 72 hours. CBG: Recent Labs  Lab 11/29/17 1640 11/29/17 1953 11/30/17 0115 11/30/17 0438 11/30/17 0743  GLUCAP 211* 268* 221* 194* 178*   Lipid Profile: No results for input(s): CHOL, HDL, LDLCALC, TRIG, CHOLHDL, LDLDIRECT in the last 72 hours. Thyroid Function Tests: No results for input(s): TSH, T4TOTAL, FREET4, T3FREE, THYROIDAB in the last 72 hours. Anemia Panel: No results for input(s): VITAMINB12, FOLATE, FERRITIN, TIBC, IRON, RETICCTPCT in the last 72 hours. Sepsis Labs: No results for input(s): PROCALCITON, LATICACIDVEN in the last 168 hours.  Recent Results (from the past 240 hour(s))    Culture, blood (Routine X 2) w Reflex to ID Panel     Status: None   Collection Time: 11/22/17  6:05 AM  Result Value Ref Range Status   Specimen Description BLOOD LEFT HAND  Final   Special Requests   Final    BOTTLES DRAWN AEROBIC AND ANAEROBIC Blood Culture adequate volume   Culture   Final    NO GROWTH 5 DAYS Performed at [facility], [address] [place], [place] [address]    Report Status 11/27/2017 FINAL  Final  Culture, blood (Routine X 2) w Reflex to ID Panel     Status: None   Collection Time: 11/22/17  6:13 AM  Result Value Ref Range Status   Specimen Description BLOOD LEFT ANTECUBITAL  Final   Special Requests   Final    BOTTLES DRAWN AEROBIC AND ANAEROBIC Blood Culture adequate volume   Culture   Final    NO GROWTH 5 DAYS Performed at [facility], [place] [address] [place], [place] [address]    Report Status 11/27/2017 FINAL  Final  MRSA PCR Screening     Status: None   Collection Time: 11/22/17  1:44 PM  Result Value Ref Range Status   MRSA by PCR NEGATIVE NEGATIVE Final    Comment:        The GeneXpert MRSA Assay (FDA approved for NASAL specimens only), is one component of a comprehensive MRSA colonization surveillance program. It is not intended to diagnose MRSA infection nor to guide or monitor treatment for MRSA infections. Performed at [facility], [place] [address] [place], [place] [address]          Radiology Studies: No results found.      Scheduled Meds: . insulin aspart  0-15 Units Subcutaneous Q4H  . mouth rinse  15 mL Mouth Rinse BID  . metoprolol tartrate  5 mg Intravenous Q6H   Continuous Infusions: . sodium chloride 20 mL/hr at 11/29/17 1600  . sodium chloride    . TPN ADULT (ION) 70 mL/hr at 11/30/17 0000  . TPN ADULT (ION)       LOS: 9 days    Time spent: 25 mins.More than 50% of that time was spent in counseling and/or coordination of care.      Shelly Coss, MD [facility] Pager  [phone]  If 7PM-7AM, please contact night-coverage [email or web address] Password [email or web address] 11/30/2017, 9:08 AM

## 2017-11-30 NOTE — Progress Notes (Signed)
PHARMACY - ADULT TOTAL PARENTERAL NUTRITION CONSULT NOTE   Pharmacy Consult:  TPN Indication:  GOO  Patient Measurements: Height: 5\' 3"  (160 cm) Weight: 179 lb 7.3 oz (81.4 kg) IBW/kg (Calculated) : 52.4 TPN AdjBW (KG): 59.6 Body mass index is 31.79 kg/m.  Assessment:  54 YOF presented on 11/21/17 with hematemesis.  Patient was hospitalized 5 weeks ago d/t N/V and continued to have those symptoms post discharge.  CT concerning for GOO and gallbladder carcinoma with hepatic mets as well as choledocholithiasis.  She is s/p EGD with duodenal biopsy on 5/6 and liver biopsy on 11/24/17.  Given expected prolonged NPO status and while work-up is in progress, pharmacy consulted to initiate TPN.  Patient reported that she gained weight instead of losing weight, and was eating about 50% of her meals for 2 weeks PTA. She is at risk for refeeding syndrome.  GI: baseline prealbumin low at 10, NG O/P 2500 mL (dark brown) - Protonix gtt. Gastric stricture is very tight per EGD and patient refusing repeat EGD. Venting G-tube placement on Monday for palliation. Prealb 9.9 Endo: DM on metformin PTA - CBGs 183-268 Insulin requirements in the past 24 hours: 22 unit SSI + 15 units of regular insulin in TPN Lytes: K 4.3, Phos 3.7, and Mag 2, CoCa 9.6, others normal Renal: SCr 0.48 stable, BUN 27 - UOP 0.2 ml/kg/hr, NS at 20 ml/hr (net + 8L) Pulm: stable on RA Cards: HTN - BP normal, HR mostly controlled (intermitttent SVT noted)- IV metoprolol, PRN hydralazine AC: Eliquis PTA for Afib dx in March, s/p KCentra on 5/4 - Hgb 9.6 (receiving 2 units PRBC), plts WNL Hepatobil: LFTs / tbili / TG WNL Neuro: A&O - PRN APAP ID: afebrile, WBC WNL - not on abx TPN Access: PICC placed 5/8 TPN start date: 11/25/17  Nutritional Goals (RD rec 11/27/17): 1750-1950 kCal and 90-105 gm protein per day, fluid >1.7L   Current Nutrition:  NPO  Plan:  Updated TPN per RD rec on 11/27/17 (goal rate 83 ml/hr) Increase TPN to 83  ml/hr TPN will provide 100g AA, 292g CHO and 46g ILE for a total of 1800 kCal, meeting 100% of patient's needs. Electrolytes in TPN: continue increased Na/K/Mg, slightly reduced Phos, standard calcium, maximize Cl Daily multivitamin and trace elements in TPN Continue moderate SSI Q4H Increase to 30 units of regular insulin in TPN F/U TPN labs   Renold Genta, PharmD, BCPS Clinical Pharmacist Clinical phone for 11/30/2017 until 3p is [phone] After 3p, please call Main Rx at [phone] for assistance 11/30/2017 7:40 AM

## 2017-11-30 NOTE — Sedation Documentation (Signed)
Pt is agitated from insertion of OG

## 2017-11-30 NOTE — Progress Notes (Signed)
Palliative Medicine RN Note: Rec'd request from weekend PMT NP to assist with setting up home provider visits. She does not qualify for hospice based on continuing desire for TPN.   [facility] does not take her insurance.  [facility] is in network and can have a provider out in a few days. Family will need to call [phone] and ask for Barnabas Lister in new patient/intake or go online at [email or web address]   I called Mrs Ode daughter Wynona Canes, who asked me to text the information to her, which I did.  Marjie Skiff Kejuan Bekker, RN, BSN, [facility] Palliative Medicine Team 11/30/2017 11:06 AM Office [phone]

## 2017-12-01 LAB — GLUCOSE, CAPILLARY
GLUCOSE-CAPILLARY: 151 mg/dL — AB (ref 65–99)
GLUCOSE-CAPILLARY: 167 mg/dL — AB (ref 65–99)
GLUCOSE-CAPILLARY: 195 mg/dL — AB (ref 65–99)
Glucose-Capillary: 192 mg/dL — ABNORMAL HIGH (ref 65–99)
Glucose-Capillary: 193 mg/dL — ABNORMAL HIGH (ref 65–99)
Glucose-Capillary: 165 mg/dL — ABNORMAL HIGH (ref 65–99)

## 2017-12-01 LAB — CBC WITH DIFFERENTIAL/PLATELET
Basophils Absolute: 0 10*3/uL (ref 0.0–0.1)
Basophils Relative: 0 %
Eosinophils Absolute: 0.2 10*3/uL (ref 0.0–0.7)
Eosinophils Relative: 2 %
HEMATOCRIT: 32.2 % — AB (ref 36.0–46.0)
HEMOGLOBIN: 10 g/dL — AB (ref 12.0–15.0)
LYMPHS ABS: 1.1 10*3/uL (ref 0.7–4.0)
LYMPHS PCT: 11 %
MCH: 25.8 pg — ABNORMAL LOW (ref 26.0–34.0)
MCHC: 31.1 g/dL (ref 30.0–36.0)
MCV: 83 fL (ref 78.0–100.0)
MONO ABS: 1 10*3/uL (ref 0.1–1.0)
MONOS PCT: 10 %
NEUTROS ABS: 7.7 10*3/uL (ref 1.7–7.7)
NEUTROS PCT: 77 %
Platelets: 281 10*3/uL (ref 150–400)
RBC: 3.88 MIL/uL (ref 3.87–5.11)
RDW: 15.4 % (ref 11.5–15.5)
WBC: 10 10*3/uL (ref 4.0–10.5)

## 2017-12-01 MED ORDER — TRAVASOL 10 % IV SOLN
INTRAVENOUS | Status: DC
  Administered 2017-12-01: 17:00:00 via INTRAVENOUS
  Filled 2017-12-01: qty 996

## 2017-12-01 MED ORDER — INSULIN ASPART 100 UNIT/ML ~~LOC~~ SOLN
0.0000 [IU] | SUBCUTANEOUS | Status: DC
  Administered 2017-12-01 – 2017-12-02 (×8): 4 [IU] via SUBCUTANEOUS

## 2017-12-01 NOTE — Care Management Note (Signed)
Case Management Note  Patient Details  Name: Anna Cuevas MRN: [id number] Date of Birth: 06-Mar-1941  Subjective/Objective:     Pt admitted with GI bleed - found to have GB tumor and presumed hepatic metastisis               Action/Plan:  PTA from home.  Pt admitted in March - refused SNF and therefore returned home with [facility] for RN and PT.  Pt already has following equipment in the home; rolling walker, cane, transfer/tub bench, grab bars, 3:1, hospital bed and wheelchair.    Expected Discharge Date:  11/26/17               Expected Discharge Plan:  [place]  In-House Referral:     Discharge planning Services  CM Consult  Post Acute Care Choice:    Choice offered to:     DME Arranged:    DME Agency:     HH Arranged:    HH Agency:     Status of Service:     If discussed at [employer], dates discussed:    Additional Comments: 12/01/2017 Pt is now s/p PEG tube - tube is hooked to suction and currently will not allow nutrition to pass.  Plan is for pt to return to IR on Thursday for attempt to place another tube that will allow for nutrition.  Pt remains on TPN currently.  CM discussed Home with Hospice with daughter - daughter not yet aligned as she wants to continue TPN.  Attending will readdress TPN vs tube feeds.  CM also provided tentative referral to [facility] ( daughter in agreement) for potential Home TPN infusion, nursing aide and registered nurse.   Maryclare Labrador, RN 12/01/2017, 2:29 PM

## 2017-12-01 NOTE — Progress Notes (Signed)
PROGRESS NOTE    Anna Cuevas  ERD:[id number] DOB: [birth year] DOA: 11/21/2017 PCP: Kathyrn Lass, MD   Brief Narrative: Patient is a 77 year old female with past medical history of A. fib on Eliquis, hypertension, diabetes, GERD, depression who presented with hematemesis .She was recently admitted here 5 weeks ago and found to have new onset A. Fib  and was discharged on Eliquis.  Patient was having nausea, vomiting at home .Vomitus was dark-colored.  Patient underwent EGD on 5/6 and was found to have severe stenosis at  duodenal bulb.CT scan done here showed gallbladder mass, hepatic metastasis, severe distention of the stomach with gastric outlet obstruction. She underwent liver biopsy on 11/24/2017.  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  Oncology consulted .  No plan for chemotherapy but she may be a candidate for palliative radiation. IR consulted  for percutaneous G tube placement for palliation.  She  underwent  percutaneous G-tube placement on 11/30/17.  Assessment & Plan:   Principal Problem:   Gastric outlet obstruction with GB tumor Active Problems:   Atrial fibrillation with rapid ventricular response (HCC)   N&V (nausea and vomiting)   Diabetes mellitus without complication (HCC)   Pressure injury of skin   GIB (gastrointestinal bleeding)   Depression   Essential hypertension   GERD (gastroesophageal reflux disease)   Gallbladder carcinoma (HCC)   Blood loss anemia   Goals of care, counseling/discussion   Palliative care by specialist  Gastric outlet obstruction with gallbladder tumor/liver mets:  Underwent percutaneous G-tube placement on 11/30/17 .Plan is to try for possible bypass procedure on Thursday by IR for the gastric outlet obstruction so that she can be discharged to home with hospice on tube feeding.I discussed with IR about this.If this procedure fails the options are  going to home with TPN or going to home with hospice without TPN with no plan for continued nutrition. Oncology was  following .  Plan was to involve radiation oncology but patient and family denied.  No role of chemotherapy at present.  Hospice also consulted by oncology. Patient unable to take by mouth. On TPN.  Underwent liver biopsy by IR on 11/24/2017. Pathology from endoscopy and liver biopsy  confirmed adenocarcinoma most likely from the hepatobiliary source likely gallbladder. Gastric outlet obstruction  secondary to gall bladder mass with invasion of liver and duodenum. Continue NG tube for decompression.  Upper GI series shows complete gastric outlet obstruction so  stenting not possible as per GI. Elevated CEA and CA19-9 CT abdomen showed:Heterogeneous, ill-defined gallbladder invading the adjacent liver, consistent with gallbladder carcinoma. Several hepatic metastases measuring up to 2.0 cm,severe distention of the stomach with gastric outlet obstruction secondary to focal narrowing and invasion of the first portion of the duodenum by the gallbladder mass.  Hematemesis/acute upper GI bleed: Bleeding has stopped.  Will avoid blood thinners.Eliquis stopped.Patient denied repeat EGD by GI.  Acute blood loss anemia: Status post 5 units of PRBC in total..  Hemoglobin 10 today.  We will continue to monitor H&H.   Nutrition: Gastric stricture  is very tight as per EGD.  Oral intake is not possible at this time.   Hypokalemia/hypomagnesemia: Supplemented.  We will continue to monitor the levels  A. fib: Currently on normal sinus rhythm.  Rate controlled.  Not on anticoagulation due to hematemesis .  DM: We will continue to monitor blood sugars.  Hypertension: Currently blood pressure stable.  We will continue to  monitor.    DVT prophylaxis: SCD Code Status: Full code Family Communication: Discussed with daughter on phone  Disposition Plan:    Awaiting  management of gastric outlet  obstruction by IR  Consultants: General surgery, GI,Oncology  Procedures: Liver biopsy on 11/24/2017,EGD  Antimicrobials: None   Subjective:  Patient seen and examined at the bedside this morning.  Denies any complaints.  No abdominal pain.  Really wants to go home.  Objective: Vitals:   12/01/17 0400 12/01/17 0727 12/01/17 0817 12/01/17 1200  BP: 133/67  (!) 142/87 (!) 165/53  Pulse: 88  89   Resp: 20     Temp: 98.4 F (36.9 C)  98.4 F (36.9 C) (!) 97.4 F (36.3 C)  TempSrc: Oral  Oral Oral  SpO2: 98% 98% 97%   Weight:      Height:        Intake/Output Summary (Last 24 hours) at 12/01/2017 1312 Last data filed at 12/01/2017 1200 Gross per 24 hour  Intake 1831 ml  Output 3850 ml  Net -2019 ml   Filed Weights   11/22/17 1200 11/23/17 1111 11/27/17 0532  Weight: 81.4 kg (179 lb 7.3 oz) 81.2 kg (179 lb) 81.4 kg (179 lb 7.3 oz)    Examination:  General exam: Appears calm and comfortable ,Not in distress,average built HEENT:PERRL,Oral mucosa moist, Ear/Nose normal on gross exam Respiratory system: Bilateral equal air entry, normal vesicular breath sounds, no wheezes or crackles  Cardiovascular system: S1 & S2 heard, RRR. No JVD, murmurs, rubs, gallops or clicks. Gastrointestinal system: Abdomen is nondistended, soft and nontender. No organomegaly or masses felt. Slow bowel sounds.G tube  Central nervous system: Alert and oriented. No focal neurological deficits. Extremities: No edema, no clubbing ,no cyanosis, distal peripheral pulses palpable. Skin: No rashes, lesions or ulcers,no icterus ,no pallor Psychiatry: Judgement and insight appear normal. Mood & affect appropriate.     Data Reviewed: I have personally reviewed following labs and imaging studies  CBC: Recent Labs  Lab 11/28/17 0410 11/29/17 0429 11/29/17 1817 11/29/17 2230 11/30/17 0632 12/01/17 0421  WBC 6.0 6.4 10.7* 10.2 7.9 10.0  NEUTROABS 4.3 4.6 8.0*  --  5.9 7.7  HGB 7.6* 7.7* 11.1* 9.9* 9.6*  10.0*  HCT 23.9* 24.9* 35.0* 31.3* 30.5* 32.2*  MCV 82.7 81.6 81.0 81.3 81.3 83.0  PLT 233 252 340 269 268 509   Basic Metabolic Panel: Recent Labs  Lab 11/26/17 0524 11/27/17 0738 11/28/17 0410 11/28/17 0513 11/29/17 0429 11/29/17 2230 11/30/17 0632  NA 136 135 134* 136 136 135 138  K 3.5 4.0 6.1* 3.9 4.4 4.2 4.3  CL 99* 99* 100* 99* 99* 99* 102  CO2 '25 29 29 30 30 29 29  '$ GLUCOSE 184* 211* 533* 182* 192* 300* 187*  BUN 5* '9 11 11 16 '$ 25* 27*  CREATININE 0.66 0.46 0.52 0.48 0.50 0.61 0.48  CALCIUM 8.1* 8.2* 8.2* 8.2* 8.4* 8.5* 8.4*  MG 1.6* 1.7 2.3  --  1.8 2.0 2.0  PHOS 2.8 3.1 5.4*  --  3.7  --  3.7   GFR: Estimated Creatinine Clearance: 59.5 mL/min (by C-G formula based on SCr of 0.48 mg/dL). Liver Function Tests: Recent Labs  Lab 11/25/17 0327 11/26/17 0524 11/30/17 0632  AST '17 16 17  '$ ALT '15 15 16  '$ ALKPHOS 112 116 123  BILITOT 1.0 0.8 0.4  PROT 4.8* 5.2* 5.3*  ALBUMIN 2.5* 2.5* 2.3*   No results for input(s): LIPASE, AMYLASE in the last 168 hours. No results for  input(s): AMMONIA in the last 168 hours. Coagulation Profile: Recent Labs  Lab 11/30/17 0632  INR 1.05   Cardiac Enzymes: No results for input(s): CKTOTAL, CKMB, CKMBINDEX, TROPONINI in the last 168 hours. BNP (last 3 results) No results for input(s): PROBNP in the last 8760 hours. HbA1C: No results for input(s): HGBA1C in the last 72 hours. CBG: Recent Labs  Lab 11/30/17 1951 11/30/17 2341 12/01/17 0519 12/01/17 0823 12/01/17 1223  GLUCAP 193* 169* 192* 193* 195*   Lipid Profile: Recent Labs    11/30/17 0632  TRIG 115   Thyroid Function Tests: No results for input(s): TSH, T4TOTAL, FREET4, T3FREE, THYROIDAB in the last 72 hours. Anemia Panel: No results for input(s): VITAMINB12, FOLATE, FERRITIN, TIBC, IRON, RETICCTPCT in the last 72 hours. Sepsis Labs: No results for input(s): PROCALCITON, LATICACIDVEN in the last 168 hours.  Recent Results (from the past 240 hour(s))    Culture, blood (Routine X 2) w Reflex to ID Panel     Status: None   Collection Time: 11/22/17  6:05 AM  Result Value Ref Range Status   Specimen Description BLOOD LEFT HAND  Final   Special Requests   Final    BOTTLES DRAWN AEROBIC AND ANAEROBIC Blood Culture adequate volume   Culture   Final    NO GROWTH 5 DAYS Performed at [facility], [address] [place], [place] [address]    Report Status 11/27/2017 FINAL  Final  Culture, blood (Routine X 2) w Reflex to ID Panel     Status: None   Collection Time: 11/22/17  6:13 AM  Result Value Ref Range Status   Specimen Description BLOOD LEFT ANTECUBITAL  Final   Special Requests   Final    BOTTLES DRAWN AEROBIC AND ANAEROBIC Blood Culture adequate volume   Culture   Final    NO GROWTH 5 DAYS Performed at [facility], [place] [address] [place], [place] [address]    Report Status 11/27/2017 FINAL  Final  MRSA PCR Screening     Status: None   Collection Time: 11/22/17  1:44 PM  Result Value Ref Range Status   MRSA by PCR NEGATIVE NEGATIVE Final    Comment:        The GeneXpert MRSA Assay (FDA approved for NASAL specimens only), is one component of a comprehensive MRSA colonization surveillance program. It is not intended to diagnose MRSA infection nor to guide or monitor treatment for MRSA infections. Performed at [facility], [place] [address] [place],  [address]          Radiology Studies: Ir Gastrostomy Tube Mod Sed  Result Date: 11/30/2017 CLINICAL DATA:  Gastric outlet obstruction from gallbladder carcinoma. Percutaneous gastrostomy tube requested for decompression. EXAM: PERCUTANEOUS GASTROSTOMY TUBE PLACEMENT ANESTHESIA/SEDATION: 1.5 mg IV Versed; 50 mcg IV Fentanyl. Total Moderate Sedation Time 15 minutes. The patient's level of consciousness and physiologic status were continuously monitored during the procedure by Radiology nursing. CONTRAST:  20 mL Isovue-300 injected into the stomach.  MEDICATIONS: 2 g IV Ancef. IV antibiotic was administered in an appropriate time interval prior to needle puncture of the skin. FLUOROSCOPY TIME:  4 minutes.  177.5 mGy. PROCEDURE: The procedure, risks, benefits, and alternatives were explained to the patient. Questions regarding the procedure were encouraged and answered. The patient understands and consents to the procedure. A 5-French catheter was then advanced through the patient's mouth under fluoroscopy into the esophagus and to the level of the stomach. This catheter was  used to insufflate the stomach with air under fluoroscopy. The abdominal wall was prepped with chlorhexidine in a sterile fashion, and a sterile drape was applied covering the operative field. A sterile gown and sterile gloves were used for the procedure. Local anesthesia was provided with 1% Lidocaine. A skin incision was made in the upper abdominal wall. Under fluoroscopy, an 18 gauge trocar needle was advanced into the stomach. Contrast injection was performed to confirm intraluminal position of the needle tip. A single T tack was then deployed in the lumen of the stomach. This was brought up to tension at the skin surface. Over a guidewire, a 9-French sheath was advanced into the lumen of the stomach. The wire was left in place as a safety wire. A loop snare device from a percutaneous gastrostomy kit was then advanced into the stomach. A floppy guide wire was advanced through the orogastric catheter under fluoroscopy in the stomach. The loop snare advanced through the percutaneous gastric access was used to snare the guide wire. This allowed withdrawal of the loop snare out of the patient's mouth by retraction of the orogastric catheter and wire. A 20-French bumper retention gastrostomy tube was looped around the snare device. It was then pulled back through the patient's mouth. The retention bumper was brought up to the anterior gastric wall. The T tack suture was cut at the skin. The  exiting gastrostomy tube was cut to appropriate length and a feeding adapter applied. The gastrostomy tube was connected to wall suction. The catheter was injected with contrast material to confirm position and a fluoroscopic spot image saved. The tube was then flushed with saline. A dressing was applied over the gastrostomy exit site. COMPLICATIONS: None. FINDINGS: After placement, the tip of the gastrostomy tube lies in the body of the stomach. After gastrostomy tube placement, approximately 700 mL of fluid and debris was able to be aspirated from the gastric lumen after connecting the gastrostomy tube to wall suction. IMPRESSION: Percutaneous gastrostomy with placement of a 20-French bumper retention tube in the body of the stomach. 700 mL of fluid and debris was suctioned from the stomach after gastrostomy tube placement. Electronically Signed   By: Aletta Edouard M.D.   On: 11/30/2017 17:42        Scheduled Meds: . insulin aspart  0-20 Units Subcutaneous Q4H  . mouth rinse  15 mL Mouth Rinse BID  . metoprolol tartrate  5 mg Intravenous Q6H   Continuous Infusions: . sodium chloride 20 mL/hr at 11/29/17 1600  . sodium chloride    . TPN ADULT (ION) 83 mL/hr at 11/30/17 1800  . TPN ADULT (ION)       LOS: 10 days    Time spent: 35 mins.More than 50% of that time was spent in counseling and/or coordination of care.      Shelly Coss, MD [facility] Pager [phone]  If 7PM-7AM, please contact night-coverage [email or web address] Password [email or web address] 12/01/2017, 1:12 PM

## 2017-12-01 NOTE — Progress Notes (Addendum)
PHARMACY - ADULT TOTAL PARENTERAL NUTRITION CONSULT NOTE   Pharmacy Consult:  TPN Indication:  GOO  Patient Measurements: Height: 5\' 3"  (160 cm) Weight: 179 lb 7.3 oz (81.4 kg) IBW/kg (Calculated) : 52.4 TPN AdjBW (KG): 59.6 Body mass index is 31.79 kg/m.  Assessment:  75 YOF presented on 11/21/17 with hematemesis.  Patient was hospitalized 5 weeks ago d/t N/V and continued to have those symptoms post discharge.  CT concerning for GOO and gallbladder carcinoma with hepatic mets as well as choledocholithiasis.  She is s/p EGD with duodenal biopsy on 5/6 and liver biopsy on 11/24/17.  Given expected prolonged NPO status and while work-up is in progress, pharmacy consulted to initiate TPN.  Patient reported that she gained weight instead of losing weight, and was eating about 50% of her meals for 2 weeks PTA. She is at risk for refeeding syndrome.  GI: baseline prealbumin low at 10, NG O/P 2500 mL (dark brown) - Protonix gtt. Gastric stricture is very tight per EGD and patient refusing repeat EGD. Venting G-tube placed 5/13 for palliation. Prealb 9.9 Endo: DM on metformin PTA - CBGs 169-193  Insulin requirements in the past 24 hours: 18 unit SSI + 30 units of regular insulin in TPN Lytes: Labs from 5/13 - K 4.3, Phos 3.7, and Mag 2, CoCa 9.6, others normal Renal: SCr 0.48 stable, BUN 27 - UOP 0.3 ml/kg/hr, NS at 20 ml/hr (net + 6L) Pulm: stable on RA Cards: HTN - BP normal, HR mostly controlled (intermitttent SVT noted)- IV metoprolol, PRN hydralazine AC: Eliquis PTA for Afib dx in March, s/p KCentra on 5/4 - Hgb 9.6 (receiving 2 units PRBC), plts WNL Hepatobil: LFTs / tbili / TG WNL Neuro: A&O - PRN APAP ID: [email or web address], WBC WNL - not on abx  TPN Access: PICC placed 5/8 TPN start date: 11/25/17  Nutritional Goals (RD rec 11/27/17): 1750-1950 kCal and 90-105 gm protein per day, fluid >1.7L   Current Nutrition:  NPO  Plan:  Updated TPN per RD rec on 11/27/17 (goal rate 83 ml/hr) Continue  TPN at 83 ml/hr TPN will provide 100g AA, 292g CHO and 42g ILE for a total of 1800 kCal, meeting 100% of patient's needs. Electrolytes in TPN: continue increased Na/K/Mg, slightly reduced Phos, standard calcium, maximize Cl Daily multivitamin and trace elements in TPN Change to resistant SSI Q4H Continue 30 units of regular insulin in TPN F/U TPN labs   Renold Genta, PharmD, BCPS Clinical Pharmacist Clinical phone for 12/01/2017 until 3p is [phone] After 3p, please call Main Rx at [phone] for assistance 12/01/2017 7:23 AM

## 2017-12-01 NOTE — Progress Notes (Signed)
Referring Physician(s): Dr Leonarda Salon  Supervising Physician: Daryll Brod  Patient Status:  [facility] - In-pt  Chief Complaint:  Gastric outlet obstruction Gastric tube placed in IR 5/13  Subjective:  Up in bed Looks rested No complaints  Allergies: Patient has no known allergies.  Medications: Prior to Admission medications   Medication Sig Start Date End Date Taking? Authorizing Provider  acetaminophen (TYLENOL) 325 MG tablet Take 2 tablets (650 mg total) by mouth every 6 (six) hours as needed for mild pain (or Fever >/= 101). 10/20/17  Yes Emokpae, Courage, MD  apixaban (ELIQUIS) 5 MG TABS tablet Take 1 tablet (5 mg total) by mouth 2 (two) times daily. 10/20/17  Yes Emokpae, Courage, MD  bisacodyl (DULCOLAX) 5 MG EC tablet Take 5 mg by mouth daily as needed for moderate constipation.   Yes [provider]  diltiazem (CARDIZEM CD) 360 MG 24 hr capsule Take 1 capsule (360 mg total) by mouth daily. 10/20/17 10/20/18 Yes Emokpae, Courage, MD  docusate sodium (COLACE) 100 MG capsule Take 200 mg by mouth as needed for mild constipation.   Yes [provider]  metFORMIN (GLUCOPHAGE) 500 MG tablet Take 1 tablet (500 mg total) by mouth daily. 10/20/17  Yes Emokpae, Courage, MD  metoprolol tartrate (LOPRESSOR) 25 MG tablet Take 1 tablet (25 mg total) by mouth 2 (two) times daily. 10/20/17  Yes Emokpae, Courage, MD  montelukast (SINGULAIR) 10 MG tablet Take 10 mg by mouth at bedtime.   Yes [provider]  ondansetron (ZOFRAN) 4 MG tablet Take 1 tablet (4 mg total) by mouth every 6 (six) hours as needed for nausea. 10/20/17  Yes Emokpae, Courage, MD  pantoprazole (PROTONIX) 40 MG tablet Take 1 tablet (40 mg total) by mouth daily. 10/21/17  Yes Emokpae, Courage, MD  polyethylene glycol (MIRALAX / GLYCOLAX) packet Take 17 g by mouth daily as needed for moderate constipation. 10/20/17  Yes Emokpae, Courage, MD  sertraline (ZOLOFT) 100 MG tablet Take 200 mg by mouth daily.  08/10/17  Yes [provider]  loratadine (CLARITIN) 10 MG tablet Take 10 mg by mouth at bedtime.    [provider]     Vital Signs: BP 133/67 (BP Location: Left Wrist)   Pulse 88   Temp 98.4 F (36.9 C) (Oral)   Resp 20   Ht _0  (1.6 m)   Wt 179 lb 7.3 oz (81.4 kg)   SpO2 98%   BMI 31.79 kg/m   Physical Exam  Abdominal: Soft. Bowel sounds are normal. There is no tenderness.  Neurological: She is alert.  Skin: Skin is warm and dry.  Site is clean and dry NT No bleeding  Nursing note and vitals reviewed.   Imaging: Ir Gastrostomy Tube Mod Sed  Result Date: 11/30/2017 CLINICAL DATA:  Gastric outlet obstruction from gallbladder carcinoma. Percutaneous gastrostomy tube requested for decompression. EXAM: PERCUTANEOUS GASTROSTOMY TUBE PLACEMENT ANESTHESIA/SEDATION: 1.5 mg IV Versed; 50 mcg IV Fentanyl. Total Moderate Sedation Time 15 minutes. The patient's level of consciousness and physiologic status were continuously monitored during the procedure by Radiology nursing. CONTRAST:  20 mL Isovue-300 injected into the stomach. MEDICATIONS: 2 g IV Ancef. IV antibiotic was administered in an appropriate time interval prior to needle puncture of the skin. FLUOROSCOPY TIME:  4 minutes.  177.5 mGy. PROCEDURE: The procedure, risks, benefits, and alternatives were explained to the patient. Questions regarding the procedure were encouraged and answered. The patient understands and consents to the procedure. A 5-French catheter was  then advanced through the patient's mouth under fluoroscopy into the esophagus and to the level of the stomach. This catheter was used to insufflate the stomach with air under fluoroscopy. The abdominal wall was prepped with chlorhexidine in a sterile fashion, and a sterile drape was applied covering the operative field. A sterile gown and sterile gloves were used for the procedure. Local anesthesia was provided with 1% Lidocaine. A skin incision was made  in the upper abdominal wall. Under fluoroscopy, an 18 gauge trocar needle was advanced into the stomach. Contrast injection was performed to confirm intraluminal position of the needle tip. A single T tack was then deployed in the lumen of the stomach. This was brought up to tension at the skin surface. Over a guidewire, a 9-French sheath was advanced into the lumen of the stomach. The wire was left in place as a safety wire. A loop snare device from a percutaneous gastrostomy kit was then advanced into the stomach. A floppy guide wire was advanced through the orogastric catheter under fluoroscopy in the stomach. The loop snare advanced through the percutaneous gastric access was used to snare the guide wire. This allowed withdrawal of the loop snare out of the patient's mouth by retraction of the orogastric catheter and wire. A 20-French bumper retention gastrostomy tube was looped around the snare device. It was then pulled back through the patient's mouth. The retention bumper was brought up to the anterior gastric wall. The T tack suture was cut at the skin. The exiting gastrostomy tube was cut to appropriate length and a feeding adapter applied. The gastrostomy tube was connected to wall suction. The catheter was injected with contrast material to confirm position and a fluoroscopic spot image saved. The tube was then flushed with saline. A dressing was applied over the gastrostomy exit site. COMPLICATIONS: None. FINDINGS: After placement, the tip of the gastrostomy tube lies in the body of the stomach. After gastrostomy tube placement, approximately 700 mL of fluid and debris was able to be aspirated from the gastric lumen after connecting the gastrostomy tube to wall suction. IMPRESSION: Percutaneous gastrostomy with placement of a 20-French bumper retention tube in the body of the stomach. 700 mL of fluid and debris was suctioned from the stomach after gastrostomy tube placement. Electronically Signed   By:  Aletta Edouard M.D.   On: 11/30/2017 17:42    Labs:  CBC: Recent Labs    11/29/17 1817 11/29/17 2230 11/30/17 0632 12/01/17 0421  WBC 10.7* 10.2 7.9 10.0  HGB 11.1* 9.9* 9.6* 10.0*  HCT 35.0* 31.3* 30.5* 32.2*  PLT 340 269 268 281    COAGS: Recent Labs    11/21/17 1715 11/21/17 2352 11/24/17 0304 11/30/17 0632  INR 1.33 1.30 1.15 1.05  APTT  --  32  --   --     BMP: Recent Labs    11/28/17 0513 11/29/17 0429 11/29/17 2230 11/30/17 0632  NA 136 136 135 138  K 3.9 4.4 4.2 4.3  CL 99* 99* 99* 102  CO2 _0 GLUCOSE 182* 192* 300* 187*  BUN 11 16 25* 27*  CALCIUM 8.2* 8.4* 8.5* 8.4*  CREATININE 0.48 0.50 0.61 0.48  GFRNONAA >60 >60 >60 >60  GFRAA >60 >60 >60 >60    LIVER FUNCTION TESTS: Recent Labs    11/24/17 0304 11/25/17 0327 11/26/17 0524 11/30/17 0632  BILITOT 0.7 1.0 0.8 0.4  AST _1 ALT _2 ALKPHOS 102  112 116 123  PROT 4.6* 4.8* 5.2* 5.3*  ALBUMIN 2.4* 2.5* 2.5* 2.3*    Assessment and Plan:  G tube intact May use  Electronically Signed: Annetta Deiss A, PA-C 12/01/2017, 7:31 AM   I spent a total of 15 Minutes at the the patient's bedside AND on the patient's hospital floor or unit, greater than 50% of which was counseling/coordinating care for G tube placement

## 2017-12-01 NOTE — Care Management Important Message (Signed)
Important Message  Patient Details  Name: Anna Cuevas MRN: [id number] Date of Birth: 07-05-1941   Medicare Important Message Given:  Yes    Ulric Salzman P Royann Wildasin 12/01/2017, 1:45 PM

## 2017-12-02 LAB — GLUCOSE, CAPILLARY
GLUCOSE-CAPILLARY: 188 mg/dL — AB (ref 65–99)
GLUCOSE-CAPILLARY: 118 mg/dL — AB (ref 65–99)
Glucose-Capillary: 181 mg/dL — ABNORMAL HIGH (ref 65–99)
Glucose-Capillary: 192 mg/dL — ABNORMAL HIGH (ref 65–99)

## 2017-12-02 LAB — BASIC METABOLIC PANEL
ANION GAP: 7 (ref 5–15)
BUN: 35 mg/dL — AB (ref 6–20)
CO2: 31 mmol/L (ref 22–32)
Calcium: 9.1 mg/dL (ref 8.9–10.3)
Chloride: 102 mmol/L (ref 101–111)
Creatinine, Ser: 0.53 mg/dL (ref 0.44–1.00)
Glucose, Bld: 218 mg/dL — ABNORMAL HIGH (ref 65–99)
POTASSIUM: 4.3 mmol/L (ref 3.5–5.1)
SODIUM: 140 mmol/L (ref 135–145)

## 2017-12-02 LAB — MAGNESIUM: MAGNESIUM: 2.1 mg/dL (ref 1.7–2.4)

## 2017-12-02 LAB — PHOSPHORUS: PHOSPHORUS: 4.6 mg/dL (ref 2.5–4.6)

## 2017-12-02 MED ORDER — TRAVASOL 10 % IV SOLN
INTRAVENOUS | Status: DC
  Filled 2017-12-02: qty 996

## 2017-12-02 NOTE — Progress Notes (Signed)
Daily Progress Note   Patient Name: Anna Cuevas       Date: 12/02/2017 DOB: 1941/07/20  Age: 77 y.o. MRN#: [id number] Attending Physician: Shelly Coss, MD Primary Care Physician: Kathyrn Lass, MD Admit Date: 11/21/2017  Reason for Consultation/Follow-up: Establishing goals of care, Hospice Evaluation and Psychosocial/spiritual support  Subjective: Tired, not nauseous, feels better than yesterday, eager to go home and rest  Length of Stay: 11  Current Medications: Scheduled Meds:  . insulin aspart  0-20 Units Subcutaneous Q4H  . mouth rinse  15 mL Mouth Rinse BID  . metoprolol tartrate  5 mg Intravenous Q6H    Continuous Infusions: . sodium chloride 20 mL/hr at 11/29/17 1600  . sodium chloride    . TPN ADULT (ION) 83 mL/hr at 12/01/17 1900  . TPN ADULT (ION)      PRN Meds: acetaminophen **OR** acetaminophen, hydrALAZINE, iopamidol, menthol-cetylpyridinium, morphine injection, ondansetron **OR** ondansetron (ZOFRAN) IV, phenol, sodium chloride flush  Physical Exam  Constitutional: She is oriented to person, place, and time. She appears ill. No distress.  HENT:  Head: Normocephalic and atraumatic.  Cardiovascular: Regular rhythm.  Pulmonary/Chest: Breath sounds normal. No accessory muscle usage. No tachypnea. No respiratory distress.  Abdominal:  Drain in place attached to suction, bilious drainage  Musculoskeletal:       Right lower leg: She exhibits no edema.       Left lower leg: She exhibits no edema.  Neurological: She is alert and oriented to person, place, and time.  Skin: Skin is warm and dry. She is not diaphoretic.            Vital Signs: BP (!) 157/47 (BP Location: Left Arm)   Pulse 89   Temp 98.2 F (36.8 C) (Oral)   Resp 19   Ht '5\' 3"'$  (1.6 m)   Wt 81.4 kg (179  lb 7.3 oz)   SpO2 100%   BMI 31.79 kg/m  SpO2: SpO2: 100 % O2 Device: O2 Device: Room Air O2 Flow Rate: O2 Flow Rate (L/min): 2 L/min  Intake/output summary:   Intake/Output Summary (Last 24 hours) at 12/02/2017 1241 Last data filed at 12/02/2017 1228 Gross per 24 hour  Intake 2497.06 ml  Output 3550 ml  Net -1052.94 ml   LBM: Last BM Date: 11/21/17 Baseline Weight: Weight: 81.4 kg (179 lb 7.3 oz) Most recent weight: Weight: 81.4 kg (179 lb 7.3 oz)       Palliative Assessment/Data: PPS 20%    Flowsheet Rows     Most Recent Value  Intake Tab  Referral Department  Hospitalist  Unit at Time of Referral  Intermediate Care Unit  Palliative Care Primary Diagnosis  Cancer  Date Notified  11/26/17  Palliative Care Type  New Palliative care  Reason for referral  Clarify Goals of Care, Counsel Regarding Hospice, Psychosocial or Spiritual support  Date of Admission  11/21/17  Date first seen by Palliative Care  11/27/17  # of days Palliative referral response time  1 Day(s)  # of days IP prior to Palliative referral  5  Clinical Assessment  Palliative Performance Scale Score  40%  Pain Max last 24 hours  Not able to report  Pain Min  Last 24 hours  Not able to report  Dyspnea Max Last 24 Hours  Not able to report  Dyspnea Min Last 24 hours  Not able to report  Nausea Max Last 24 Hours  Not able to report  Nausea Min Last 24 Hours  Not able to report  Anxiety Max Last 24 Hours  Not able to report  Anxiety Min Last 24 Hours  Not able to report  Other Max Last 24 Hours  Not able to report  Psychosocial & Spiritual Assessment  Palliative Care Outcomes  Patient/Family meeting held?  Yes  Who was at the meeting?  pt, dtr      Patient Active Problem List   Diagnosis Date Noted  . Goals of care, counseling/discussion   . Palliative care by specialist   . Gastric outlet obstruction with GB tumor 11/24/2017  . Blood loss anemia 11/22/2017  . GIB (gastrointestinal bleeding)  11/21/2017  . Depression 11/21/2017  . Essential hypertension 11/21/2017  . GERD (gastroesophageal reflux disease) 11/21/2017  . 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 Pressure injury of skin 10/17/2017  . Atrial fibrillation with rapid ventricular response ([place]) 10/15/2017  . Hyponatremia 10/15/2017  . N&V (nausea and vomiting) 10/15/2017  . Diabetes mellitus without complication [facility])     Palliative Care Assessment & Plan   HPI: 77 y.o. female  with past medical history of atrial fib, hypertension, diabetes admitted on 11/21/2017 with nausea vomiting, hematemesis.  Patient was found to have gastric outlet obstruction per EGD.  CT of the abdomen shows ill-defined gallbladder invading into the liver; metastatic disease to liver, severe distention, gastric outlet obstruction.  CT of the chest was negative for metastatic spread to the lungs.  She has been seen by Dr. 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  She is being considered for G-tube placement by IR for palliative/comfort measures.  She is currently on TPN.  Consult ordered for goals of care , hospice evaluation.   Assessment: I have reviewed medical records including EPIC notes, labs and imaging, assessed the patient and then met at the bedside along with daughter, Anna Cuevas,  to discuss diagnosis prognosis, GOC, EOL wishes, disposition and options.  Patient has been seen by PMT earlier in hospitalization and was considering options of home w/ TPN vs home w/hospice.   We discussed her sources of support - her only daughter, Anna Cuevas, son-in-law, and 5 grandchildren. Also has family friends that are available for support.  Patient and family made it clear that their goal is to be at home and discontinue TPN - they want to go home with hospice support. We discussed hospice philosophy of care. We discussed  continuing the drain and family is okay with emptying drain as needed.   Discussed symptom management - denies pain and nausea has been relieved by G tube. Discussed methods to keep mouth moist.    Questions and concerns were addressed. The family was encouraged to call with questions or concerns.   Recommendations/Plan:  Case management for hospice at home  Plan for discharge today with hospice support  Goals of Care and Additional Recommendations:  Limitations on Scope of Treatment: Avoid Hospitalization  Code Status:  DNR  Prognosis:   < 2 weeks  Discharge Planning:  Home with Hospice  Care plan was discussed with patient, daughter, case manager Anna Cuevas, and Dr. Tawanna Solo  Thank you for allowing  the Palliative Medicine Team to assist in the care of this patient.   Time In: 1330 Time Out: 1355 Total Time 25 minutes Prolonged Time Billed  no       Greater than 50%  of this time was spent counseling and coordinating care related to the above assessment and plan.  Juel Burrow, DNP, AGNP-C Palliative Medicine Team Team Phone # [phone]

## 2017-12-02 NOTE — Progress Notes (Signed)
[facility] Liaison: RN visit  Notified by Aldona Bar [facility] of patient/family request for [facility] services at home after discharge. Chart and patient information under review by [facility] physician. Hospice eligibility pending at this time.  Writer spoke with patient at bedside and daughter, Wynona Canes by phone to initiate education related to hospice philosophy, services and team approach to care.         Daughter verbalized understanding of information given. Per discussion, plan is for discharge to home by private vehicle today.  Please send signed and completed DNR form home with patient/family. Patient will need prescriptions for discharge comfort medications.  DME needs have been discussed, patient currently has the following equipment in the home: rolling walker, cane, transfer tub bench,grab bars, hospital bed and W/C.  Patient/family requests the following DME for delivery to the home:   suction. [facility] equipment manager has been notified and will contact [place] to arrange delivery to the home. Home address has been verified and is correct in the chart.   Vinnie Level is the family member to contact to arrange time of delivery.  [facility] aware of the above. Please notify [facility] when patient is ready to leave the unit at discharge. (Call [phone] or [phone] after 5pm.) [facility] information and contact numbers given to [facility] at time of visit. Above information shared with  Aldona Bar, [facility].  Please call with any hospice related questions.  Thank you for this referral.  Farrel Gordon, RN, [facility] Liaison [phone] ? Hospital liaisons are now on [place].

## 2017-12-02 NOTE — Progress Notes (Signed)
Pt ready for discharge to home with daughter.  Discharge instructions reviewed with pt and daughter, suzanne.  Verbalized understanding.  All equipment for discharge has been delivered.  DNR sheet given to daughter.  Pt discharged to home via private vehicle.

## 2017-12-02 NOTE — Plan of Care (Signed)
Pt going home with daughter and hospice care

## 2017-12-02 NOTE — Plan of Care (Signed)
Continue current care plan 

## 2017-12-02 NOTE — Progress Notes (Signed)
PHARMACY - ADULT TOTAL PARENTERAL NUTRITION CONSULT NOTE   Pharmacy Consult:  TPN Indication:  GOO  Patient Measurements: Height: 5\' 3"  (160 cm) Weight: 179 lb 7.3 oz (81.4 kg) IBW/kg (Calculated) : 52.4 TPN AdjBW (KG): 59.6 Body mass index is 31.79 kg/m.  Assessment:  89 YOF presented on 11/21/17 with hematemesis.  Patient was hospitalized 5 weeks ago d/t N/V and continued to have those symptoms post discharge.  CT concerning for GOO and gallbladder carcinoma with hepatic mets as well as choledocholithiasis.  She is s/p EGD with duodenal biopsy on 5/6 and liver biopsy on 11/24/17.  Given expected prolonged NPO status and while work-up is in progress, pharmacy consulted to manage TPN.  Patient reported that she gained weight instead of losing weight, and was eating about 50% of her meals for 2 weeks PTA. She is at risk for refeeding syndrome.  GI: prealbumin low at 9.9, NG O/P down to 650 mL (dark brown), drain O/P 2082mL.  Gastric stricture is very tight per EGD and pt refusing repeat EGD. Venting G-tube placed 5/13 for palliation.   Endo: DM on metformin PTA - CBGs slightly elevated Insulin requirements in the past 24 hours: 22 units SSI + 30 units in TPN Lytes: all WNL except calcium at 10.46 (Phos trending up) Renal: SCr 0.53 stable, BUN 35 - UOP 0.4 ml/kg/hr, NS at 20 ml/hr (net + 6L) Pulm: stable on RA Cards: HTN - BP slightly high, HR high normal (intermitttent SVT) - IV metoprolol, PRN hydralazine AC: Eliquis PTA for Afib dx in March, s/p KCentra on 5/4 - hgb 10, plts WNL Hepatobil: LFTs / tbili / TG WNL Neuro: A&O - PRN APAP ID: [email or web address], WBC WNL - not on abx TPN Access: PICC placed 11/25/17 TPN start date: 11/25/17  Nutritional Goals (RD rec 11/27/17): 1750-1950 kCal and 90-105 gm protein per day, fluid >1.7L   Current Nutrition:  TPN   Plan:  Continue TPN at 83 ml/hr, providing 100g AA, 292g CHO and 42g ILE for a total of 1811 kCal, meeting 100% of patient's  needs. Electrolytes in TPN: reduce calcium and Phos, maximize chloride Daily multivitamin and trace elements in TPN Continue resistant SSI Q4H + increase regular insulin in TPN to 43 units Consider cycling TPN if unable to use G-tube for feeding F/U AM labs   [place] D. Mina Marble, PharmD, BCPS, BCCCP Pager:  [phone] 12/02/2017, 10:41 AM

## 2017-12-02 NOTE — Discharge Summary (Signed)
Physician Discharge Summary  Anna Cuevas WCB:[id number] DOB: 30-Dec-1940 DOA: 11/21/2017  PCP: Kathyrn Lass, MD  Admit date: 11/21/2017 Discharge date: 12/02/2017  Admitted From: Home Disposition:  Home  Discharge Condition:Hospice CODE STATUS:DNR Diet recommendation: Clear liquid  Brief/Interim Summary:  Patient is a 77 year old female with past medical history of A. fib on Eliquis, hypertension, diabetes, GERD, depression who presented with hematemesis .She was recently admitted here 5 weeks ago and found to have new onset A. Fib  and was discharged on Eliquis.  Patient was having nausea, vomiting at home .Vomitus was dark-colored.  Patient underwent EGD on 5/6 and was found to have severe stenosis at  duodenal bulb.CT scan done here showed gallbladder mass, hepatic metastasis, severe distention of the stomach with gastric outlet obstruction. She underwent liver biopsy on 11/24/2017.  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  Oncology consulted .  No plan for chemotherapy but she may be a candidate for palliative radiation. IR consulted  for percutaneous G tube placement for palliation.  She  underwent  percutaneous G-tube placement on 11/30/17. Patient is being discharged today to home with hospice after extensive discussion with the family.  Following problems were addressed during her hospitalization:  Gastric outlet obstruction with gallbladder tumor/liver mets:  Underwent percutaneous G-tube placement on 11/30/17 for decompression. After extensive discussion with the family and the patient, they have decided to be discharged to home with hospice. Continue drainage of the G-tube at home.  She can be tried on clear liquid diet .Since there is complete gastric outlet obstruction, she will likely starve. She might have life of a week or two. Oncology was  following .  Plan was to involve radiation  oncology but patient and family denied.  No role of chemotherapy at present. Patient unable to take by mouth. Was on TPN.  Underwent liver biopsy by IR on 11/24/2017. Pathology from endoscopy and liver biopsy  confirmed adenocarcinoma most likely from the hepatobiliary source likely gallbladder. Gastric outlet obstruction  secondary to gall bladder mass with invasion of liver and duodenum. Sh was earlier on NG tube for decompression.  Upper GI series shows complete gastric outlet obstruction so  stenting not possible as per GI. Elevated CEA and CA19-9 CT abdomen showed:Heterogeneous, ill-defined gallbladder invading the adjacent liver, consistent with gallbladder carcinoma. Several hepatic metastases measuring up to 2.0 cm,severe distention of the stomach with gastric outlet obstruction secondary to focal narrowing and invasion of the first portion of the duodenum by the gallbladder mass.  Hematemesis/acute upper GI bleed: Bleeding has stopped.  Will avoid blood thinners.Eliquis stopped.Patient denied repeat EGD by GI.  Acute blood loss anemia: Status post 5 units of PRBC in total..  Hemoglobin 10 yesterday.  Nutrition: Gastric stricture  is very tight as per EGD.  Oral intake is not possible at this time. Can try clear liquid diet on discharge at home.  Hypokalemia/hypomagnesemia: Supplemented.    A. fib: Currently on normal sinus rhythm.  Rate controlled.  Not on anticoagulation due to hematemesis .  Hypertension: Currently blood pressure stable.    Discharge Diagnoses:  Principal Problem:   Gastric outlet obstruction with GB tumor Active Problems:   Atrial fibrillation with rapid ventricular response (HCC)   N&V (nausea and vomiting)   Diabetes mellitus without complication (HCC)   Pressure injury of skin   GIB (gastrointestinal bleeding)   Depression   Essential hypertension   GERD (gastroesophageal reflux disease)  Gallbladder carcinoma (HCC)   Blood loss anemia   Goals  of care, counseling/discussion   Palliative care by specialist    Discharge Instructions  Discharge Instructions    Diet general   Complete by:  As directed    Clear liquid diet   Increase activity slowly   Complete by:  As directed      Allergies as of 12/02/2017   No Known Allergies     Medication List    STOP taking these medications   acetaminophen 325 MG tablet Commonly known as:  TYLENOL   apixaban 5 MG Tabs tablet Commonly known as:  ELIQUIS   bisacodyl 5 MG EC tablet Commonly known as:  DULCOLAX   diltiazem 360 MG 24 hr capsule Commonly known as:  CARDIZEM CD   docusate sodium 100 MG capsule Commonly known as:  COLACE   loratadine 10 MG tablet Commonly known as:  CLARITIN   metFORMIN 500 MG tablet Commonly known as:  GLUCOPHAGE   metoprolol tartrate 25 MG tablet Commonly known as:  LOPRESSOR   montelukast 10 MG tablet Commonly known as:  SINGULAIR   ondansetron 4 MG tablet Commonly known as:  ZOFRAN   pantoprazole 40 MG tablet Commonly known as:  PROTONIX   polyethylene glycol packet Commonly known as:  MIRALAX / GLYCOLAX   sertraline 100 MG tablet Commonly known as:  ZOLOFT       No Known Allergies  Consultations: Oncology, interventional radiology, palliative care, general surgery  Procedures/Studies: Ct Chest Wo Contrast  Result Date: 11/23/2017 CLINICAL DATA:  Cough, liver metastasis suspected. Patient denies chest pain and dyspnea. EXAM: CT CHEST WITHOUT CONTRAST TECHNIQUE: Multidetector CT imaging of the chest was performed following the standard protocol without IV contrast. COMPARISON:  CT 07/08/2007, CXR 10/15/2017, CT abdomen and pelvis 11/21/2017 FINDINGS: Cardiovascular: Top-normal size heart without pericardial effusion or thickening. Left main in three-vessel coronary arteriosclerosis is noted. There is moderate thoracic aortic atherosclerosis without aneurysm. The main pulmonary artery is dilated up to 3.5 cm compatible with  changes of chronic pulmonary hypertension. Mediastinum/Nodes: Calcified mediastinal lymph nodes compatible with old granulomatous disease. Patent trachea and mainstem bronchi. Gastric tube is seen coursing within the thoracic esophagus and extending into the moderately distended included stomach. Lungs/Pleura: Small left effusion with atelectasis. Scarring and atelectasis at the apices. Superimposed ground-glass opacities in both upper lobes, left greater than right likely representing areas of alveolitis/pneumonitis. No dominant mass is noted. There is no pneumothorax. Upper Abdomen: Small amount of perisplenic ascites. Previously noted ill-defined hepatic masses are less distinct on this unenhanced study. Gallbladder is not included on images provided. Musculoskeletal: No acute nor suspicious osseous abnormality. No aggressive osseous lesions. IMPRESSION: 1. Faint ground-glass infiltrates the upper lobes, left greater greater than right likely representing areas of alveolitis or pneumonitis. Superimposed atelectasis and/or scarring in both upper lobes. 2. No dominant pulmonary mass is seen. Small left effusion with atelectasis. 3. Left main and three-vessel coronary arteriosclerosis. 44. Stigmata of old granulomatous disease with calcified mediastinal lymph nodes. 5. Small amount of perisplenic ascites. Previously noted hepatic lesions are difficult to identify on this unenhanced exam. Aortic Atherosclerosis (ICD10-I70.0). Electronically Signed   By: Ashley Royalty M.D.   On: 11/23/2017 18:40   Ct Abdomen Pelvis W Contrast  Result Date: 11/21/2017 CLINICAL DATA:  Hematemesis. EXAM: CT ABDOMEN AND PELVIS WITH CONTRAST TECHNIQUE: Multidetector CT imaging of the abdomen and pelvis was performed using the standard protocol following bolus administration of intravenous contrast. CONTRAST:  11m ISOVUE-300  IOPAMIDOL (ISOVUE-300) INJECTION 61% COMPARISON:  None. FINDINGS: Lower chest: Mild faint peribronchovascular  ground-glass densities at the lung bases may reflect microaspiration. Prominent distention of the distal esophagus. Lipoma of the interatrial septum. Hepatobiliary: Heterogeneous, ill-defined gallbladder invading the adjacent liver. There are several hypoenhancing lesions in the right liver measuring up to 2.0 cm. Cholelithiasis. Mild central intrahepatic biliary dilatation. There are 3 mm gallstones in the proximal and distal common bile duct, which is nondilated. Pancreas: Unremarkable. No pancreatic ductal dilatation or surrounding inflammatory changes. Spleen: Normal in size without focal abnormality. Adrenals/Urinary Tract: The adrenal glands are unremarkable. Mild bilateral renal atrophy. No focal renal lesion. No renal or ureteral calculi. No hydronephrosis. The bladder is unremarkable. Stomach/Bowel: The stomach is markedly distended with focal narrowing of the first portion of the duodenum due to the gallbladder mass. Remaining small bowel is decompressed. The colon is unremarkable. Vascular/Lymphatic: Aortic atherosclerosis. No enlarged abdominal or pelvic lymph nodes. Reproductive: Calcified fibroid.  No adnexal mass. Other: Trace perihepatic and perisplenic free fluid. No pneumoperitoneum. Diastasis of the right lateral abdominal wall. Musculoskeletal: No acute or significant osseous findings. Degenerative changes of the lumbar spine. IMPRESSION: 1. Heterogeneous, ill-defined gallbladder invading the adjacent liver, consistent with gallbladder carcinoma. Several hepatic metastases measuring up to 2.0 cm. 2. Severe distention of the stomach with gastric outlet obstruction secondary to focal narrowing and invasion of the first portion of the duodenum by the gallbladder mass. 3. Mild central intrahepatic biliary dilatation. Choledocholithiasis with small 3 mm gallstones in the nondilated proximal and distal common bile duct. 4.  Aortic atherosclerosis (ICD10-I70.0). Electronically Signed   By: Titus Dubin M.D.   On: 11/21/2017 20:30   Ir Gastrostomy Tube Mod Sed  Result Date: 11/30/2017 CLINICAL DATA:  Gastric outlet obstruction from gallbladder carcinoma. Percutaneous gastrostomy tube requested for decompression. EXAM: PERCUTANEOUS GASTROSTOMY TUBE PLACEMENT ANESTHESIA/SEDATION: 1.5 mg IV Versed; 50 mcg IV Fentanyl. Total Moderate Sedation Time 15 minutes. The patient's level of consciousness and physiologic status were continuously monitored during the procedure by Radiology nursing. CONTRAST:  20 mL Isovue-300 injected into the stomach. MEDICATIONS: 2 g IV Ancef. IV antibiotic was administered in an appropriate time interval prior to needle puncture of the skin. FLUOROSCOPY TIME:  4 minutes.  177.5 mGy. PROCEDURE: The procedure, risks, benefits, and alternatives were explained to the patient. Questions regarding the procedure were encouraged and answered. The patient understands and consents to the procedure. A 5-French catheter was then advanced through the patient's mouth under fluoroscopy into the esophagus and to the level of the stomach. This catheter was used to insufflate the stomach with air under fluoroscopy. The abdominal wall was prepped with chlorhexidine in a sterile fashion, and a sterile drape was applied covering the operative field. A sterile gown and sterile gloves were used for the procedure. Local anesthesia was provided with 1% Lidocaine. A skin incision was made in the upper abdominal wall. Under fluoroscopy, an 18 gauge trocar needle was advanced into the stomach. Contrast injection was performed to confirm intraluminal position of the needle tip. A single T tack was then deployed in the lumen of the stomach. This was brought up to tension at the skin surface. Over a guidewire, a 9-French sheath was advanced into the lumen of the stomach. The wire was left in place as a safety wire. A loop snare device from a percutaneous gastrostomy kit was then advanced into the stomach. A floppy  guide wire was advanced through the orogastric catheter under fluoroscopy in the stomach.  The loop snare advanced through the percutaneous gastric access was used to snare the guide wire. This allowed withdrawal of the loop snare out of the patient's mouth by retraction of the orogastric catheter and wire. A 20-French bumper retention gastrostomy tube was looped around the snare device. It was then pulled back through the patient's mouth. The retention bumper was brought up to the anterior gastric wall. The T tack suture was cut at the skin. The exiting gastrostomy tube was cut to appropriate length and a feeding adapter applied. The gastrostomy tube was connected to wall suction. The catheter was injected with contrast material to confirm position and a fluoroscopic spot image saved. The tube was then flushed with saline. A dressing was applied over the gastrostomy exit site. COMPLICATIONS: None. FINDINGS: After placement, the tip of the gastrostomy tube lies in the body of the stomach. After gastrostomy tube placement, approximately 700 mL of fluid and debris was able to be aspirated from the gastric lumen after connecting the gastrostomy tube to wall suction. IMPRESSION: Percutaneous gastrostomy with placement of a 20-French bumper retention tube in the body of the stomach. 700 mL of fluid and debris was suctioned from the stomach after gastrostomy tube placement. Electronically Signed   By: Aletta Edouard M.D.   On: 11/30/2017 17:42   US Biopsy (liver)  Result Date: 11/24/2017 INDICATION: Liver lesion is concerning for metastatic disease, suspect gallbladder primary EXAM: ULTRASOUND GUIDED CORE BIOPSY OF THE RIGHT HEPATIC LESION MEDICATIONS: 1% LIDOCAINE LOCAL ANESTHESIA/SEDATION: Versed 1.'0mg'$  IV; Fentanyl 92mg IV; Moderate Sedation Time:  10 MINUTES The patient was continuously monitored during the procedure by the interventional radiology nurse under my direct supervision. FLUOROSCOPY TIME:  Fluoroscopy  Time: None. COMPLICATIONS: None immediate. PROCEDURE: The procedure, risks, benefits, and alternatives were explained to the patient. Questions regarding the procedure were encouraged and answered. The patient understands and consents to the procedure. Previous imaging reviewed. Preliminary ultrasound performed. A hypoechoic solid lesion was localized in the right hepatic dome. Overlying skin marked for a right lateral intercostal approach. Under sterile conditions and local anesthesia, a 17 gauge 11.8 cm access needle was advanced percutaneously to the right hepatic lesion. Needle position confirmed with ultrasound. 18 gauge core biopsies obtained. Images obtained for documentation. Needle tract occluded with Gel-Foam. Postprocedure imaging demonstrates no hemorrhage or hematoma. Patient tolerated the biopsy well. FINDINGS: Ultrasound imaging confirms needle placement to the right hepatic lesion for core biopsy. IMPRESSION: Successful ultrasound right hepatic mass 18 gauge core biopsy Electronically Signed   By: MJerilynn Mages  Shick M.D.   On: 11/24/2017 13:58   Dg UDuanne LimerickW/water Sol Cm  Result Date: 11/26/2017 CLINICAL DATA:  Gastric outlet obstruction. EXAM: WATER SOLUBLE UPPER GI SERIES TECHNIQUE: Single-column upper GI series was performed using water soluble contrast. CONTRAST:  210 cc of Omnipaque 300 injected through nasogastric tube. COMPARISON:  Body CT 11/21/2017 FLUOROSCOPY TIME:  Fluoroscopy Time:  42 seconds FINDINGS: Initial radiograph of the abdomen demonstrates distension of the stomach to the level of the gastric outlet. Enteric catheter terminates in the gastric body. Paucity of gas in the small bowel. Calcified mass in the pelvis may represent a uterine fibroid. 210 cc of Omnipaque 300 were slowly injected through pre-existing nasogastric tube. Opacification of the distended stomach with contrast was seen. Opacification was seen at the level of the pylorus and the proximal aspect of the first portion of the  duodenum. No contrast was seen passing through the duodenum. IMPRESSION: Findings consistent with gastric outlet obstruction at the  level of the first portion of the duodenum. Electronically Signed   By: Fidela Salisbury M.D.   On: 11/26/2017 09:15   Ir Picc Placement Right >5 Yrs Inc Img Guide  Result Date: 11/25/2017 INDICATION: Cataract.  Atrial fibrillation. EXAM: RIGHT UPPER EXTREMITY PICC LINE PLACEMENT WITH ULTRASOUND AND FLUOROSCOPIC GUIDANCE MEDICATIONS: None ANESTHESIA/SEDATION: None FLUOROSCOPY TIME:  Fluoroscopy Time:  minutes 18 seconds (3 mGy). COMPLICATIONS: None immediate. PROCEDURE: The patient was advised of the possible risks and complications and agreed to undergo the procedure. The patient was then brought to the angiographic suite for the procedure. The right arm was prepped with chlorhexidine, draped in the usual sterile fashion using maximum barrier technique (cap and mask, sterile gown, sterile gloves, large sterile sheet, hand hygiene and cutaneous antiseptic). Local anesthesia was attained by infiltration with 1% lidocaine. Ultrasound demonstrated patency of the basilic vein, and this was documented with an image. Under real-time ultrasound guidance, this vein was accessed with a 21 gauge micropuncture needle and image documentation was performed. The needle was exchanged over a guidewire for a peel-away sheath through which a 41 cm 5 [place] double lumen power injectable PICC was advanced, and positioned with its tip at the lower SVC/right atrial junction. Fluoroscopy during the procedure and fluoro spot radiograph confirms appropriate catheter position. The catheter was flushed, secured to the skin with Prolene sutures, and covered with a sterile dressing. IMPRESSION: Successful placement of a right arm PICC with sonographic and fluoroscopic guidance. The catheter is ready for use. Electronically Signed   By: Marybelle Killings M.D.   On: 11/25/2017 13:26   [place] Ekg Site Rite  Result  Date: 11/24/2017 If Site Rite image not attached, placement could not be confirmed due to current cardiac rhythm.      Subjective: Patient seen and examined at bedside this morning.  Remains comfortable.  Denies any complaints.  G tube continues to drain significant amount of fluid.  Discharge Exam: Vitals:   12/02/17 0738 12/02/17 1228  BP: (!) 153/83 (!) 157/47  Pulse: 86 89  Resp: 17 19  Temp: 98.1 F (36.7 C) 98.2 F (36.8 C)  SpO2: 100% 100%   Vitals:   12/01/17 2343 12/02/17 0350 12/02/17 0738 12/02/17 1228  BP: (!) 146/62 (!) 144/66 (!) 153/83 (!) 157/47  Pulse: 91 89 86 89  Resp: (!) 24 (!) '24 17 19  '$ Temp: 98.3 F (36.8 C) 97.7 F (36.5 C) 98.1 F (36.7 C) 98.2 F (36.8 C)  TempSrc: Oral Oral Oral Oral  SpO2: 98% 96% 100% 100%  Weight:      Height:        General: Pt is alert, awake, not in acute distress Cardiovascular: RRR, S1/S2 +, no rubs, no gallops Respiratory: CTA bilaterally, no wheezing, no rhonchi Abdominal: Soft, NT, ND, slow bowel sounds +,G tube Extremities: no edema, no cyanosis    The results of significant diagnostics from this hospitalization (including imaging, microbiology, ancillary and laboratory) are listed below for reference.     Microbiology: No results found for this or any previous visit (from the past 240 hour(s)).   Labs: BNP (last 3 results) Recent Labs    10/15/17 1110  BNP 681.1*   Basic Metabolic Panel: Recent Labs  Lab 11/27/17 0738 11/28/17 0410 11/28/17 0513 11/29/17 0429 11/29/17 2230 11/30/17 0632 12/02/17 0920  NA 135 134* 136 136 135 138 140  K 4.0 6.1* 3.9 4.4 4.2 4.3 4.3  CL 99* 100* 99* 99* 99* 102 102  CO2 29  $'29 30 30 29 29 31  'D$ GLUCOSE 211* 533* 182* 192* 300* 187* 218*  BUN '9 11 11 16 '$ 25* 27* 35*  CREATININE 0.46 0.52 0.48 0.50 0.61 0.48 0.53  CALCIUM 8.2* 8.2* 8.2* 8.4* 8.5* 8.4* 9.1  MG 1.7 2.3  --  1.8 2.0 2.0 2.1  PHOS 3.1 5.4*  --  3.7  --  3.7 4.6   Liver Function Tests: Recent  Labs  Lab 11/26/17 0524 11/30/17 0632  AST 16 17  ALT 15 16  ALKPHOS 116 123  BILITOT 0.8 0.4  PROT 5.2* 5.3*  ALBUMIN 2.5* 2.3*   No results for input(s): LIPASE, AMYLASE in the last 168 hours. No results for input(s): AMMONIA in the last 168 hours. CBC: Recent Labs  Lab 11/28/17 0410 11/29/17 0429 11/29/17 1817 11/29/17 2230 11/30/17 0632 12/01/17 0421  WBC 6.0 6.4 10.7* 10.2 7.9 10.0  NEUTROABS 4.3 4.6 8.0*  --  5.9 7.7  HGB 7.6* 7.7* 11.1* 9.9* 9.6* 10.0*  HCT 23.9* 24.9* 35.0* 31.3* 30.5* 32.2*  MCV 82.7 81.6 81.0 81.3 81.3 83.0  PLT 233 252 340 269 268 281   Cardiac Enzymes: No results for input(s): CKTOTAL, CKMB, CKMBINDEX, TROPONINI in the last 168 hours. BNP: Invalid input(s): POCBNP CBG: Recent Labs  Lab 12/01/17 2005 12/01/17 2341 12/02/17 0338 12/02/17 0808 12/02/17 1228  GLUCAP 151* 167* 181* 192* 188*   D-Dimer No results for input(s): DDIMER in the last 72 hours. Hgb A1c No results for input(s): HGBA1C in the last 72 hours. Lipid Profile Recent Labs    11/30/17 0632  TRIG 115   Thyroid function studies No results for input(s): TSH, T4TOTAL, T3FREE, THYROIDAB in the last 72 hours.  Invalid input(s): FREET3 Anemia work up No results for input(s): VITAMINB12, FOLATE, FERRITIN, TIBC, IRON, RETICCTPCT in the last 72 hours. Urinalysis    Component Value Date/Time   COLORURINE YELLOW 11/22/2017 1130   APPEARANCEUR HAZY (A) 11/22/2017 1130   LABSPEC >1.046 (H) 11/22/2017 1130   PHURINE 5.0 11/22/2017 1130   GLUCOSEU NEGATIVE 11/22/2017 1130   HGBUR NEGATIVE 11/22/2017 1130   BILIRUBINUR NEGATIVE 11/22/2017 1130   KETONESUR 5 (A) 11/22/2017 1130   PROTEINUR NEGATIVE 11/22/2017 1130   NITRITE NEGATIVE 11/22/2017 1130   LEUKOCYTESUR LARGE (A) 11/22/2017 1130   Sepsis Labs Invalid input(s): PROCALCITONIN,  WBC,  LACTICIDVEN Microbiology No results found for this or any previous visit (from the past 240 hour(s)).  Please note: You were  cared for by a hospitalist during your hospital stay. Once you are discharged, your primary care physician will handle any further medical issues. Please note that NO REFILLS for any discharge medications will be authorized once you are discharged, as it is imperative that you return to your primary care physician (or establish a relationship with a primary care physician if you do not have one) for your post hospital discharge needs so that they can reassess your need for medications and monitor your lab values.    Time coordinating discharge: 40 minutes  SIGNED:   Shelly Coss, MD  [facility] 12/02/2017, 1:51 PM Pager [phone]  If 7PM-7AM, please contact night-coverage [email or web address] Password [email or web address]

## 2017-12-02 NOTE — Care Management Note (Addendum)
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  Pt admitted in March - refused SNF and therefore returned home with [facility] for RN and PT.  Pt already has following equipment in the home; rolling walker, cane, transfer/tub bench, grab bars, 3:1, hospital bed and wheelchair.    Expected Discharge Date:  12/02/17               Expected Discharge Plan:  [place]  In-House Referral:     Discharge planning Services  CM Consult  Post Acute Care Choice:    Choice offered to:     DME Arranged:    DME Agency:     HH Arranged:    HH Agency:     Status of Service:     If discussed at [employer], dates discussed:    Additional Comments: 12/02/2017  Daughter will inform bedside nurse when equipment is delivered to home.  Bedside nurse will then release pt for discharge.  Pt will utilize foley for drainage system during transport home per attending.  CM informed that Family/pt has decided to discharge home with hospice - discontinuing TPN and additional procedures.  Family confirmed decision with CM.  Family will provide 24 hour supervision at discharge.  Pt will discharge home today in care of daughter and family.  Pt will transport home via private vehicle per family request.  Family informed CM that the following equipment is already in the home:  Hospital bed, recliner lift chair, bedside commode, wheelchair and cane - family/pt denied needing additional equipment.  CM offered choice of agency - [facility] chosen - CM contacted agency and provided referral.  Per attending; pt will return home with PEG tube returning to suction - [facility] in agreement to provide suction equipment in the home  12/01/17 Pt is now s/p PEG tube - tube is hooked to suction and  currently will not allow nutrition to pass.  Plan is for pt to return to IR on Thursday for attempt to place another tube that will allow for nutrition.  Pt remains on TPN currently.  CM discussed Home with Hospice with daughter - daughter not yet aligned as she wants to continue TPN.  Attending will readdress TPN vs tube feeds.  CM also provided tentative referral to [facility] ( daughter in agreement) for potential Home TPN infusion, nursing aide and registered nurse.   Maryclare Labrador, RN 12/02/2017, 2:19 PM

## 2017-12-02 NOTE — Progress Notes (Signed)
New orders rec'd to d/c TPN and picc line as pt going home with hospice

## 2017-12-19 DEATH — deceased

## 2019-08-21 IMAGING — CT CT ABD-PELV W/ CM
2 of 5 series · 16 of 46 positions shown, 18 images · IV contrast (APPLIED)
Comparison: None.

CLINICAL DATA: Hematemesis.

EXAM:
CT ABDOMEN AND PELVIS WITH CONTRAST
TECHNIQUE: Multidetector CT imaging of the abdomen and pelvis was performed
using the standard protocol following bolus administration of
intravenous contrast.
CONTRAST:  80mL [id number] IOPAMIDOL (RNKAXG-966) INJECTION 61%

[Series 3: abdomen 5.0 · axial · 0.89mm/px · z∈[+892,+1287]mm · 13 of 93 slices shown, 15 images]
[im 7/93  soft-tissue]
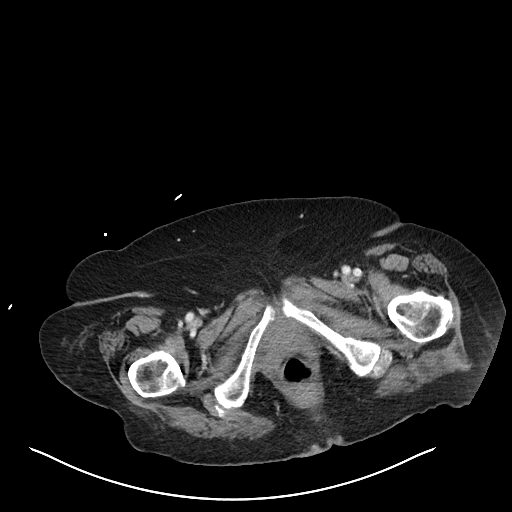
[im 7/93  bone]
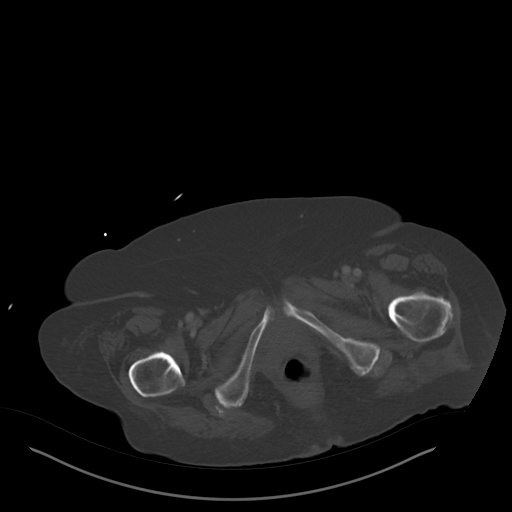
[im 14/93  soft-tissue]
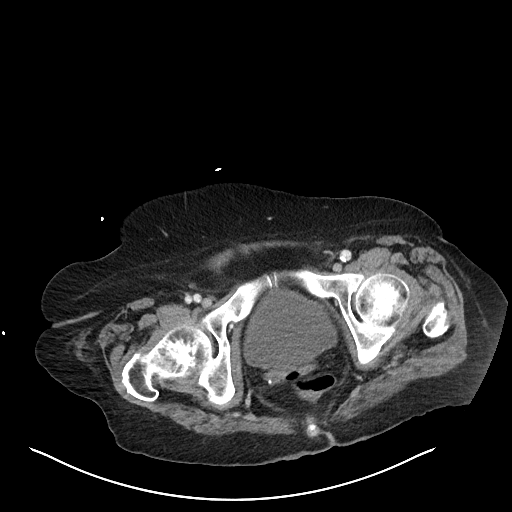
[im 20/93  soft-tissue]
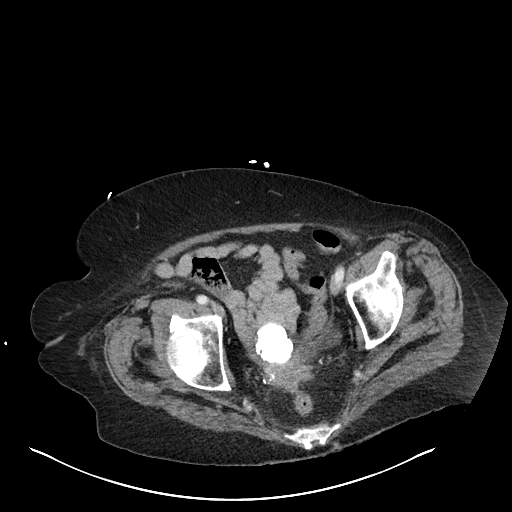
[im 27/93  soft-tissue]
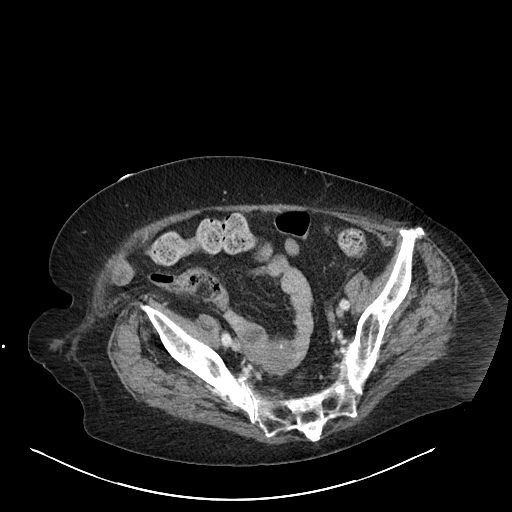
[im 33/93  soft-tissue]
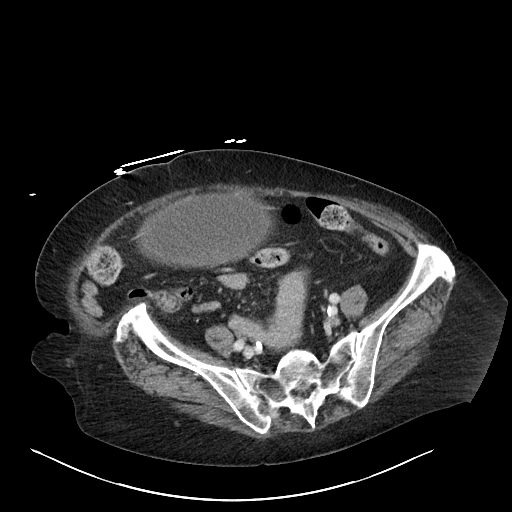
[im 40/93  soft-tissue]
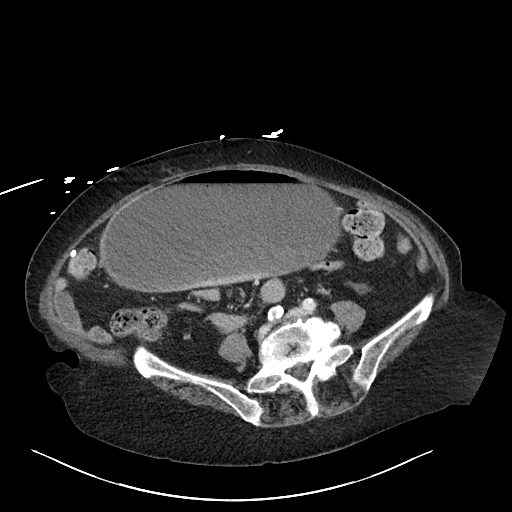
[im 47/93  soft-tissue]
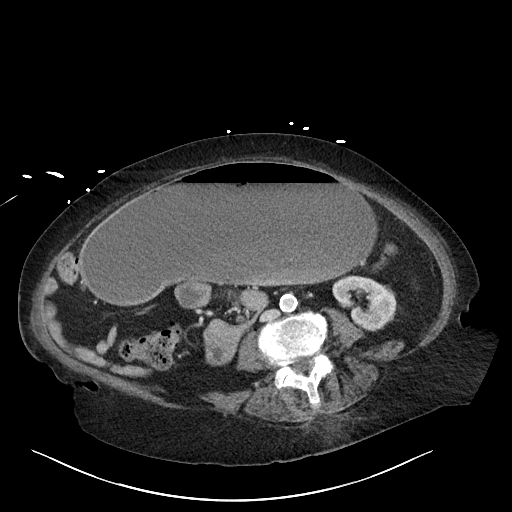
[im 53/93  soft-tissue]
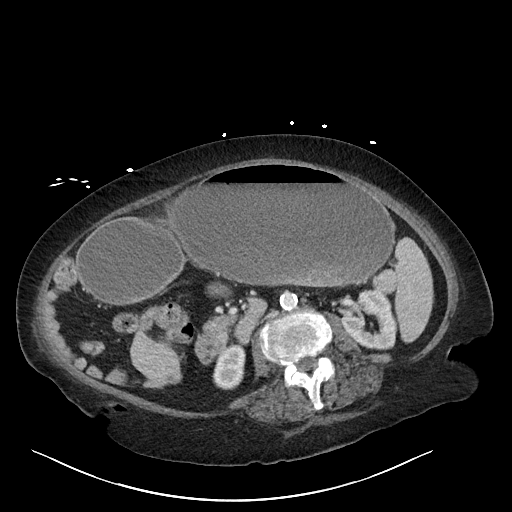
[im 60/93  soft-tissue]
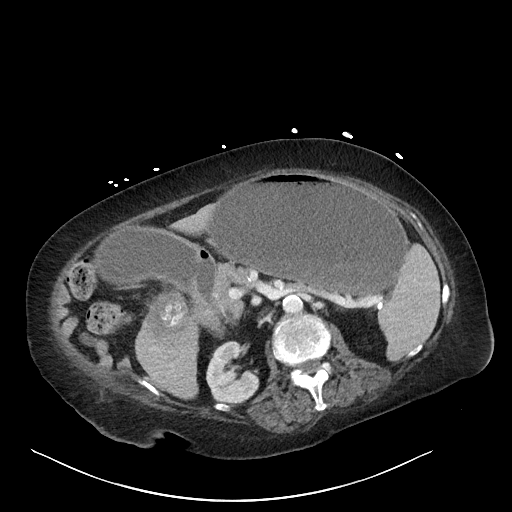
[im 60/93  bone]
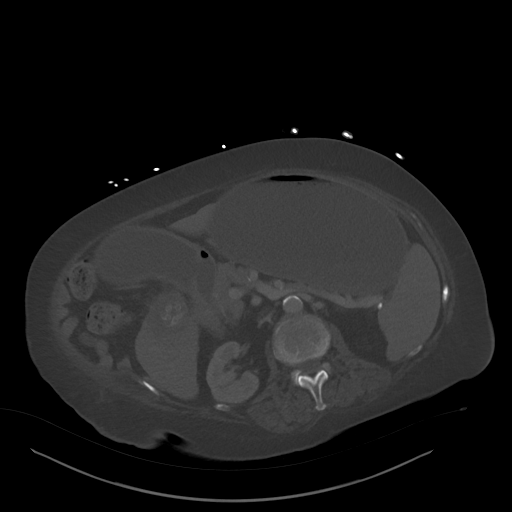
[im 66/93  soft-tissue]
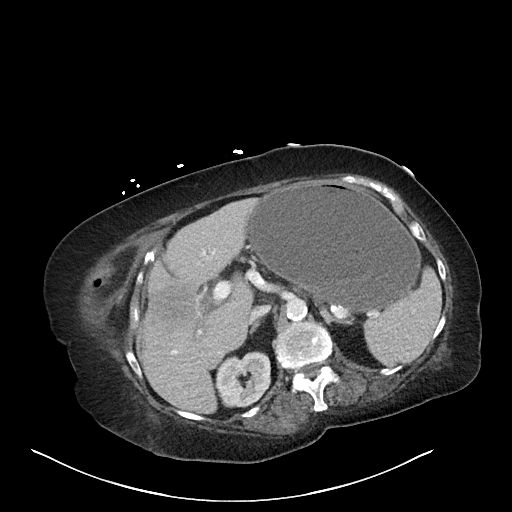
[im 73/93  soft-tissue]
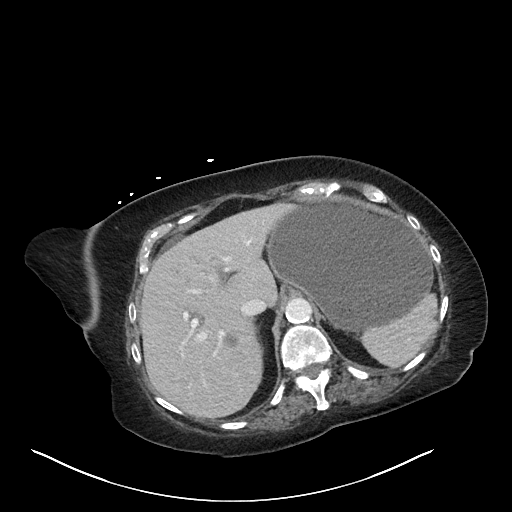
[im 79/93  soft-tissue]
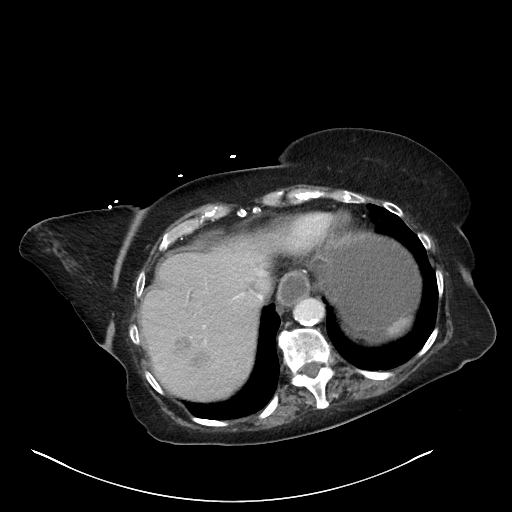
[im 86/93  soft-tissue]
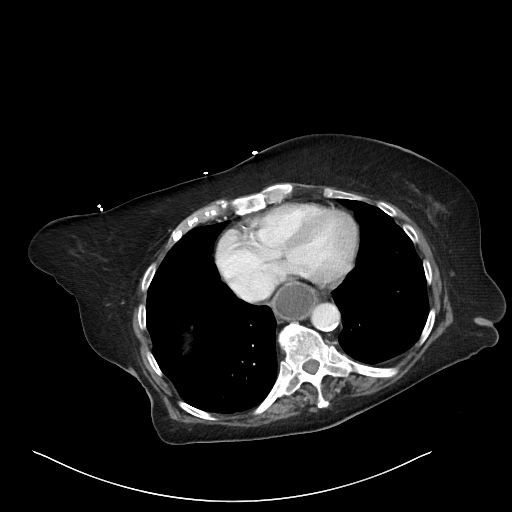

[Series 6: abdomen 3.0 mpr cor · coronal · 0.86mm/px · 3 of 101 slices shown]
[im 34/101  soft-tissue]
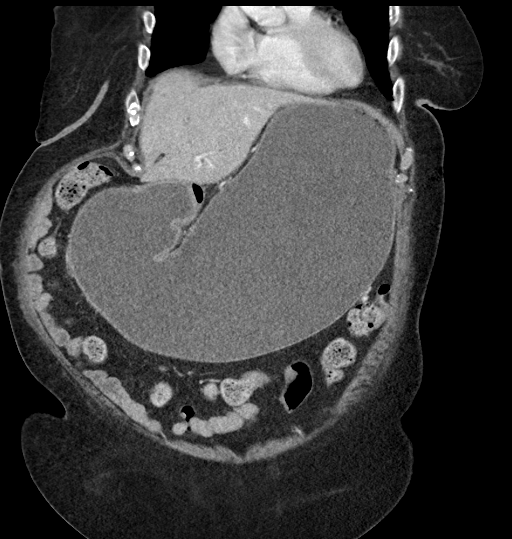
[im 45/101  soft-tissue]
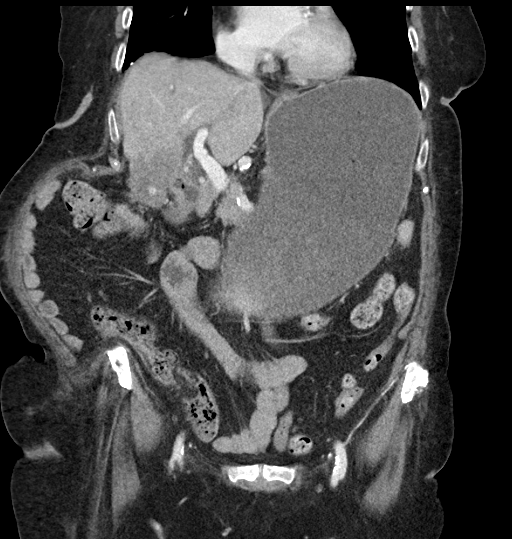
[im 56/101  soft-tissue]
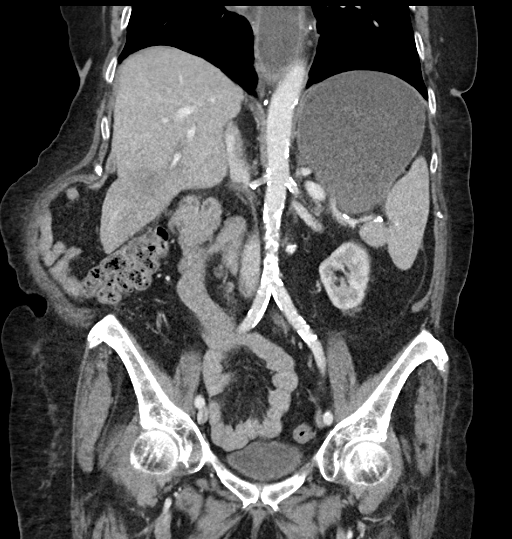

[16 of 46 positions shown; findings below may reference images not displayed]

FINDINGS: Lower chest: Mild faint peribronchovascular ground-glass densities
at the lung bases may reflect microaspiration. Prominent distention
of the distal esophagus. Lipoma of the interatrial septum.

Hepatobiliary: Heterogeneous, ill-defined gallbladder invading the
adjacent liver. There are several hypoenhancing lesions in the right
liver measuring up to 2.0 cm. Cholelithiasis. Mild central
intrahepatic biliary dilatation. There are 3 mm gallstones in the
proximal and distal common bile duct, which is nondilated.

Pancreas: Unremarkable. No pancreatic ductal dilatation or
surrounding inflammatory changes.

Spleen: Normal in size without focal abnormality.

Adrenals/Urinary Tract: The adrenal glands are unremarkable. Mild
bilateral renal atrophy. No focal renal lesion. No renal or ureteral
calculi. No hydronephrosis. The bladder is unremarkable.

Stomach/Bowel: The stomach is markedly distended with focal
narrowing of the first portion of the duodenum due to the
gallbladder mass. Remaining small bowel is decompressed. The colon
is unremarkable.

Vascular/Lymphatic: Aortic atherosclerosis. No enlarged abdominal or
pelvic lymph nodes.

Reproductive: Calcified fibroid.  No adnexal mass.

Other: Trace perihepatic and perisplenic free fluid. No
pneumoperitoneum. Diastasis of the right lateral abdominal wall.

Musculoskeletal: No acute or significant osseous findings.
Degenerative changes of the lumbar spine.
IMPRESSION: 1. Heterogeneous, ill-defined gallbladder invading the adjacent
liver, consistent with gallbladder carcinoma. Several hepatic
metastases measuring up to 2.0 cm.
2. Severe distention of the stomach with gastric outlet obstruction
secondary to focal narrowing and invasion of the first portion of
the duodenum by the gallbladder mass.
3. Mild central intrahepatic biliary dilatation. Choledocholithiasis
with small 3 mm gallstones in the nondilated proximal and distal
common bile duct.
4.  Aortic atherosclerosis ([id number]).
